# Patient Record
Sex: Male | Born: 2000 | Race: Black or African American | Hispanic: No | Marital: Single | Smoking: Never smoker
Health system: Southern US, Community
[De-identification: ages and names within clinical notes are randomized; demographics above are authoritative.]

## PROBLEM LIST (undated history)

## (undated) DIAGNOSIS — F988 Other specified behavioral and emotional disorders with onset usually occurring in childhood and adolescence: Secondary | ICD-10-CM

## (undated) DIAGNOSIS — G0490 Encephalitis and encephalomyelitis, unspecified: Secondary | ICD-10-CM

## (undated) HISTORY — PX: CIRCUMCISION: SUR203

---
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
## 2022-01-17 ENCOUNTER — Ambulatory Visit: Payer: Self-pay | Admitting: Internal Medicine

## 2022-11-15 ENCOUNTER — Encounter (HOSPITAL_COMMUNITY): Payer: Self-pay | Admitting: *Deleted

## 2022-11-15 ENCOUNTER — Ambulatory Visit (HOSPITAL_COMMUNITY)
Admission: EM | Admit: 2022-11-15 | Discharge: 2022-11-15 | Disposition: A | Discharge status: Home / Self Care | Payer: BC Managed Care – PPO | Source: Home / Self Care | Attending: Family Medicine | Admitting: Family Medicine

## 2022-11-15 DIAGNOSIS — R519 Headache, unspecified: Secondary | ICD-10-CM | POA: Insufficient documentation

## 2022-11-15 DIAGNOSIS — R4182 Altered mental status, unspecified: Secondary | ICD-10-CM | POA: Diagnosis not present

## 2022-11-15 DIAGNOSIS — G0481 Other encephalitis and encephalomyelitis: Secondary | ICD-10-CM | POA: Diagnosis not present

## 2022-11-15 DIAGNOSIS — R030 Elevated blood-pressure reading, without diagnosis of hypertension: Secondary | ICD-10-CM

## 2022-11-15 LAB — CBC
HCT: 49 % (ref 39.0–52.0)
Hemoglobin: 16.6 g/dL (ref 13.0–17.0)
MCH: 27 pg (ref 26.0–34.0)
MCHC: 33.9 g/dL (ref 30.0–36.0)
MCV: 79.8 fL — ABNORMAL LOW (ref 80.0–100.0)
Platelets: 364 10*3/uL (ref 150–400)
RBC: 6.14 MIL/uL — ABNORMAL HIGH (ref 4.22–5.81)
RDW: 12 % (ref 11.5–15.5)
WBC: 6 10*3/uL (ref 4.0–10.5)
nRBC: 0 % (ref 0.0–0.2)

## 2022-11-15 LAB — BASIC METABOLIC PANEL
Anion gap: 12 (ref 5–15)
BUN: 6 mg/dL (ref 6–20)
CO2: 27 mmol/L (ref 22–32)
Calcium: 9.7 mg/dL (ref 8.9–10.3)
Chloride: 100 mmol/L (ref 98–111)
Creatinine, Ser: 1.17 mg/dL (ref 0.61–1.24)
GFR, Estimated: >60 mL/min (ref >60)
Glucose, Bld: 137 mg/dL — ABNORMAL HIGH (ref 70–99)
Potassium: 3.2 mmol/L — ABNORMAL LOW (ref 3.5–5.1)
Sodium: 139 mmol/L (ref 135–145)

## 2022-11-15 NOTE — ED Triage Notes (Signed)
Pt states he has had a headache, blurry vision, and he can't get an erection since Monday. He has taken tylenol as needed. He states with tylenol headache is completely gone, which he did this morning.

## 2022-11-15 NOTE — ED Provider Notes (Signed)
[facility]    CSN: [id number] Arrival date & time: 11/15/22  4098      History   Chief Complaint Chief Complaint  Patient presents with   Headache   Eye Problem    HPI Colton Zhang is a 22 y.o. male.    Headache Eye Problem Associated symptoms: headaches    Here for headache and blurry vision.  He states the headache started on June 17.  This morning about 3 or 4 hours ago he took some Tylenol and it made the headache go away.  He had some associated nausea with it and it was on his right side of his head.  It was throbbing.  No photophobia  He has just seen his eye doctor 2 or 3 weeks ago for contacts, patient states that when the headache was going on the wood experiencing blurry vision when he tried to focus on something.    No vomiting or diarrhea.  He also notes at the end of the history that he has had trouble not able to get an erection in the last few days while the headache was going on.  Past Medical History:  Diagnosis Date   Asthma     Patient Active Problem List   Diagnosis Date Noted   Headache(784.0) 09/16/2013   Migraine without aura 09/16/2013   ADD (attention deficit disorder) 09/16/2013   Transient alteration of awareness 09/16/2013    Past Surgical History:  Procedure Laterality Date   CIRCUMCISION         Home Medications    Prior to Admission medications   Not on File    Family History Family History  Problem Relation Age of Onset   Cirrhosis Father    Migraines Brother    Heart failure Paternal Grandmother    Stroke Paternal Grandfather     Social History Social History   Tobacco Use   Smoking status: Never   Smokeless tobacco: Never  Vaping Use   Vaping Use: Never used  Substance Use Topics   Alcohol use: Never   Drug use: Never     Allergies   Other   Review of Systems Review of Systems  Neurological:  Positive for headaches.     Physical Exam Triage Vital Signs ED Triage Vitals   Enc Vitals Group     BP 11/15/22 1018 (!) 190/104     Pulse Rate 11/15/22 1018 93     Resp 11/15/22 1018 18     Temp 11/15/22 1018 98.5 F (36.9 C)     Temp Source 11/15/22 1018 Oral     SpO2 11/15/22 1018 96 %     Weight --      Height --      Head Circumference --      Peak Flow --      Pain Score 11/15/22 1016 0     Pain Loc --      Pain Edu? --      Excl. in GC? --    No data found.  Updated Vital Signs BP (!) 190/104 (BP Location: Left Arm)   Pulse 93   Temp 98.5 F (36.9 C) (Oral)   Resp 18   SpO2 96%   Visual Acuity Right Eye Distance: 20/25 Left Eye Distance: 20/30 Bilateral Distance: 20/30  Right Eye Near:   Left Eye Near:    Bilateral Near:     Physical Exam Vitals reviewed.  Constitutional:      General: He is  not in acute distress.    Appearance: He is not ill-appearing, toxic-appearing or diaphoretic.  HENT:     Nose: Nose normal.     Mouth/Throat:     Mouth: Mucous membranes are moist.     Pharynx: No oropharyngeal exudate or posterior oropharyngeal erythema.  Eyes:     Extraocular Movements: Extraocular movements intact.     Conjunctiva/sclera: Conjunctivae normal.     Pupils: Pupils are equal, round, and reactive to light.  Cardiovascular:     Rate and Rhythm: Normal rate and regular rhythm.     Heart sounds: No murmur heard. Pulmonary:     Effort: Pulmonary effort is normal. No respiratory distress.     Breath sounds: Normal breath sounds. No stridor. No wheezing, rhonchi or rales.  Musculoskeletal:     Cervical back: Neck supple.  Lymphadenopathy:     Cervical: No cervical adenopathy.  Skin:    Capillary Refill: Capillary refill takes less than 2 seconds.     Coloration: Skin is not jaundiced or pale.  Neurological:     General: No focal deficit present.     Mental Status: He is alert and oriented to person, place, and time.  Psychiatric:        Behavior: Behavior normal.      UC Treatments / Results  Labs (all labs ordered  are listed, but only abnormal results are displayed) Labs Reviewed - No data to display  EKG   Radiology No results found.  Procedures Procedures (including critical care time)  Medications Ordered in UC Medications - No data to display  Initial Impression / Assessment and Plan / UC Course  I have reviewed the triage vital signs and the nursing notes.  Pertinent labs & imaging results that were available during my care of the patient were reviewed by me and considered in my medical decision making (see chart for details).        CBC and BMP are drawn today.  Staff will notify him of any abnormal maladies that are significant.  He has had to change primary care offices due to insurance concerns, but has yet to make an appointment with a new primary care.  Of asked him to do that today so he can follow-up his blood pressure and these other complaints.  I did discuss with him that I think he perhaps had a migraine headache, but no further intervention for that is needed today since it is better.  He can check his blood pressure some at the store and keep a log of that. Final Clinical Impressions(s) / UC Diagnoses   Final diagnoses:  None   Discharge Instructions   None    ED Prescriptions   None    PDMP not reviewed this encounter.   Zenia Resides, MD 11/15/22 [phone]

## 2022-11-15 NOTE — Discharge Instructions (Signed)
You can continue to take Tylenol as needed for headache.  We have drawn blood to check your blood counts and your sugar, electrolytes, and kidney function.  Staff will notify you of anything significantly abnormal  You can check your blood pressure some at the pharmacy or store.  Normal blood pressure is less than 140/90.    Please make an appointment with your new primary care office sometime soon

## 2022-11-17 ENCOUNTER — Other Ambulatory Visit: Payer: Self-pay

## 2022-11-17 ENCOUNTER — Emergency Department (HOSPITAL_COMMUNITY): Payer: BC Managed Care – PPO

## 2022-11-17 ENCOUNTER — Inpatient Hospital Stay (HOSPITAL_COMMUNITY)
Admission: EM | Admit: 2022-11-17 | Discharge: 2022-11-27 | DRG: 097 | Disposition: A | Discharge status: Other Acute Inpatient Hospital | Payer: BC Managed Care – PPO | Source: Ambulatory Visit | Attending: Pulmonary Disease | Admitting: Pulmonary Disease

## 2022-11-17 ENCOUNTER — Encounter (HOSPITAL_COMMUNITY): Payer: Self-pay | Admitting: *Deleted

## 2022-11-17 ENCOUNTER — Ambulatory Visit (HOSPITAL_COMMUNITY)
Admission: EM | Admit: 2022-11-17 | Discharge: 2022-11-17 | Disposition: A | Discharge status: Home / Self Care | Payer: BC Managed Care – PPO | Attending: Internal Medicine | Admitting: Internal Medicine

## 2022-11-17 DIAGNOSIS — I160 Hypertensive urgency: Secondary | ICD-10-CM | POA: Diagnosis present

## 2022-11-17 DIAGNOSIS — R718 Other abnormality of red blood cells: Secondary | ICD-10-CM | POA: Diagnosis present

## 2022-11-17 DIAGNOSIS — Z823 Family history of stroke: Secondary | ICD-10-CM

## 2022-11-17 DIAGNOSIS — E870 Hyperosmolality and hypernatremia: Secondary | ICD-10-CM | POA: Diagnosis not present

## 2022-11-17 DIAGNOSIS — E8770 Fluid overload, unspecified: Secondary | ICD-10-CM | POA: Diagnosis not present

## 2022-11-17 DIAGNOSIS — T4275XA Adverse effect of unspecified antiepileptic and sedative-hypnotic drugs, initial encounter: Secondary | ICD-10-CM | POA: Diagnosis not present

## 2022-11-17 DIAGNOSIS — I952 Hypotension due to drugs: Secondary | ICD-10-CM | POA: Diagnosis not present

## 2022-11-17 DIAGNOSIS — R001 Bradycardia, unspecified: Secondary | ICD-10-CM | POA: Diagnosis not present

## 2022-11-17 DIAGNOSIS — G0481 Other encephalitis and encephalomyelitis: Principal | ICD-10-CM | POA: Diagnosis present

## 2022-11-17 DIAGNOSIS — R197 Diarrhea, unspecified: Secondary | ICD-10-CM | POA: Diagnosis not present

## 2022-11-17 DIAGNOSIS — N179 Acute kidney failure, unspecified: Secondary | ICD-10-CM | POA: Diagnosis not present

## 2022-11-17 DIAGNOSIS — F909 Attention-deficit hyperactivity disorder, unspecified type: Secondary | ICD-10-CM | POA: Diagnosis present

## 2022-11-17 DIAGNOSIS — J45909 Unspecified asthma, uncomplicated: Secondary | ICD-10-CM | POA: Diagnosis present

## 2022-11-17 DIAGNOSIS — G40901 Epilepsy, unspecified, not intractable, with status epilepticus: Secondary | ICD-10-CM | POA: Diagnosis present

## 2022-11-17 DIAGNOSIS — G40911 Epilepsy, unspecified, intractable, with status epilepticus: Secondary | ICD-10-CM | POA: Diagnosis present

## 2022-11-17 DIAGNOSIS — Z8249 Family history of ischemic heart disease and other diseases of the circulatory system: Secondary | ICD-10-CM

## 2022-11-17 DIAGNOSIS — L739 Follicular disorder, unspecified: Secondary | ICD-10-CM | POA: Diagnosis present

## 2022-11-17 DIAGNOSIS — J690 Pneumonitis due to inhalation of food and vomit: Secondary | ICD-10-CM | POA: Diagnosis not present

## 2022-11-17 DIAGNOSIS — R4789 Other speech disturbances: Secondary | ICD-10-CM | POA: Diagnosis not present

## 2022-11-17 DIAGNOSIS — Z79899 Other long term (current) drug therapy: Secondary | ICD-10-CM

## 2022-11-17 DIAGNOSIS — I10 Essential (primary) hypertension: Secondary | ICD-10-CM | POA: Diagnosis present

## 2022-11-17 DIAGNOSIS — E871 Hypo-osmolality and hyponatremia: Secondary | ICD-10-CM | POA: Diagnosis not present

## 2022-11-17 DIAGNOSIS — G43909 Migraine, unspecified, not intractable, without status migrainosus: Secondary | ICD-10-CM | POA: Diagnosis present

## 2022-11-17 DIAGNOSIS — J9601 Acute respiratory failure with hypoxia: Secondary | ICD-10-CM | POA: Diagnosis present

## 2022-11-17 DIAGNOSIS — A86 Unspecified viral encephalitis: Secondary | ICD-10-CM | POA: Diagnosis present

## 2022-11-17 DIAGNOSIS — G0490 Encephalitis and encephalomyelitis, unspecified: Principal | ICD-10-CM

## 2022-11-17 HISTORY — DX: Other specified behavioral and emotional disorders with onset usually occurring in childhood and adolescence: F98.8

## 2022-11-17 LAB — AMMONIA: Ammonia: 15 umol/L (ref 9–35)

## 2022-11-17 LAB — URINALYSIS, ROUTINE W REFLEX MICROSCOPIC
Bacteria, UA: NONE SEEN
Bilirubin Urine: NEGATIVE
Glucose, UA: NEGATIVE mg/dL
Hgb urine dipstick: NEGATIVE
Ketones, ur: NEGATIVE mg/dL
Leukocytes,Ua: NEGATIVE
Nitrite: NEGATIVE
Protein, ur: 100 mg/dL — AB
Specific Gravity, Urine: 1.012 (ref 1.005–1.030)
pH: 6 (ref 5.0–8.0)

## 2022-11-17 LAB — COMPREHENSIVE METABOLIC PANEL
ALT: 38 U/L (ref 0–44)
AST: 31 U/L (ref 15–41)
Albumin: 4.6 g/dL (ref 3.5–5.0)
Alkaline Phosphatase: 79 U/L (ref 38–126)
Anion gap: 14 (ref 5–15)
BUN: 11 mg/dL (ref 6–20)
CO2: 27 mmol/L (ref 22–32)
Calcium: 9.7 mg/dL (ref 8.9–10.3)
Chloride: 94 mmol/L — ABNORMAL LOW (ref 98–111)
Creatinine, Ser: 1.17 mg/dL (ref 0.61–1.24)
GFR, Estimated: >60 mL/min (ref >60)
Glucose, Bld: 132 mg/dL — ABNORMAL HIGH (ref 70–99)
Potassium: 3.7 mmol/L (ref 3.5–5.1)
Sodium: 135 mmol/L (ref 135–145)
Total Bilirubin: 0.8 mg/dL (ref 0.3–1.2)
Total Protein: 8.4 g/dL — ABNORMAL HIGH (ref 6.5–8.1)

## 2022-11-17 LAB — CBC
HCT: 51 % (ref 39.0–52.0)
Hemoglobin: 17 g/dL (ref 13.0–17.0)
MCH: 26.1 pg (ref 26.0–34.0)
MCHC: 33.3 g/dL (ref 30.0–36.0)
MCV: 78.2 fL — ABNORMAL LOW (ref 80.0–100.0)
Platelets: 380 10*3/uL (ref 150–400)
RBC: 6.52 MIL/uL — ABNORMAL HIGH (ref 4.22–5.81)
RDW: 11.6 % (ref 11.5–15.5)
WBC: 8.1 10*3/uL (ref 4.0–10.5)
nRBC: 0 % (ref 0.0–0.2)

## 2022-11-17 LAB — LIPASE, BLOOD: Lipase: 29 U/L (ref 11–51)

## 2022-11-17 LAB — POCT FASTING CBG KUC MANUAL ENTRY: POCT Glucose (KUC): 145 mg/dL — AB (ref 70–99)

## 2022-11-17 LAB — RAPID URINE DRUG SCREEN, HOSP PERFORMED
Amphetamines: NOT DETECTED
Barbiturates: NOT DETECTED
Benzodiazepines: NOT DETECTED
Cocaine: NOT DETECTED
Opiates: NOT DETECTED
Tetrahydrocannabinol: NOT DETECTED

## 2022-11-17 LAB — SALICYLATE LEVEL: Salicylate Lvl: <7 mg/dL — ABNORMAL LOW (ref 7.0–30.0)

## 2022-11-17 LAB — ACETAMINOPHEN LEVEL: Acetaminophen (Tylenol), Serum: <10 ug/mL — ABNORMAL LOW (ref 10–30)

## 2022-11-17 LAB — CBG MONITORING, ED: Glucose-Capillary: 122 mg/dL — ABNORMAL HIGH (ref 70–99)

## 2022-11-17 LAB — TSH: TSH: 1.504 u[IU]/mL (ref 0.350–4.500)

## 2022-11-17 MED ORDER — LORAZEPAM 2 MG/ML IJ SOLN
0.5000 mg | Freq: Once | INTRAMUSCULAR | Status: AC
  Administered 2022-11-17: 0.5 mg via INTRAVENOUS
  Filled 2022-11-17: qty 1

## 2022-11-17 MED ORDER — LIDOCAINE-EPINEPHRINE (PF) 2 %-1:200000 IJ SOLN
20.0000 mL | Freq: Once | INTRAMUSCULAR | Status: AC
  Administered 2022-11-18: 20 mL via INTRADERMAL
  Filled 2022-11-17: qty 20

## 2022-11-17 MED ORDER — DEXTROSE 5 % IV SOLN
500.0000 mg | Freq: Once | INTRAVENOUS | Status: AC
  Administered 2022-11-18: 500 mg via INTRAVENOUS
  Filled 2022-11-17: qty 10

## 2022-11-17 MED ORDER — VANCOMYCIN HCL 1500 MG/300ML IV SOLN
1500.0000 mg | Freq: Once | INTRAVENOUS | Status: AC
  Administered 2022-11-17: 1500 mg via INTRAVENOUS
  Filled 2022-11-17: qty 300

## 2022-11-17 MED ORDER — LORAZEPAM 2 MG/ML IJ SOLN
0.5000 mg | Freq: Once | INTRAMUSCULAR | Status: AC
  Administered 2022-11-18: 0.5 mg via INTRAVENOUS
  Filled 2022-11-17: qty 1

## 2022-11-17 MED ORDER — SODIUM CHLORIDE 0.9 % IV SOLN
2.0000 g | Freq: Two times a day (BID) | INTRAVENOUS | Status: DC
  Administered 2022-11-17: 2 g via INTRAVENOUS
  Filled 2022-11-17 (×2): qty 20

## 2022-11-17 MED ORDER — GADOBUTROL 1 MMOL/ML IV SOLN
7.5000 mL | Freq: Once | INTRAVENOUS | Status: AC | PRN
  Administered 2022-11-17: 7.5 mL via INTRAVENOUS

## 2022-11-17 MED ORDER — PROCHLORPERAZINE EDISYLATE 10 MG/2ML IJ SOLN
5.0000 mg | Freq: Once | INTRAMUSCULAR | Status: AC
  Administered 2022-11-17: 5 mg via INTRAVENOUS
  Filled 2022-11-17: qty 2

## 2022-11-17 MED ORDER — SODIUM CHLORIDE 0.9 % IV BOLUS
500.0000 mL | Freq: Once | INTRAVENOUS | Status: AC
  Administered 2022-11-17: 500 mL via INTRAVENOUS

## 2022-11-17 MED ORDER — DEXTROSE 5 % IV SOLN
500.0000 mg | Freq: Three times a day (TID) | INTRAVENOUS | Status: DC
  Filled 2022-11-17 (×5): qty 10

## 2022-11-17 NOTE — ED Triage Notes (Signed)
The pt thinks he's hving a stroke because his bp is high and he reports that he has been stuttering  a few minutes later the pt began to look staright at me and starts mumbling  after approx 15 seconds he clearly states I'm ok now with normal speech  no headache

## 2022-11-17 NOTE — ED Notes (Signed)
I found pt walking down hall to the lobby with unsteady gait. Pt walked into wall and said his vision was blurry. Pt altered and said he did not know why he was walking down the hall or where he was going when I asked. I brought pt back to bed. Family at bedside. Pt's nurse notified.

## 2022-11-17 NOTE — ED Notes (Signed)
Patient is being discharged from the Urgent Care and sent to the Emergency Department via POV . Per Juliet Rude, patient is in need of higher level of care due to Altered mental status. Patient is aware and verbalizes understanding of plan of care.  Vitals:   11/17/22 1524  BP: (!) 174/110  Pulse: 94  Resp: 18  Temp: 98.6 F (37 C)  SpO2: 97%

## 2022-11-17 NOTE — ED Provider Notes (Signed)
[place] EM[facility]GENCY DEPARTMENT AT [facility] Provider Note   CSN: [id number] Arrival date & time: 11/17/22  1620     History {Add pertinent medical, surgical, social history, OB history to HPI:1} Chief Complaint  Patient presents with   he thinks hes having a stroke   Hypertension from [facility]    Devarius Nelles is a 22 y.o. male with a history of chronic headaches who presents emergency department with altered mental status.  History is gathered at bedside from the patient's brother and godparents who are with him.  He was seen earlier today at the urgent care and sent in for further evaluation.  His brother reports that he has been "dealing with high blood pressure issues" this week.  His godparents are concerned because he is confused.  They state that "he did not even recognize [place]."  The nurse reports that the patient was up and was trying to walk out of the ER but did not know where he was and was redirected back to his bed.  His brother reports that he does not use drugs.  He has not been hallucinating or altered at home.  Patient reports that he has had worsening and increased headaches over the past few weeks and this past Monday he had a severe throbbing headache with associated nausea on the 21st for which he was seen at an urgent care.  Mother reports that he has been complaining of blurry vision and this is the first time that he has been confused.  He has not shown any other abnormal neurologic deficits such as ataxia dizziness or stumbling.  He has not exhibited any facial droop or unilateral weakness.  His brother reports that he was having some stuttering speech earlier today.  Reviewed the urgent care visit.  Neurology was consulted and states that he does not meet qualifications for a code stroke.  HPI     Home Medications Prior to Admission medications   Not on File      Allergies    Other    Review of Systems   Review of Systems  Physical Exam Updated  Vital Signs BP (!) 171/117 (BP Location: Right Arm)   Pulse 97   Temp 99.9 F (37.7 C) (Oral)   Resp 17   Ht 5' (1.524 m)   Wt 75.6 kg   SpO2 100%   BMI 32.55 kg/m  Physical Exam Vitals and nursing note reviewed.  Constitutional:      General: He is not in acute distress.    Appearance: He is well-developed. He is not diaphoretic.  HENT:     Head: Normocephalic and atraumatic.  Eyes:     General: No scleral icterus.    Conjunctiva/sclera: Conjunctivae normal.  Cardiovascular:     Rate and Rhythm: Normal rate and regular rhythm.     Heart sounds: Normal heart sounds.  Pulmonary:     Effort: Pulmonary effort is normal. No respiratory distress.     Breath sounds: Normal breath sounds.  Abdominal:     Palpations: Abdomen is soft.     Tenderness: There is no abdominal tenderness.  Musculoskeletal:     Cervical back: Normal range of motion and neck supple.  Skin:    General: Skin is warm and dry.  Neurological:     Mental Status: He is alert.     GCS: GCS eye subscore is 4. GCS verbal subscore is 5. GCS motor subscore is 6.     Sensory: Sensation  is intact.     Motor: Motor function is intact.     Coordination: Coordination is intact.     Gait: Gait is intact.     Deep Tendon Reflexes: Reflexes are normal and symmetric.     Comments: Patient initially states that the year is 2024 but then was able to state that it was 2024.  No other disorientation  Psychiatric:        Behavior: Behavior normal.     ED Results / Procedures / Treatments   Labs (all labs ordered are listed, but only abnormal results are displayed) Labs Reviewed  COMPREHENSIVE METABOLIC PANEL - Abnormal; Notable for the following components:      Result Value   Chloride 94 (*)    Glucose, Bld 132 (*)    Total Protein 8.4 (*)    All other components within normal limits  CBC - Abnormal; Notable for the following components:   RBC 6.52 (*)    MCV 78.2 (*)    All other components within normal limits   URINALYSIS, ROUTINE W REFLEX MICROSCOPIC - Abnormal; Notable for the following components:   Protein, ur 100 (*)    All other components within normal limits  LIPASE, BLOOD  AMMONIA  RAPID URINE DRUG SCREEN, HOSP PERFORMED  ACETAMINOPHEN LEVEL  SALICYLATE LEVEL  TSH  CBG MONITORING, ED    EKG EKG Interpretation  Date/Time:  Sunday November 17 2022 16:28:05 EDT Ventricular Rate:  100 PR Interval:  128 QRS Duration: 76 QT Interval:  322 QTC Calculation: 415 R Axis:   70 Text Interpretation: Normal sinus rhythm Right atrial enlargement Borderline ECG When compared with ECG of 17-Nov-2022 15:34, No significant change since last tracing Confirmed by Linwood Dibbles [phone]) on 11/17/2022 5:48:33 PM  Radiology No results found.  Procedures Procedures  {Document cardiac monitor, telemetry assessment procedure when appropriate:1}  Medications Ordered in ED Medications - No data to display  ED Course/ Medical Decision Making/ A&P Clinical Course as of 11/17/22 1823  Sun Nov 17, 2022  1822 Glucose(!): 132 [AH]    Clinical Course User Index [AH] Arthor Captain, PA-C   {   Click here for ABCD2, HEART and other calculatorsREFRESH Note before signing :1}                          Medical Decision Making Amount and/or Complexity of Data Reviewed Labs: ordered. Decision-making details documented in ED Course. Radiology: ordered.  Risk Prescription drug management.   ***  {Document critical care time when appropriate:1} {Document review of labs and clinical decision tools ie heart score, Chads2Vasc2 etc:1}  {Document your independent review of radiology images, and any outside records:1} {Document your discussion with family members, caretakers, and with consultants:1} {Document social determinants of health affecting pt's care:1} {Document your decision making why or why not admission, treatments were needed:1} Final Clinical Impression(s) / ED Diagnoses Final diagnoses:  None     Rx / DC Orders ED Discharge Orders     None

## 2022-11-17 NOTE — ED Notes (Signed)
Pt given urine cup stated "if I can't get water, I can't provide urine".

## 2022-11-17 NOTE — ED Triage Notes (Signed)
The pt rep[orts that he thinks he's having a stroke  his bp is high  [facility] and was told to come here for tyreatmnet   he does not take med for hypertension

## 2022-11-17 NOTE — ED Provider Notes (Signed)
Patient presents to urgent care with brother for evaluation of intermittent chest pain, shortness of breath, headache, numbness/tingling to the right arm, weakness of the right arm, and blurry vision for the last 1 week.  No trauma/injuries to the head. He was seen 2 days ago at urgent care for headache where blood work was normal and he was advised to return to the ER if any worsening symptoms. 1 hour ago, he started having some changes in his speech and stuttering that brother states is new for him.  Brother also states he has had some changes in gait over the last 24 hours. Denies recent drug use, non-smoker.  Reports intermittent dizziness with position changes and standing.  He is not currently experiencing any nausea, vomiting, chest pain, heart palpitations, shortness of breath, abdominal pain, vision changes, tinnitus, arm weakness, or lightheadedness.  No recent syncopal episodes.  States his paternal grandfather and one of his paternal uncles died of stroke at a young age (61 and 62s respectively).  Patient has had high blood pressure readings for the last 1 week without previous medical history of hypertension.  Evaluated patient in triage. Initially, patient mumbling and having some stuttering when attempting to speak.  This quickly cleared up and patient able to communicate appropriately for interview. Neurologic exam is nonfocal.  Negative Romberg, no ataxia to the upper/lower extremities bilaterally, HEENT exam is stable.  Cranial nerves intact.   Spoke with Dr. Christia Reading neurologist for recommendations regarding course of care. Dr. Christia Reading does not recommend code stroke at this time. I would like for patient to go to the nearest ER for further workup and evaluation as symptoms are concerning for acute intracranial abnormality. Discussed risks of deferring ED visit. Brother and patient verbalize understanding and agreement with plan. He is stable for transport to ED via personal vehicle with  brother.    Carlisle Beers, [place] 11/17/22 1625

## 2022-11-17 NOTE — ED Notes (Signed)
Patient transported to MRI 

## 2022-11-17 NOTE — Discharge Instructions (Signed)
Please go to the ER for further workup and evaluation

## 2022-11-17 NOTE — ED Notes (Signed)
Patient transported to CT 

## 2022-11-17 NOTE — ED Triage Notes (Signed)
Blurred vision and confusion, headaches onset 1 week ago.  AO x 4 no weakness, drooping of the face, or slurred speech.   Patient has felt faint. Hard to connect his thoughts. Patient having SOB and chest pain.

## 2022-11-18 ENCOUNTER — Inpatient Hospital Stay (HOSPITAL_COMMUNITY): Payer: BC Managed Care – PPO

## 2022-11-18 DIAGNOSIS — G40109 Localization-related (focal) (partial) symptomatic epilepsy and epileptic syndromes with simple partial seizures, not intractable, without status epilepticus: Secondary | ICD-10-CM | POA: Diagnosis not present

## 2022-11-18 DIAGNOSIS — E870 Hyperosmolality and hypernatremia: Secondary | ICD-10-CM | POA: Diagnosis not present

## 2022-11-18 DIAGNOSIS — R569 Unspecified convulsions: Secondary | ICD-10-CM

## 2022-11-18 DIAGNOSIS — E871 Hypo-osmolality and hyponatremia: Secondary | ICD-10-CM | POA: Diagnosis not present

## 2022-11-18 DIAGNOSIS — R609 Edema, unspecified: Secondary | ICD-10-CM | POA: Diagnosis not present

## 2022-11-18 DIAGNOSIS — R4182 Altered mental status, unspecified: Secondary | ICD-10-CM | POA: Diagnosis present

## 2022-11-18 DIAGNOSIS — R718 Other abnormality of red blood cells: Secondary | ICD-10-CM | POA: Diagnosis present

## 2022-11-18 DIAGNOSIS — N179 Acute kidney failure, unspecified: Secondary | ICD-10-CM | POA: Diagnosis not present

## 2022-11-18 DIAGNOSIS — G43909 Migraine, unspecified, not intractable, without status migrainosus: Secondary | ICD-10-CM | POA: Diagnosis present

## 2022-11-18 DIAGNOSIS — G9340 Encephalopathy, unspecified: Secondary | ICD-10-CM | POA: Diagnosis not present

## 2022-11-18 DIAGNOSIS — Z8249 Family history of ischemic heart disease and other diseases of the circulatory system: Secondary | ICD-10-CM | POA: Diagnosis not present

## 2022-11-18 DIAGNOSIS — J690 Pneumonitis due to inhalation of food and vomit: Secondary | ICD-10-CM | POA: Diagnosis not present

## 2022-11-18 DIAGNOSIS — G0481 Other encephalitis and encephalomyelitis: Secondary | ICD-10-CM | POA: Diagnosis present

## 2022-11-18 DIAGNOSIS — R404 Transient alteration of awareness: Secondary | ICD-10-CM | POA: Diagnosis not present

## 2022-11-18 DIAGNOSIS — J9601 Acute respiratory failure with hypoxia: Secondary | ICD-10-CM | POA: Diagnosis not present

## 2022-11-18 DIAGNOSIS — L739 Follicular disorder, unspecified: Secondary | ICD-10-CM | POA: Diagnosis present

## 2022-11-18 DIAGNOSIS — Z79899 Other long term (current) drug therapy: Secondary | ICD-10-CM | POA: Diagnosis not present

## 2022-11-18 DIAGNOSIS — R001 Bradycardia, unspecified: Secondary | ICD-10-CM | POA: Diagnosis not present

## 2022-11-18 DIAGNOSIS — I10 Essential (primary) hypertension: Secondary | ICD-10-CM | POA: Diagnosis present

## 2022-11-18 DIAGNOSIS — A86 Unspecified viral encephalitis: Secondary | ICD-10-CM | POA: Diagnosis not present

## 2022-11-18 DIAGNOSIS — E8770 Fluid overload, unspecified: Secondary | ICD-10-CM | POA: Diagnosis not present

## 2022-11-18 DIAGNOSIS — J45909 Unspecified asthma, uncomplicated: Secondary | ICD-10-CM | POA: Diagnosis not present

## 2022-11-18 DIAGNOSIS — I952 Hypotension due to drugs: Secondary | ICD-10-CM | POA: Diagnosis not present

## 2022-11-18 DIAGNOSIS — Z823 Family history of stroke: Secondary | ICD-10-CM | POA: Diagnosis not present

## 2022-11-18 DIAGNOSIS — G0490 Encephalitis and encephalomyelitis, unspecified: Secondary | ICD-10-CM | POA: Diagnosis not present

## 2022-11-18 DIAGNOSIS — G9341 Metabolic encephalopathy: Secondary | ICD-10-CM | POA: Diagnosis not present

## 2022-11-18 DIAGNOSIS — G40911 Epilepsy, unspecified, intractable, with status epilepticus: Secondary | ICD-10-CM | POA: Diagnosis present

## 2022-11-18 DIAGNOSIS — T4275XA Adverse effect of unspecified antiepileptic and sedative-hypnotic drugs, initial encounter: Secondary | ICD-10-CM | POA: Diagnosis not present

## 2022-11-18 DIAGNOSIS — F909 Attention-deficit hyperactivity disorder, unspecified type: Secondary | ICD-10-CM | POA: Diagnosis present

## 2022-11-18 DIAGNOSIS — T17908A Unspecified foreign body in respiratory tract, part unspecified causing other injury, initial encounter: Secondary | ICD-10-CM | POA: Diagnosis not present

## 2022-11-18 DIAGNOSIS — I160 Hypertensive urgency: Secondary | ICD-10-CM | POA: Diagnosis present

## 2022-11-18 DIAGNOSIS — R197 Diarrhea, unspecified: Secondary | ICD-10-CM | POA: Diagnosis not present

## 2022-11-18 LAB — SEDIMENTATION RATE: Sed Rate: 2 mm/hr (ref 0–16)

## 2022-11-18 LAB — MENINGITIS/ENCEPHALITIS PANEL (CSF)

## 2022-11-18 LAB — CBC
HCT: 48 % (ref 39.0–52.0)
Hemoglobin: 16.3 g/dL (ref 13.0–17.0)
MCH: 27.2 pg (ref 26.0–34.0)
MCHC: 34 g/dL (ref 30.0–36.0)
MCV: 80 fL (ref 80.0–100.0)
Platelets: 327 10*3/uL (ref 150–400)
RBC: 6 MIL/uL — ABNORMAL HIGH (ref 4.22–5.81)
RDW: 11.9 % (ref 11.5–15.5)
WBC: 8.6 10*3/uL (ref 4.0–10.5)
nRBC: 0 % (ref 0.0–0.2)

## 2022-11-18 LAB — BASIC METABOLIC PANEL
Anion gap: 11 (ref 5–15)
BUN: 12 mg/dL (ref 6–20)
CO2: 25 mmol/L (ref 22–32)
Calcium: 9.3 mg/dL (ref 8.9–10.3)
Chloride: 98 mmol/L (ref 98–111)
Creatinine, Ser: 1.08 mg/dL (ref 0.61–1.24)
GFR, Estimated: >60 mL/min (ref >60)
Glucose, Bld: 106 mg/dL — ABNORMAL HIGH (ref 70–99)
Potassium: 3.8 mmol/L (ref 3.5–5.1)
Sodium: 134 mmol/L — ABNORMAL LOW (ref 135–145)

## 2022-11-18 LAB — CSF CELL COUNT WITH DIFFERENTIAL
RBC Count, CSF: 643 /mm3 — ABNORMAL HIGH
Tube #: 3
WBC, CSF: 7 /mm3 — ABNORMAL HIGH (ref 0–5)

## 2022-11-18 LAB — PROTEIN AND GLUCOSE, CSF
Glucose, CSF: 80 mg/dL — ABNORMAL HIGH (ref 40–70)
Total  Protein, CSF: 18 mg/dL (ref 15–45)

## 2022-11-18 LAB — HIV ANTIBODY (ROUTINE TESTING W REFLEX): HIV Screen 4th Generation wRfx: NONREACTIVE

## 2022-11-18 LAB — C-REACTIVE PROTEIN: CRP: <0.5 mg/dL (ref <1.0)

## 2022-11-18 MED ORDER — METOPROLOL TARTRATE 5 MG/5ML IV SOLN
5.0000 mg | Freq: Once | INTRAVENOUS | Status: AC
  Administered 2022-11-18: 5 mg via INTRAVENOUS
  Filled 2022-11-18: qty 5

## 2022-11-18 MED ORDER — LORAZEPAM 2 MG/ML IJ SOLN
2.0000 mg | INTRAMUSCULAR | Status: AC
  Administered 2022-11-18: 2 mg via INTRAVENOUS

## 2022-11-18 MED ORDER — DEXTROSE 5 % IV SOLN
750.0000 mg | Freq: Three times a day (TID) | INTRAVENOUS | Status: DC
  Administered 2022-11-18: 750 mg via INTRAVENOUS
  Filled 2022-11-18 (×3): qty 15

## 2022-11-18 MED ORDER — SODIUM CHLORIDE 0.9 % IV SOLN: 4000.0000 mg | Freq: Once | INTRAVENOUS | Status: DC

## 2022-11-18 MED ORDER — ACETAMINOPHEN 650 MG RE SUPP: 650.0000 mg | Freq: Four times a day (QID) | RECTAL | Status: DC | PRN

## 2022-11-18 MED ORDER — TRAZODONE HCL 50 MG PO TABS: 25.0000 mg | ORAL_TABLET | Freq: Every evening | ORAL | Status: DC | PRN

## 2022-11-18 MED ORDER — LEVETIRACETAM IN NACL 1500 MG/100ML IV SOLN
1500.0000 mg | Freq: Once | INTRAVENOUS | Status: AC
  Administered 2022-11-18: 1500 mg via INTRAVENOUS
  Filled 2022-11-18: qty 100

## 2022-11-18 MED ORDER — SODIUM CHLORIDE 0.9 % IV SOLN: INTRAVENOUS | Status: DC

## 2022-11-18 MED ORDER — LORAZEPAM 2 MG/ML IJ SOLN
INTRAMUSCULAR | Status: AC
  Filled 2022-11-18: qty 1

## 2022-11-18 MED ORDER — HYDRALAZINE HCL 20 MG/ML IJ SOLN
10.0000 mg | Freq: Four times a day (QID) | INTRAMUSCULAR | Status: DC | PRN
  Administered 2022-11-18 (×2): 10 mg via INTRAVENOUS
  Filled 2022-11-18 (×2): qty 1

## 2022-11-18 MED ORDER — ONDANSETRON HCL 4 MG/2ML IJ SOLN: 4.0000 mg | Freq: Four times a day (QID) | INTRAMUSCULAR | Status: DC | PRN

## 2022-11-18 MED ORDER — LABETALOL HCL 5 MG/ML IV SOLN
20.0000 mg | INTRAVENOUS | Status: DC | PRN
  Administered 2022-11-18 – 2022-11-19 (×4): 20 mg via INTRAVENOUS
  Filled 2022-11-18 (×4): qty 4

## 2022-11-18 MED ORDER — ENOXAPARIN SODIUM 40 MG/0.4ML IJ SOSY
40.0000 mg | PREFILLED_SYRINGE | Freq: Every day | INTRAMUSCULAR | Status: DC
  Administered 2022-11-18 – 2022-11-27 (×10): 40 mg via SUBCUTANEOUS
  Filled 2022-11-18 (×10): qty 0.4

## 2022-11-18 MED ORDER — VALPROATE SODIUM 100 MG/ML IV SOLN
500.0000 mg | Freq: Three times a day (TID) | INTRAVENOUS | Status: DC
  Administered 2022-11-19 – 2022-11-21 (×8): 500 mg via INTRAVENOUS
  Filled 2022-11-18 (×2): qty 5
  Filled 2022-11-18: qty 500
  Filled 2022-11-18 (×2): qty 5
  Filled 2022-11-18: qty 500
  Filled 2022-11-18 (×4): qty 5

## 2022-11-18 MED ORDER — LEVETIRACETAM IN NACL 1000 MG/100ML IV SOLN
1000.0000 mg | Freq: Once | INTRAVENOUS | Status: AC
  Administered 2022-11-18: 1000 mg via INTRAVENOUS
  Filled 2022-11-18: qty 100

## 2022-11-18 MED ORDER — CLONIDINE HCL 0.1 MG/24HR TD PTWK
0.1000 mg | MEDICATED_PATCH | TRANSDERMAL | Status: DC
  Administered 2022-11-18: 0.1 mg via TRANSDERMAL
  Filled 2022-11-18: qty 1

## 2022-11-18 MED ORDER — ONDANSETRON HCL 4 MG PO TABS: 4.0000 mg | ORAL_TABLET | Freq: Four times a day (QID) | ORAL | Status: DC | PRN

## 2022-11-18 MED ORDER — MAGNESIUM HYDROXIDE 400 MG/5ML PO SUSP: 30.0000 mL | Freq: Every day | ORAL | Status: DC | PRN

## 2022-11-18 MED ORDER — ACETAMINOPHEN 325 MG PO TABS
650.0000 mg | ORAL_TABLET | Freq: Four times a day (QID) | ORAL | Status: DC | PRN
  Administered 2022-11-18: 650 mg via ORAL
  Filled 2022-11-18 (×3): qty 2

## 2022-11-18 MED ORDER — SODIUM CHLORIDE 0.9 % IV SOLN: 2.0000 g | Freq: Two times a day (BID) | INTRAVENOUS | Status: DC

## 2022-11-18 MED ORDER — VANCOMYCIN HCL IN DEXTROSE 1-5 GM/200ML-% IV SOLN
1000.0000 mg | Freq: Three times a day (TID) | INTRAVENOUS | Status: DC
  Administered 2022-11-18: 1000 mg via INTRAVENOUS
  Filled 2022-11-18: qty 200

## 2022-11-18 MED ORDER — SODIUM CHLORIDE 0.9 % IV SOLN
1000.0000 mg | INTRAVENOUS | Status: AC
  Administered 2022-11-18 – 2022-11-22 (×5): 1000 mg via INTRAVENOUS
  Filled 2022-11-18 (×5): qty 16

## 2022-11-18 MED ORDER — DEXTROSE 5 % IV SOLN: 10.0000 mg/kg | Freq: Three times a day (TID) | INTRAVENOUS | Status: DC

## 2022-11-18 MED ORDER — VALPROATE SODIUM 100 MG/ML IV SOLN
2500.0000 mg | Freq: Once | INTRAVENOUS | Status: AC
  Administered 2022-11-18: 2500 mg via INTRAVENOUS
  Filled 2022-11-18: qty 25

## 2022-11-18 MED ORDER — IOHEXOL 350 MG/ML SOLN
75.0000 mL | Freq: Once | INTRAVENOUS | Status: AC | PRN
  Administered 2022-11-18: 75 mL via INTRAVENOUS

## 2022-11-18 NOTE — Progress Notes (Signed)
Pharmacy Antimicrobial Note  Colton Zhang is a 22 y.o. male admitted on 11/17/2022 with concern for meningitis.  Pharmacy has been consulted for vancomycin, ceftriaxone, and acyclovir dosing.  Plan: Vancomycin 1500mg  x1 then 1000mg  IV Q8H.  Goal trough 15-20 mcg/mL. Ceftriaxone 2g IV Q12H. Acyclovir 750mg  IV Q8H.  Height: 5\' 11"  (180.3 cm) Weight: 90.7 kg (200 lb) IBW/kg (Calculated) : 75.3  Temp (24hrs), Avg:99 F (37.2 C), Min:98.6 F (37 C), Max:99.9 F (37.7 C)  Recent Labs  Lab 11/15/22 1041 11/17/22 1638  WBC 6.0 8.1  CREATININE 1.17 1.17    Estimated Creatinine Clearance: 116.1 mL/min (by C-G formula based on SCr of 1.17 mg/dL).    Allergies  Allergen Reactions   Other     Seasonal Allergies    Thank you for allowing pharmacy to be a part of this patient's care.  Vernard Gambles, PharmD, BCPS  11/18/2022 12:08 AM

## 2022-11-18 NOTE — ED Notes (Signed)
Left message for EEG asking how to transport with EEG attached.

## 2022-11-18 NOTE — ED Notes (Signed)
This paramedic entered room and found patient to be staring off. When this paramedic spoke his name, he looked into my eyes. He was not able to respond to any questions or follow any commands. Pt was also showing sinus tach and BP 185/123.  The patient quickly came to and was able to speak to this paramedic and was Aaox4. He was then able to follow commands.  A minute later, pt was unable to speak again.  1435 Pt asked for urinal and was back to baseline.

## 2022-11-18 NOTE — ED Notes (Signed)
Condom cath applied

## 2022-11-18 NOTE — Progress Notes (Signed)
LTM EEG hooked up and running. [facility] not monitoring yet due to being in the ED. Leads are CT leads only.

## 2022-11-18 NOTE — H&P (Addendum)
[place]   PATIENT NAME: Colton Zhang    MR#:  [id number]  DATE OF BIRTH:  Nov 09, 2000  DATE OF ADMISSION:  11/17/2022  PRIMARY CARE PHYSICIAN: Pcp, No   Patient is coming from: Home  REQUESTING/REFERRING PHYSICIAN: Arthor Captain, PA-C  CHIEF COMPLAINT:   Chief Complaint  Patient presents with   he thinks hes having a stroke   Hypertension from [facility]    HISTORY OF PRESENT ILLNESS:  Colton Zhang is a 22 y.o. African-American male with medical history significant for asthma who presented to the emergency room with acute onset of altered mental status with confusion that started today.  He stated that he vomited yesterday with no bilious vomitus or hematemesis.  He has been feeling weak all over.  He was noted to repeat nonsensical babbling, per ED provider.Marland Kitchen  He did not know where he was.  He has been having elevated blood pressure all week.  No neck pain or stiffness.  No diarrhea or abdominal pain.  He has chronic aches but denies any acute headache or dizziness or blurred vision.  No paresthesias or focal muscle weakness.  He denied any cough or wheezing or dyspnea.  No rhinorrhea or nasal congestion or sore throat or earache.  No dysuria, oliguria or hematuria or flank pain.  No bleeding diathesis.  ED Course: When the patient came to the ER, chloride was 94 and blood glucose 132, total protein of 8.4 with otherwise unremarkable CMP.  Ammonia level was 15.  CBC was within normal with mild microcytosis.  Tylenol level was less than 10 and salicylate less than 7.  TSH was 1.5.  UA was negative and urine drug screen came back negative.  LP fluid analysis showed 80 glucose, 643 RBCs, 7 WBCs and 18 protein. EKG as reviewed by me : EKG showed normal sinus rhythm with a rate of 96 with sinus arrhythmia Imaging: Two-view chest x-ray showed no acute cardiopulmonary disease.  Noncontrasted head CT scan revealed no acute intracranial abnormalities.  Brain MRI revealed multifocal cortical  edema within both hemispheres but mostly on the right.  While nonspecific this is most concerning for viral encephalitis.  The patient was given empiric therapy with IV acyclovir, vancomycin and Rocephin as well as IV Reglan and Ativan.  He will be admitted to a progressive unit bed for further evaluation and management. PAST MEDICAL HISTORY:   Past Medical History:  Diagnosis Date   Asthma     PAST SURGICAL HISTORY:   Past Surgical History:  Procedure Laterality Date   CIRCUMCISION      SOCIAL HISTORY:   Social History   Tobacco Use   Smoking status: Never   Smokeless tobacco: Never  Substance Use Topics   Alcohol use: Never    FAMILY HISTORY:   Family History  Problem Relation Age of Onset   Cirrhosis Father    Migraines Brother    Heart failure Paternal Grandmother    Stroke Paternal Grandfather     DRUG ALLERGIES:   Allergies  Allergen Reactions   Other     Seasonal Allergies    REVIEW OF SYSTEMS:   ROS As per history of present illness. All pertinent systems were reviewed above. Constitutional, HEENT, cardiovascular, respiratory, GI, GU, musculoskeletal, neuro, psychiatric, endocrine, integumentary and hematologic systems were reviewed and are otherwise negative/unremarkable except for positive findings mentioned above in the HPI.   MEDICATIONS AT HOME:   Prior to Admission medications   Not on File  VITAL SIGNS:  Blood pressure (!) 172/110, pulse (!) 128, temperature 98.9 F (37.2 C), temperature source Oral, resp. rate (!) 22, height 5\' 11"  (1.803 m), weight 90.7 kg, SpO2 97 %.  PHYSICAL EXAMINATION:  Physical Exam  GENERAL:  22 y.o.-year-old patient lying in the bed with no acute distress.  EYES: Pupils equal, round, reactive to light and accommodation. No scleral icterus. Extraocular muscles intact.  HEENT: Head atraumatic, normocephalic. Oropharynx and nasopharynx clear.  NECK:  Supple, no jugular venous distention. No thyroid  enlargement, no tenderness.  LUNGS: Normal breath sounds bilaterally, no wheezing, rales,rhonchi or crepitation. No use of accessory muscles of respiration.  CARDIOVASCULAR: Regular rate and rhythm, S1, S2 normal. No murmurs, rubs, or gallops.  ABDOMEN: Soft, nondistended, nontender. Bowel sounds present. No organomegaly or mass.  EXTREMITIES: No pedal edema, cyanosis, or clubbing.  NEUROLOGIC: Cranial nerves II through XII are intact. Muscle strength 5/5 in all extremities. Sensation intact. Gait not checked.  PSYCHIATRIC: The patient is alert and oriented x 3.  Normal affect and good eye contact. SKIN: No obvious rash, lesion, or ulcer.   LABORATORY PANEL:   CBC Recent Labs  Lab 11/17/22 1638  WBC 8.1  HGB 17.0  HCT 51.0  PLT 380   ------------------------------------------------------------------------------------------------------------------  Chemistries  Recent Labs  Lab 11/17/22 1638  NA 135  K 3.7  CL 94*  CO2 27  GLUCOSE 132*  BUN 11  CREATININE 1.17  CALCIUM 9.7  AST 31  ALT 38  ALKPHOS 79  BILITOT 0.8   ------------------------------------------------------------------------------------------------------------------  Cardiac Enzymes No results for input(s): "TROPONINI" in the last 168 hours. ------------------------------------------------------------------------------------------------------------------  RADIOLOGY:  MR BRAIN W WO CONTRAST  Result Date: 11/17/2022 CLINICAL DATA:  Slurred speech EXAM: MRI HEAD WITHOUT AND WITH CONTRAST TECHNIQUE: Multiplanar, multiecho pulse sequences of the brain and surrounding structures were obtained without and with intravenous contrast. CONTRAST:  7.67mL GADAVIST GADOBUTROL 1 MMOL/ML IV SOLN COMPARISON:  None Available. FINDINGS: Brain: There is multifocal cortical edema within both hemispheres, but mostly on the right. Locations are annotated on the FLAIR sequence. Locations include the right insula left, right  cingulate gyrus and posterior right temporal cortex, others. There is no associated contrast enhancement. No acute hemorrhage or acute infarct. No hydrocephalus. Vascular: Normal flow voids. Skull and upper cervical spine: Normal marrow signal. Sinuses/Orbits: Negative. Other: None. IMPRESSION: Multifocal cortical edema within both hemispheres, but mostly on the right. While nonspecific, this is most concerning for viral encephalitis. CSF sampling should be considered. Electronically Signed   By: Deatra Robinson M.D.   On: 11/17/2022 22:24   DG Chest 2 View  Result Date: 11/17/2022 CLINICAL DATA:  Altered mental status EXAM: CHEST - 2 VIEW COMPARISON:  04/15/2006 FINDINGS: The heart size and mediastinal contours are within normal limits. Both lungs are clear. The visualized skeletal structures are unremarkable. IMPRESSION: No active cardiopulmonary disease. Electronically Signed   By: Duanne Guess D.O.   On: 11/17/2022 18:59   CT Head Wo Contrast  Result Date: 11/17/2022 CLINICAL DATA:  Altered mental status EXAM: CT HEAD WITHOUT CONTRAST TECHNIQUE: Contiguous axial images were obtained from the base of the skull through the vertex without intravenous contrast. RADIATION DOSE REDUCTION: This exam was performed according to the departmental dose-optimization program which includes automated exposure control, adjustment of the mA and/or kV according to patient size and/or use of iterative reconstruction technique. COMPARISON:  None Available. FINDINGS: Brain: No evidence of acute infarction, hemorrhage, hydrocephalus, extra-axial collection or mass lesion/mass effect. Vascular: No hyperdense  vessel or unexpected calcification. Skull: Normal. Negative for fracture or focal lesion. Sinuses/Orbits: No acute finding. Other: None. IMPRESSION: No acute intracranial abnormality. Electronically Signed   By: Duanne Guess D.O.   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 - Associated with altered mental status with confusion. - He is admitted to a progressive unit bed. - Will continue empiric encephalitis and meningitis management with IV acyclovir, vancomycin and ceftriaxone. - We will follow CSF studies. - Neurology consult was obtained by Dr. Derry Lory and is appreciated. - Routine EEG and lab studies were ordered including CSF cell count, differential, protein, glucose, CSF gram stain and cultures, meningitis/encephalitis panel, CMV, EBV PCR, HSV 1/2 PCR, Autoimmune encephalopathy panel on both CSF and serum, oligoclonal bands and IgG index.  - ID consult can be called in AM.  Contact was made with Dr. Manson Passey by the EDP.  Hypertensive urgency - He will be placed on as needed IV labetalol.  Asthma, chronic - He has no current exacerbation. - He will be placed on as needed albuterol MDI.       DVT prophylaxis: Lovenox.  Advanced Care Planning:  Code Status: full code.  Family Communication:  The plan of care was discussed in details with the patient (and family). I answered all questions. The patient agreed to proceed with the above mentioned plan. Further management will depend upon hospital course. Disposition Plan: Back to previous home environment Consults called: He was seen by neurology.  ID was contacted and can be called in a.m. for follow-up. All the records are reviewed and case discussed with ED provider.  Status is: Inpatient   At the time of the admission, it appears that the appropriate admission status for this patient is inpatient.  This is judged to be reasonable and necessary in order to provide the required intensity of service to ensure the patient's safety given the presenting symptoms, physical exam findings and initial radiographic and laboratory data in the context of comorbid conditions.  The patient requires inpatient status due to high intensity of service, high risk of  further deterioration and high frequency of surveillance required.  I certify that at the time of admission, it is my clinical judgment that the patient will require inpatient hospital care extending more than 2 midnights.                            Dispo: The patient is from: Home              Anticipated d/c is to: Home              Patient currently is not medically stable to d/c.              Difficult to place patient: No  Hannah Beat M.D on 11/18/2022 at 5:16 AM  [facility]   From 7 PM-7 AM, contact night-coverage [email or web address]  CC: Primary care physician; Pcp, No

## 2022-11-18 NOTE — Progress Notes (Signed)
Patient seen and examined personally, I reviewed the chart, history and physical and admission note, done by admitting physician this morning and agree with the same with following addendum.  Please refer to the morning admission note for more detailed plan of care.  Briefly,  22 y.o.m w/ asthma who presented to the ED with acute onset of altered mental status with confusion. Patient was complaining of blurred vision, confusion and headache x 1 week, was seen in the ED 6/21 for headaches, has lab work done. Presents again in the ED 6/23.symptom started w/ vomiting, has been feeling weak all over. In ED he was noted to repeat nonsensical babbling,did not know where he was. In the ED: initially hypertensive BP 117/1 1 7, temperature 99.9, not hypoxic. Labs showing stable CMP CBC unremarkable Tylenol level salicylate level normal TSH 1.5, ammonia normal, EKG sinus rhythm UA unremarkable Imaging with CT head, chest x-ray-no acute finding subsequently MRI brain with and without contrast "Multifocal cortical edema within both hemispheres, but mostly on the right. While nonspecific, this is most concerning for viral encephalitis. CSF sampling should be considered". 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  Meningitis/encephalitis CSF panel was negative> Will consult ID, follow-up EEG shows focal seizure right temporal region> LTM EEG started- cont per neurology. Continue current empiric acyclovir along with empiric antibiotics hopefully can de-escalate antibiotics soon.  Focal seizures noted on EEG: LTM EEG started, also placed on Keppra per neurology continue seizure precaution fall precaution  Hypertensive urgency likely in the setting of 1 continue PRN medication if persistent start oral agent  Chronic asthma stable  Hyponatremia,mild: monitor

## 2022-11-18 NOTE — ED Notes (Signed)
Pt gown changed as he urinated on himself while trying to use urinal

## 2022-11-18 NOTE — ED Notes (Signed)
ED TO INPATIENT HANDOFF REPORT  ED Nurse Name and Phone #: Dot Lanes, paramedic [phone]  S Name/Age/Gender Colton Zhang 22 y.o. male Room/Bed: 002C/002C  Code Status   Code Status: Full Code  Home/SNF/Other Home Patient oriented to: self, place, time, and situation Is this baseline? Yes   Triage Complete: Triage complete  Chief Complaint Viral encephalitis [A86]  Triage Note The pt rep[orts that he thinks he's having a stroke  his bp is high  [facility] and was told to come here for tyreatmnet   he does not take med for hypertension  The pt thinks he's hving a stroke because his bp is high and he reports that he has been stuttering  a few minutes later the pt began to look staright at me and starts mumbling  after approx 15 seconds he clearly states I'm ok now with normal speech  no headache   Allergies Allergies  Allergen Reactions   Other     Seasonal Allergies    Level of Care/Admitting Diagnosis ED Disposition     ED Disposition  Admit   Condition  --   Comment  Hospital Area: [facility] [[id number]]  Level of Care: Progressive [102]  Admit to Progressive based on following criteria: NEUROLOGICAL AND NEUROSURGICAL complex patients with significant risk of instability, who do not meet ICU criteria, yet require close observation or frequent assessment (< / = every 2 - 4 hours) with medical / nursing intervention.  May admit patient to [facility] or [facility] if equivalent level of care is available:: No  Covid Evaluation: Asymptomatic - no recent exposure (last 10 days) testing not required  Diagnosis: Viral encephalitis [638509]  Admitting Physician: Hannah Beat [[id number]]  Attending Physician: Hannah Beat [[id number]]  Certification:: I certify this patient will need inpatient services for at least 2 midnights  Estimated Length of Stay: 3          B Medical/Surgery History Past Medical History:  Diagnosis Date   Asthma    Past  Surgical History:  Procedure Laterality Date   CIRCUMCISION       A IV Location/Drains/Wounds Patient Lines/Drains/Airways Status     Active Line/Drains/Airways     Name Placement date Placement time Site Days   Peripheral IV 11/17/22 20 G Left Antecubital 11/17/22  1817  Antecubital  1   Peripheral IV 11/18/22 20 G 1" Right Antecubital 11/18/22  --  Antecubital  less than 1            Intake/Output Last 24 hours  Intake/Output Summary (Last 24 hours) at 11/18/2022 1702 Last data filed at 11/18/2022 1652 Gross per 24 hour  Intake 2341.4 ml  Output 1575 ml  Net 766.4 ml    Labs/Imaging Results for orders placed or performed during the hospital encounter of 11/17/22 (from the past 48 hour(s))  Urinalysis, Routine w reflex microscopic -Urine, Clean Catch     Status: Abnormal   Collection Time: 11/17/22  4:20 PM  Result Value Ref Range   Color, Urine YELLOW YELLOW   APPearance CLEAR CLEAR   Specific Gravity, Urine 1.012 1.005 - 1.030   pH 6.0 5.0 - 8.0   Glucose, UA NEGATIVE NEGATIVE mg/dL   Hgb urine dipstick NEGATIVE NEGATIVE   Bilirubin Urine NEGATIVE NEGATIVE   Ketones, ur NEGATIVE NEGATIVE mg/dL   Protein, ur 956 (A) NEGATIVE mg/dL   Nitrite NEGATIVE NEGATIVE   Leukocytes,Ua NEGATIVE NEGATIVE   RBC / HPF 0-5  0 - 5 RBC/hpf   WBC, UA 0-5 0 - 5 WBC/hpf   Bacteria, UA NONE SEEN NONE SEEN   Squamous Epithelial / HPF 0-5 0 - 5 /HPF   Mucus PRESENT     Comment: Performed at [facility], [address], [place], [place] [address]  Lipase, blood     Status: None   Collection Time: 11/17/22  4:38 PM  Result Value Ref Range   Lipase 29 11 - 51 U/L    Comment: Performed at [facility], [address] [place], [place] [address]  Comprehensive metabolic panel     Status: Abnormal   Collection Time: 11/17/22  4:38 PM  Result Value Ref Range   Sodium 135 135 - 145 mmol/L   Potassium 3.7 3.5 - 5.1 mmol/L   Chloride 94 (L) 98 - 111 mmol/L   CO2 27 22 - 32  mmol/L   Glucose, Bld 132 (H) 70 - 99 mg/dL    Comment: Glucose reference range applies only to samples taken after fasting for at least 8 hours.   BUN 11 6 - 20 mg/dL   Creatinine, Ser 4.78 0.61 - 1.24 mg/dL   Calcium 9.7 8.9 - 29.5 mg/dL   Total Protein 8.4 (H) 6.5 - 8.1 g/dL   Albumin 4.6 3.5 - 5.0 g/dL   AST 31 15 - 41 U/L   ALT 38 0 - 44 U/L   Alkaline Phosphatase 79 38 - 126 U/L   Total Bilirubin 0.8 0.3 - 1.2 mg/dL   GFR, Estimated >62 >13 mL/min    Comment: (NOTE) Calculated using the CKD-EPI Creatinine Equation (2021)    Anion gap 14 5 - 15    Comment: Performed at [facility], [address] [place], [place] [address]  CBC     Status: Abnormal   Collection Time: 11/17/22  4:38 PM  Result Value Ref Range   WBC 8.1 4.0 - 10.5 K/uL   RBC 6.52 (H) 4.22 - 5.81 MIL/uL   Hemoglobin 17.0 13.0 - 17.0 g/dL   HCT 84.6 96.2 - 95.2 %   MCV 78.2 (L) 80.0 - 100.0 fL   MCH 26.1 26.0 - 34.0 pg   MCHC 33.3 30.0 - 36.0 g/dL   RDW 84.1 32.4 - 40.1 %   Platelets 380 150 - 400 K/uL   nRBC 0.0 0.0 - 0.2 %    Comment: Performed at [facility], [address], [place], [place] [address]  TSH     Status: None   Collection Time: 11/17/22  4:38 PM  Result Value Ref Range   TSH 1.504 0.350 - 4.500 uIU/mL    Comment: Performed by a 3rd Generation assay with a functional sensitivity of <=0.01 uIU/mL. Performed at [facility], [address], [place], [place] [address]   CBG monitoring, ED     Status: Abnormal   Collection Time: 11/17/22  6:12 PM  Result Value Ref Range   Glucose-Capillary 122 (H) 70 - 99 mg/dL    Comment: Glucose reference range applies only to samples taken after fasting for at least 8 hours.  Ammonia     Status: None   Collection Time: 11/17/22  6:13 PM  Result Value Ref Range   Ammonia 15 9 - 35 umol/L    Comment: Performed at [facility], [address], [place], [place] [address]  Acetaminophen level     Status: Abnormal   Collection  Time:  11/17/22  6:13 PM  Result Value Ref Range   Acetaminophen (Tylenol), Serum <10 (L) 10 - 30 ug/mL    Comment: (NOTE) Therapeutic concentrations vary significantly. A range of 10-30 ug/mL  may be an effective concentration for many patients. However, some  are best treated at concentrations outside of this range. Acetaminophen concentrations >150 ug/mL at 4 hours after ingestion  and >50 ug/mL at 12 hours after ingestion are often associated with  toxic reactions.  Performed at [facility], [address], [place], [place] [address]   Salicylate level     Status: Abnormal   Collection Time: 11/17/22  6:13 PM  Result Value Ref Range   Salicylate Lvl <7.0 (L) 7.0 - 30.0 mg/dL    Comment: Performed at [facility], [address], [place], [place] [address]  Rapid urine drug screen (hospital performed)     Status: None   Collection Time: 11/17/22  8:35 PM  Result Value Ref Range   Opiates NONE DETECTED NONE DETECTED   Cocaine NONE DETECTED NONE DETECTED   Benzodiazepines NONE DETECTED NONE DETECTED   Amphetamines NONE DETECTED NONE DETECTED   Tetrahydrocannabinol NONE DETECTED NONE DETECTED   Barbiturates NONE DETECTED NONE DETECTED    Comment: (NOTE) DRUG SCREEN FOR MEDICAL PURPOSES ONLY.  IF CONFIRMATION IS NEEDED FOR ANY PURPOSE, NOTIFY LAB WITHIN 5 DAYS.  LOWEST DETECTABLE LIMITS FOR URINE DRUG SCREEN Drug Class                     Cutoff (ng/mL) Amphetamine and metabolites    1000 Barbiturate and metabolites    200 Benzodiazepine                 200 Opiates and metabolites        300 Cocaine and metabolites        300 THC                            50 Performed at [facility], [address] [place], [place] [address]   CSF culture w Gram Stain     Status: None (Preliminary result)   Collection Time: 11/17/22 10:56 PM   Specimen: CSF; Cerebrospinal Fluid  Result Value Ref Range   Specimen Description CSF    Special Requests LP    Gram  Stain      NO WBC SEEN NO ORGANISMS SEEN Performed at [facility], [address], [place], [place] [address]    Culture PENDING    Report Status PENDING   CSF cell count with differential     Status: Abnormal   Collection Time: 11/18/22 12:23 AM  Result Value Ref Range   Tube # 3    Color, CSF COLORLESS COLORLESS   Appearance, CSF HAZY (A) CLEAR   Supernatant NOT INDICATED    RBC Count, CSF 643 (H) 0 /cu mm   WBC, CSF 7 (H) 0 - 5 /cu mm   Other Cells, CSF TOO FEW TO COUNT, SMEAR AVAILABLE FOR REVIEW     Comment:  FEW LYMPHOCYTES, RARE NEUTROPHILS, AND RARE MONOCYTES Performed at [facility], [address], [place], [place] [address]   Protein and glucose, CSF     Status: Abnormal   Collection Time: 11/18/22 12:23 AM  Result Value Ref Range   Glucose, CSF 80 (H) 40 - 70 mg/dL   Total  Protein, CSF 18  15 - 45 mg/dL    Comment: Performed at [facility], [address] [place], [place] [address]  Meningitis/Encephalitis Panel (CSF)     Status: None   Collection Time: 11/18/22  1:00 AM  Result Value Ref Range   Cryptococcus neoformans/gattii (CSF) NOT DETECTED NOT DETECTED    Comment: (NOTE) Patients with a suspicion of cryptococcal meningitis should be tested  for cryptococcal antigen (CrAg).      Cytomegalovirus (CSF) NOT DETECTED NOT DETECTED   Enterovirus (CSF) NOT DETECTED NOT DETECTED   Escherichia coli K1 (CSF) NOT DETECTED NOT DETECTED    Comment: (NOTE) Only E. coli strains possessing the K1 capsular antigen will be detected.      Haemophilus influenzae (CSF) NOT DETECTED NOT DETECTED   Herpes simplex virus 1 (CSF) NOT DETECTED NOT DETECTED   Herpes simplex virus 2 (CSF) NOT DETECTED NOT DETECTED   Human herpesvirus 6 (CSF) NOT DETECTED NOT DETECTED   Human parechovirus (CSF) NOT DETECTED NOT DETECTED   Listeria monocytogenes (CSF) NOT DETECTED NOT DETECTED   Neisseria meningitis (CSF) NOT DETECTED NOT DETECTED    Comment: (NOTE) Only  encapsulated strains of N. meningitidis will be detected.     Streptococcus agalactiae (CSF) NOT DETECTED NOT DETECTED   Streptococcus pneumoniae (CSF) NOT DETECTED NOT DETECTED   Varicella zoster virus (CSF) NOT DETECTED NOT DETECTED    Comment: Performed at [facility], [address], [place], [place] [address]  Sedimentation rate     Status: None   Collection Time: 11/18/22  1:16 AM  Result Value Ref Range   Sed Rate 2 0 - 16 mm/hr    Comment: Performed at [facility], [address], [place], [place] [address]  C-reactive protein     Status: None   Collection Time: 11/18/22  1:16 AM  Result Value Ref Range   CRP <0.5 <1.0 mg/dL    Comment: Performed at [facility], [address], [place], [place] [address]  HIV Antibody (routine testing w rflx)     Status: None   Collection Time: 11/18/22  1:41 AM  Result Value Ref Range   HIV Screen 4th Generation wRfx Non Reactive Non Reactive    Comment: Performed at [facility], [address], [place], [place] [address]  Basic metabolic panel     Status: Abnormal   Collection Time: 11/18/22  4:46 AM  Result Value Ref Range   Sodium 134 (L) 135 - 145 mmol/L   Potassium 3.8 3.5 - 5.1 mmol/L   Chloride 98 98 - 111 mmol/L   CO2 25 22 - 32 mmol/L   Glucose, Bld 106 (H) 70 - 99 mg/dL    Comment: Glucose reference range applies only to samples taken after fasting for at least 8 hours.   BUN 12 6 - 20 mg/dL   Creatinine, Ser 2.53 0.61 - 1.24 mg/dL   Calcium 9.3 8.9 - 66.4 mg/dL   GFR, Estimated >40 >34 mL/min    Comment: (NOTE) Calculated using the CKD-EPI Creatinine Equation (2021)    Anion gap 11 5 - 15    Comment: Performed at [facility], [address], [place], [place] [address]  CBC     Status: Abnormal   Collection Time: 11/18/22  4:46 AM  Result Value Ref Range   WBC 8.6 4.0 - 10.5 K/uL   RBC 6.00 (H) 4.22 - 5.81 MIL/uL   Hemoglobin 16.3 13.0 - 17.0 g/dL  HCT 48.0 39.0 - 52.0 %   MCV 80.0  80.0 - 100.0 fL   MCH 27.2 26.0 - 34.0 pg   MCHC 34.0 30.0 - 36.0 g/dL   RDW 16.1 09.6 - 04.5 %   Platelets 327 150 - 400 K/uL   nRBC 0.0 0.0 - 0.2 %    Comment: Performed at [facility] [facility], [address], [place], [place] [address]   CT [facility] HEAD NECK W WO CM  Result Date: 11/18/2022 CLINICAL DATA:  Slurred speech. EXAM: CT ANGIOGRAPHY HEAD AND NECK WITH AND WITHOUT CONTRAST TECHNIQUE: Multidetector CT imaging of the head and neck was performed using the standard protocol during bolus administration of intravenous contrast. Multiplanar CT image reconstructions and MIPs were obtained to evaluate the vascular anatomy. Carotid stenosis measurements (when applicable) are obtained utilizing NASCET criteria, using the distal internal carotid diameter as the denominator. RADIATION DOSE REDUCTION: This exam was performed according to the departmental dose-optimization program which includes automated exposure control, adjustment of the mA and/or kV according to patient size and/or use of iterative reconstruction technique. CONTRAST:  75mL OMNIPAQUE IOHEXOL 350 MG/ML SOLN COMPARISON:  CT head and brain MRI 1 day prior FINDINGS: CT HEAD FINDINGS Brain: The cortical edema seen on the brain MRI from 1 day prior is not well appreciated on the current study. There is no evidence of acute intracranial hemorrhage, extra-axial fluid collection, or acute territorial infarct Background parenchymal volume is normal. The ventricles are normal in size. Gray-white differentiation is preserved The pituitary and suprasellar region are normal. There is no mass lesion. There is no mass effect or midline shift. Vascular: See below. Skull: Normal. Negative for fracture or focal lesion. Sinuses/Orbits: The paranasal sinuses are clear. The globes and orbits are unremarkable. Other: The mastoid air cells and middle ear cavities are clear. Review of the MIP images confirms the above findings CTA NECK FINDINGS Aortic arch: Imaged  aortic arch is normal. The origins of the major branch vessels are patent. The subclavian arteries are patent to the level imaged. Right carotid system: The right common, internal, and external carotid arteries are patent, without hemodynamically significant stenosis or occlusion there is no evidence of dissection or aneurysm. Left carotid system: The left common, internal, and external carotid arteries are patent, without hemodynamically significant stenosis or occlusion. There is no evidence of dissection or aneurysm. Vertebral arteries: The vertebral arteries are patent, without hemodynamically significant stenosis or occlusion. There is no evidence of dissection or aneurysm. Skeleton: There is no acute osseous abnormality or suspicious osseous lesion. There is no visible canal hematoma. Other neck: The soft tissues of the neck are unremarkable. Upper chest: The imaged lung apices are clear. Review of the MIP images confirms the above findings CTA HEAD FINDINGS Anterior circulation: The intracranial ICAs are normal. The bilateral MCAs and ACAS are normal. The anterior communicating arteries are normal. There is no aneurysm or AVM. Posterior circulation: The bilateral V4 segments are normal. The basilar artery is normal. The major cerebellar arteries appear normal The bilateral PCAs are normal. Small posterior communicating arteries are identified. There is no aneurysm or AVM. Venous sinuses: Patent. Anatomic variants: None. Review of the MIP images confirms the above findings IMPRESSION: 1. The areas of cortical edema seen on the recent brain MRI are not well seen on the current study. No evidence of new acute intracranial pathology. 2.  Normal CTA of the head and neck. Electronically Signed   By: Lesia Hausen M.D.   On:  11/18/2022 15:24   EEG adult  Result Date: 11/18/2022 Charlsie Quest, MD     11/18/2022 10:28 AM Patient Name: Chaun Uemura MRN: [id number] Epilepsy Attending: Charlsie Quest Referring  Physician/Provider: Erick Blinks, MD Date: 11/18/2022 Duration: 22.39 mins Patient history: 22 y.o. male with no significant PMH who presents with progressive confusion, lethargy, headache over the last week. MRI Brain notable for cortical edema within both hemispheres, mostly in right though. EEG to evaluate for seizure Level of alertness: Awake AEDs during EEG study: None Technical aspects: This EEG study was done with scalp electrodes positioned according to the 10-20 International system of electrode placement. Electrical activity was reviewed with band pass filter of 1-70Hz , sensitivity of 7 uV/mm, display speed of 74mm/sec with a 60Hz  notched filter applied as appropriate. EEG data were recorded continuously and digitally stored.  Video monitoring was not available. Description: EEG showed continuous generalized and lateralized right hemisphere 3 to 5 Hz theta-delta slowing. Sharply contoured rhythmic delta slowing as well as sharp waves were noted in right temporal region.  One seizure was noted on 11/18/2022 at 0910.  Unfortunately nobody is available for review.  EEG showed sharply contoured 5 to 6 Hz theta slowing in right posterior temporal region which then evolved into 7 to 8 Hz sharply contoured alpha activity and involved all of right hemisphere as well as left posterior quadrant. Duration of seizure was about 2 minute 10 seconds. Hyperventilation and photic stimulation were not performed.   ABNORMALITY - Focal seizure, right temporal region - Sharp wave, right temporal region - Continuous slow, generalized and lateralized right hemisphere IMPRESSION: This study showed focal seizure arising from right temporal region at 0910, lasting about 2 minutes 10 seconds.  Additionally there is cortical dysfunction in right hemisphere most likely secondary to underlying structural abnormality.  Lastly there is moderate diffuse encephalopathy. Dr. [name] was notified. Charlsie Quest   MR BRAIN W WO  CONTRAST  Result Date: 11/17/2022 CLINICAL DATA:  Slurred speech EXAM: MRI HEAD WITHOUT AND WITH CONTRAST TECHNIQUE: Multiplanar, multiecho pulse sequences of the brain and surrounding structures were obtained without and with intravenous contrast. CONTRAST:  7.71mL GADAVIST GADOBUTROL 1 MMOL/ML IV SOLN COMPARISON:  None Available. FINDINGS: Brain: There is multifocal cortical edema within both hemispheres, but mostly on the right. Locations are annotated on the FLAIR sequence. Locations include the right insula left, right cingulate gyrus and posterior right temporal cortex, others. There is no associated contrast enhancement. No acute hemorrhage or acute infarct. No hydrocephalus. Vascular: Normal flow voids. Skull and upper cervical spine: Normal marrow signal. Sinuses/Orbits: Negative. Other: None. IMPRESSION: Multifocal cortical edema within both hemispheres, but mostly on the right. While nonspecific, this is most concerning for viral encephalitis. CSF sampling should be considered. Electronically Signed   By: Deatra Robinson M.D.   On: 11/17/2022 22:24   DG Chest 2 View  Result Date: 11/17/2022 CLINICAL DATA:  Altered mental status EXAM: CHEST - 2 VIEW COMPARISON:  04/15/2006 FINDINGS: The heart size and mediastinal contours are within normal limits. Both lungs are clear. The visualized skeletal structures are unremarkable. IMPRESSION: No active cardiopulmonary disease. Electronically Signed   By: Duanne Guess D.O.   On: 11/17/2022 18:59   CT Head Wo Contrast  Result Date: 11/17/2022 CLINICAL DATA:  Altered mental status EXAM: CT HEAD WITHOUT CONTRAST TECHNIQUE: Contiguous axial images were obtained from the base of the skull through the vertex without intravenous contrast. RADIATION DOSE REDUCTION: This exam was performed according to  the departmental dose-optimization program which includes automated exposure control, adjustment of the mA and/or kV according to patient size and/or use of  iterative reconstruction technique. COMPARISON:  None Available. FINDINGS: Brain: No evidence of acute infarction, hemorrhage, hydrocephalus, extra-axial collection or mass lesion/mass effect. Vascular: No hyperdense vessel or unexpected calcification. Skull: Normal. Negative for fracture or focal lesion. Sinuses/Orbits: No acute finding. Other: None. IMPRESSION: No acute intracranial abnormality. Electronically Signed   By: Duanne Guess D.O.   On: 11/17/2022 18:49    Pending Labs Unresulted Labs (From admission, onward)     Start     Ordered   11/19/22 0500  Basic metabolic panel  Daily,   R      11/18/22 0831   11/18/22 0945  Thyroid peroxidase antibody  Once,   AD        11/18/22 0944   11/18/22 0945  Thyroglobulin antibody  Once,   AD        11/18/22 0944   11/18/22 0945  ANA w/Reflex if Positive  Once,   AD        11/18/22 0944   11/18/22 0945  ANCA Titers  (Anti-Neutrophilic Cystoplasmic Antibody Panel (PNL))  Once,   AD        11/18/22 0944   11/18/22 0945  Sjogrens syndrome-A extractable nuclear antibody  (Sjogren's Syndrome Antibods (SSA + SSB) (PNL))  Once,   AD        11/18/22 0944   11/18/22 0945  Sjogrens syndrome-B extractable nuclear antibody  (Sjogren's Syndrome Antibods (SSA + SSB) (PNL))  Once,   AD        11/18/22 0944   11/18/22 0322  Oligoclonal bands, CSF + serum  (Oligoclonal Bands, CSF + Serum panel)  Add-on,   AD        11/18/22 0321   11/18/22 0322  IgG CSF index  (IgG CSF Index, CSF + Serum panel)  Add-on,   AD        11/18/22 0321   11/18/22 0321  Draw extra clot tube  (Oligoclonal Bands, CSF + Serum panel)  Once,   R        11/18/22 0321   11/18/22 0321  Draw extra clot tube  (IgG CSF Index, CSF + Serum panel)  Once,   R        11/18/22 0321   11/18/22 0130  HSV 1/2 PCR, CSF (reference lab)  Once,   R        11/18/22 0130   11/17/22 2319  Miscellaneous [facility] test (send-out)  Once,   URGENT       Comments: Autoimmune Encephalitis Ab panel - CSF    Question Answer Comment  Test name / description: Autoimmune Encephalitis Ab panel - CSF   Release to patient Immediate      11/17/22 2319   11/17/22 2319  Miscellaneous [facility] test (send-out)  Once,   URGENT       Comments: Autoimmune Encephalitis Ab panel - serum   Question Answer Comment  Test name / description: Autoimmune Encephalitis Ab panel - serum   Release to patient Immediate      11/17/22 2319   11/17/22 2314  Miscellaneous [facility] test (send-out)  Once,   URGENT       Comments: EBV Rapid PCR on CSF   Question Answer Comment  Test name / description: EBV Rapid PCR on CSF   Release to patient Immediate      11/17/22 2318   11/17/22 2314  CMV dna by pcr, qualitative  Once,   URGENT        11/17/22 2318   11/17/22 2257  Lyme disease dna by pcr(borrelia burg)  ONCE - STAT,   URGENT        11/17/22 2257   11/17/22 2257  VZV PCR, CSF  ONCE - STAT,   URGENT       Question:  Patient immune status  Answer:  Normal   11/17/22 2257   11/17/22 2257  VDRL  ONCE - STAT,   URGENT        11/17/22 2257   11/17/22 2257  JC virus, PRC CSF  ONCE - STAT,   URGENT        11/17/22 2257            Vitals/Pain Today's Vitals   11/18/22 1256 11/18/22 1600 11/18/22 1615 11/18/22 1646  BP:  (!) 182/120 (!) 185/123   Pulse:  (!) 121 (!) 109   Resp:  (!) 0 (!) 0   Temp: 98.7 F (37.1 C)   98.6 F (37 C)  TempSrc: Oral   Oral  SpO2:  97% 98%   Weight:      Height:      PainSc:        Isolation Precautions No active isolations  Medications Medications  labetalol (NORMODYNE) injection 20 mg (20 mg Intravenous Given 11/18/22 0804)  enoxaparin (LOVENOX) injection 40 mg (40 mg Subcutaneous Given 11/18/22 1544)  0.9 %  sodium chloride infusion (0 mLs Intravenous Stopped 11/18/22 1454)  acetaminophen (TYLENOL) tablet 650 mg (650 mg Oral Given 11/18/22 0820)    Or  acetaminophen (TYLENOL) suppository 650 mg ( Rectal See Alternative 11/18/22 0820)  traZODone (DESYREL) tablet 25 mg  (has no administration in time range)  magnesium hydroxide (MILK OF MAGNESIA) suspension 30 mL (has no administration in time range)  ondansetron (ZOFRAN) tablet 4 mg (has no administration in time range)    Or  ondansetron (ZOFRAN) injection 4 mg (has no administration in time range)  hydrALAZINE (APRESOLINE) injection 10 mg (10 mg Intravenous Given 11/18/22 0701)  methylPREDNISolone sodium succinate (SOLU-MEDROL) 1,000 mg in sodium chloride 0.9 % 50 mL IVPB (1,000 mg Intravenous New Bag/Given 11/18/22 1544)  sodium chloride 0.9 % bolus 500 mL (0 mLs Intravenous Stopped 11/17/22 2250)  prochlorperazine (COMPAZINE) injection 5 mg (5 mg Intravenous Given 11/17/22 1835)  LORazepam (ATIVAN) injection 0.5 mg (0.5 mg Intravenous Given 11/17/22 1835)  gadobutrol (GADAVIST) 1 MMOL/ML injection 7.5 mL (7.5 mLs Intravenous Contrast Given 11/17/22 2153)  vancomycin (VANCOREADY) IVPB 1500 mg/300 mL (0 mg Intravenous Stopped 11/18/22 0127)  lidocaine-EPINEPHrine (XYLOCAINE W/EPI) 2 %-1:200000 (PF) injection 20 mL (20 mLs Intradermal Given 11/18/22 0117)  acyclovir (ZOVIRAX) 500 mg in dextrose 5 % 100 mL IVPB (0 mg Intravenous Stopped 11/18/22 0239)  LORazepam (ATIVAN) injection 0.5 mg (0.5 mg Intravenous Given 11/18/22 0001)  LORazepam (ATIVAN) injection 2 mg (2 mg Intravenous Given 11/18/22 1010)  levETIRAcetam (KEPPRA) IVPB 1500 mg/ 100 mL premix (0 mg Intravenous Stopped 11/18/22 1226)    Followed by  levETIRAcetam (KEPPRA) IVPB 1500 mg/ 100 mL premix (0 mg Intravenous Stopped 11/18/22 1110)    Followed by  levETIRAcetam (KEPPRA) IVPB 1000 mg/100 mL premix (0 mg Intravenous Stopped 11/18/22 1251)  iohexol (OMNIPAQUE) 350 MG/ML injection 75 mL (75 mLs Intravenous Contrast Given 11/18/22 1504)    Mobility Walks (baseline)     Focused Assessments     R Recommendations: See Admitting Provider Note  Report  given to:   Additional Notes: Pt is aao x4 one minute and then unresponsive the next.

## 2022-11-18 NOTE — Assessment & Plan Note (Signed)
-   He will be placed on as needed IV labetalol. 

## 2022-11-18 NOTE — Consult Note (Signed)
NEUROLOGY CONSULTATION NOTE   Date of service: November 18, 2022 Patient Name: Colton Zhang MRN:  [id number] DOB:  [birth year] Reason for consult: "confusion" Requesting Provider: Hannah Beat, MD _ _ _   _ __   _ __ _ _  __ __   _ __   __ _  History of Present Illness  Colton Zhang is a 22 y.o. male with no significant PMH who presents with confusion, lethargy, headache. Started with headache, throb in R forehead and R temple. Progressively got worse. On Friday, family picked him up from school and took him to [facility]. Was discharged and confusion worsening. He is sleepy, lethargic and so they brought him to the ED where he had further workup with MRI Brain with and without contrast which was notable for multifocal cortical edema, R worse than left.  Neurology consulted for further evaluation and workup.  Recently was on a cruise to the [place] 2 weeks ago but no URI, UTI, gastroenteritis like symptoms. No prior hx of autoimmune disease.   ROS   Constitutional Denies weight loss, fever and chills.   HEENT Denies changes in vision and hearing.   Respiratory Denies SOB and cough.   CV Denies palpitations and CP   GI Denies abdominal pain, nausea, vomiting and diarrhea.   GU Denies dysuria and urinary frequency.   MSK Denies myalgia and joint pain.   Skin Denies rash and pruritus.   Neurological Denies headache and syncope.   Psychiatric Denies recent changes in mood. Denies anxiety and depression.    Past History   Past Medical History:  Diagnosis Date   Asthma    Past Surgical History:  Procedure Laterality Date   CIRCUMCISION     Family History  Problem Relation Age of Onset   Cirrhosis Father    Migraines Brother    Heart failure Paternal Grandmother    Stroke Paternal Grandfather    Social History   Socioeconomic History   Marital status: Single    Spouse name: Not on file   Number of children: Not on file   Years of education: Not on file   Highest education level:  Not on file  Occupational History   Not on file  Tobacco Use   Smoking status: Never   Smokeless tobacco: Never  Vaping Use   Vaping Use: Never used  Substance and Sexual Activity   Alcohol use: Never   Drug use: Never   Sexual activity: Not Currently    Birth control/protection: Condom  Other Topics Concern   Not on file  Social History Narrative   Not on file   Social Determinants of Health   Financial Resource Strain: Not on file  Food Insecurity: Not on file  Transportation Needs: Not on file  Physical Activity: Not on file  Stress: Not on file  Social Connections: Not on file   Allergies  Allergen Reactions   Other     Seasonal Allergies    Medications  (Not in a hospital admission)    Vitals   Vitals:   11/18/22 0030 11/18/22 0100 11/18/22 0115 11/18/22 0117  BP: (!) 159/98 (!) 163/106 (!) 159/102   Pulse: (!) 101 (!) 144 91   Resp: 19 18 19    Temp:    98.9 F (37.2 C)  TempSrc:    Oral  SpO2: 96% 96% 98%   Weight:      Height:         Body mass index is  27.89 kg/m.  Physical Exam   General: Laying comfortably in bed; in no acute distress.  HENT: Normal oropharynx and mucosa. Normal external appearance of ears and nose.  Neck: Supple, no pain or tenderness  CV: No JVD. No peripheral edema.  Pulmonary: Symmetric Chest rise. Normal respiratory effort.  Abdomen: Soft to touch, non-tender.  Ext: No cyanosis, edema, or deformity  Skin: No rash. Normal palpation of skin.   Musculoskeletal: Normal digits and nails by inspection. No clubbing.   Neurologic Examination  Mental status/Cognition: mildly somnolent, oriented to self, place, month and year, poor attention. Can't do serial 7s, unable to tell me days in a week backwards or month in a year backwards. Speech/language: Fluent, comprehension intact, object naming intact, repetition intact.  Cranial nerves:   CN II Pupils equal and reactive to light, no VF deficits    CN III,IV,VI EOM intact,  no gaze preference or deviation, no nystagmus    CN V normal sensation in V1, V2, and V3 segments bilaterally    CN VII no asymmetry, no nasolabial fold flattening    CN VIII normal hearing to speech    CN IX & X normal palatal elevation, no uvular deviation    CN XI 5/5 head turn and 5/5 shoulder shrug bilaterally    CN XII midline tongue protrusion    Motor:  Muscle bulk: normal, tone normal, pronator drift none tremor none Mvmt Root Nerve  Muscle Right Left Comments  SA C5/6 Ax Deltoid 5 5   EF C5/6 Mc Biceps 5 5   EE C6/7/8 Rad Triceps 5 5   WF C6/7 Med FCR     WE C7/8 PIN ECU     F Ab C8/T1 U ADM/FDI 5 5   HF L1/2/3 Fem Illopsoas 5 5   KE L2/3/4 Fem Quad 5 5   DF L4/5 D Peron Tib Ant 5 5   PF S1/2 Tibial Grc/Sol 5 5    Reflexes:  Right Left Comments  Pectoralis      Biceps (C5/6) 2 2   Brachioradialis (C5/6) 2 2    Triceps (C6/7) 2 2    Patellar (L3/4) 2 2    Achilles (S1)      Hoffman      Plantar     Jaw jerk    Sensation:  Light touch Intact throughout   Pin prick    Temperature    Vibration   Proprioception    Coordination/Complex Motor:  - Finger to Nose intact BL - Heel to shin intact BL - Rapid alternating movement are normal - Gait: deferred given somnolence.  Labs   CBC:  Recent Labs  Lab 11/15/22 1041 11/17/22 1638  WBC 6.0 8.1  HGB 16.6 17.0  HCT 49.0 51.0  MCV 79.8* 78.2*  PLT 364 380    Basic Metabolic Panel:  Lab Results  Component Value Date   NA 135 11/17/2022   K 3.7 11/17/2022   CO2 27 11/17/2022   GLUCOSE 132 (H) 11/17/2022   BUN 11 11/17/2022   CREATININE 1.17 11/17/2022   CALCIUM 9.7 11/17/2022   GFRNONAA >60 11/17/2022   Lipid Panel: No results found for: "LDLCALC" HgbA1c: No results found for: "HGBA1C" Urine Drug Screen:     Component Value Date/Time   LABOPIA NONE DETECTED 11/17/2022 2035   COCAINSCRNUR NONE DETECTED 11/17/2022 2035   LABBENZ NONE DETECTED 11/17/2022 2035   AMPHETMU NONE DETECTED 11/17/2022  2035   THCU NONE DETECTED 11/17/2022 2035   LABBARB  NONE DETECTED 11/17/2022 2035    Alcohol Level No results found for: "ETH"  CT Head without contrast(Personally reviewed): CTH was negative for a large hypodensity concerning for a large territory infarct or hyperdensity concerning for an ICH  MRI Brain with and without contrast(Personally reviewed): Multifocal cortical edema within both hemispheres, but mostly on the right. While nonspecific, this is most concerning for viral encephalitis. CSF sampling should be considered.  rEEG:  pending  Impression   Colton Zhang is a 22 y.o. male with no significant PMH who presents with progressive confusion, lethargy, headache over the last week. MRI Brain notable for cortical edema within both hemispheres, mostly in right thou.  Etiology is pending workup. Highly concerning for infectious/viral vs autoimmune process. No meningismus noted on exam.  Recommendations  - LP with CSF cell count, differential, protein, glucose, CSF gram stain and cultures, meningitis/encephalitis panel, CMV, EBV PCR, HSV 1/2 PCR, Autoimmune encephalopathy panel on both CSF and serum, oligoclonal bands and IgG index. - routine EEG - empiric meningitis coverage with Vanc, Ceftriaxone, Acyclovir. - If preliminary CSF studies are not concerning for infection, would treat with high dose IV Steroids ______________________________________________________________________   Thank you for the opportunity to take part in the care of this patient. If you have any further questions, please contact the neurology consultation attending.  Signed,  Erick Blinks [facility] _ _ _   _ __   _ __ _ _  __ __   _ __   __ _

## 2022-11-18 NOTE — Hospital Course (Addendum)
22 y.o.m w/ asthma who presented to the ED with acute onset of altered mental status with confusion. Patient was complaining of blurred vision, confusion and headache x 1 week, was seen in the ED 6/21 for headaches, has lab work done. Presents again in the ED 6/23.symptom started w/ vomiting, has been feeling weak all over. In ED he was noted to repeat nonsensical babbling,did not know where he was. In the ED: initially hypertensive BP 117/1 1 7, temperature 99.9, not hypoxic. Labs showing stable CMP CBC unremarkable Tylenol level salicylate level normal TSH 1.5, ammonia normal, EKG sinus rhythm UA unremarkable Imaging with CT head, chest x-ray-no acute finding subsequently MRI brain with and without contrast "Multifocal cortical edema within both hemispheres, but mostly on the right. While nonspecific, this is most concerning for viral encephalitis. CSF sampling should be considered". UDS: negative Neurology was consulted-advised LP, EEG LP fluid analysis showed 80 glucose, 643 RBCs, 7 WBCs and 18 protein.LP finding concerning for viral encephalitis Patient was empirically started on IV acyclovir, vancomycin and Rocephin

## 2022-11-18 NOTE — Assessment & Plan Note (Addendum)
-   This is suspicious on brain MRI. - Associated with altered mental status with confusion. - He is admitted to a progressive unit bed. - Will continue empiric encephalitis and meningitis management with IV acyclovir, vancomycin and ceftriaxone. - We will follow CSF studies. - Neurology consult was obtained by Dr. Derry Lory and is appreciated. - Routine EEG and lab studies were ordered including CSF cell count, differential, protein, glucose, CSF gram stain and cultures, meningitis/encephalitis panel, CMV, EBV PCR, HSV 1/2 PCR, Autoimmune encephalopathy panel on both CSF and serum, oligoclonal bands and IgG index.  - ID consult can be called in AM.  Contact was made with Dr. Manson Passey by the EDP.

## 2022-11-18 NOTE — Assessment & Plan Note (Addendum)
-   He has no current exacerbation. - He will be placed on as needed albuterol MDI.

## 2022-11-18 NOTE — Consult Note (Signed)
[facility]    Date of Admission:  11/17/2022   Total days of inpatient antibiotics 2        Reason for Consult: Meningitis     Principal Problem:   Viral encephalitis Active Problems:   Asthma, chronic   Hypertensive urgency   Assessment: 22 year old male with multiple risk factors admitted with: #Altered mental status - Per brother at bedside patient had not been feeling well for about a week.  Then day prior to admission developed vomiting, nonsensical speech.  On arrival to the ED vitals were stable, no leukocytosis. - MRI brain showed multifocal cortical edema in both hemispheres, mostly on the right.  While nonspecific concerning for viral encephalitis.  Patient underwent LP this a.m. noting:643 RBC, 7 WBC protein 18, glucose 80, ME panel negative.  Although 7 WBC disease, given number of RBCs seems consistent with traumatic tap. -ID engaged for input in regards to meningitis - Neurology following noted that given normal CSF, progressive course, MRI appearance, autoimmune/inflammatory process likely, favor empiric steroids.  Seizures noted on EEG. Recommendations:  -Stop antibiotics does not seem infectious in etiology - Follow CSF cultures, pending studies.  Added RPR - ID will sign off Microbiology:   Antibiotics: Acyclovir 6/23 Ceftriaxone 6/20 Vancomycin 10/2018  Cultures:  Other CSF on 6/24: 643 RBC, 7 WBC protein 18, glucose 80, ME panel negative  HPI: Colton Zhang is a 22 y.o. male with past medical history of asthma, ADHD not currently on meds presenting to the ED with acute onset altered mental status and confusion.  Patient started vomiting day prior to admission.  Felt weak.  Noted to be nonsensical babbling per ED provider.  On arrival to the ED no leukocytosis.  Patient afebrile.  CT head no acute abnormality.  Brain MRI revealed multifocal cortical edema within both limits.  Mostly on the right with while nonspecific,  most concerning for viral encephalitis. Started on broad-spectrum CNS coverage.  ID engaged after LP.  Review of Systems: Review of Systems  All other systems reviewed and are negative.   Past Medical History:  Diagnosis Date   Asthma     Social History   Tobacco Use   Smoking status: Never   Smokeless tobacco: Never  Vaping Use   Vaping Use: Never used  Substance Use Topics   Alcohol use: Never   Drug use: Never    Family History  Problem Relation Age of Onset   Cirrhosis Father    Migraines Brother    Heart failure Paternal Grandmother    Stroke Paternal Grandfather    Scheduled Meds:  cloNIDine  0.1 mg Transdermal Weekly   enoxaparin (LOVENOX) injection  40 mg Subcutaneous Daily   Continuous Infusions:  sodium chloride Stopped (11/18/22 1454)   methylPREDNISolone (SOLU-MEDROL) injection Stopped (11/18/22 1711)   valproate sodium 2,500 mg (11/18/22 2217)   [START ON 11/19/2022] valproate sodium     PRN Meds:.acetaminophen **OR** acetaminophen, hydrALAZINE, labetalol, magnesium hydroxide, ondansetron **OR** ondansetron (ZOFRAN) IV, traZODone Allergies  Allergen Reactions   Other     Seasonal Allergies    OBJECTIVE: Blood pressure (!) 171/106, pulse (!) 105, temperature 98.8 F (37.1 C), temperature source Oral, resp. rate 18, height 5\' 11"  (1.803 m), weight 90.7 kg, SpO2 99 %.  Physical Exam Constitutional:      Comments: Eyes closed  HENT:     Head: Normocephalic and atraumatic.     Right Ear: External ear normal.  Left Ear: External ear normal.     Nose: No congestion or rhinorrhea.     Mouth/Throat:     Mouth: Mucous membranes are moist.     Pharynx: Oropharynx is clear.  Eyes:     Extraocular Movements: Extraocular movements intact.     Conjunctiva/sclera: Conjunctivae normal.     Pupils: Pupils are equal, round, and reactive to light.  Cardiovascular:     Rate and Rhythm: Normal rate and regular rhythm.     Heart sounds: No murmur heard.     No friction rub. No gallop.  Pulmonary:     Effort: Pulmonary effort is normal.     Breath sounds: Normal breath sounds.  Abdominal:     General: Abdomen is flat. Bowel sounds are normal.     Palpations: Abdomen is soft.  Musculoskeletal:        General: No swelling.     Cervical back: Normal range of motion and neck supple.  Skin:    General: Skin is dry.     Lab Results Lab Results  Component Value Date   WBC 8.6 11/18/2022   HGB 16.3 11/18/2022   HCT 48.0 11/18/2022   MCV 80.0 11/18/2022   PLT 327 11/18/2022    Lab Results  Component Value Date   CREATININE 1.08 11/18/2022   BUN 12 11/18/2022   NA 134 (L) 11/18/2022   K 3.8 11/18/2022   CL 98 11/18/2022   CO2 25 11/18/2022    Lab Results  Component Value Date   ALT 38 11/17/2022   AST 31 11/17/2022   ALKPHOS 79 11/17/2022   BILITOT 0.8 11/17/2022       Danelle Earthly, MD [facility] 11/18/2022, 10:46 PM  I have personally spent 82 minutes involved in face-to-face and non-face-to-face activities for this patient on the day of the visit. Professional time spent includes the following activities: Preparing to see the patient (review of tests), Obtaining and/or reviewing separately obtained history (admission/discharge record), Performing a medically appropriate examination and/or evaluation , Ordering medications/tests/procedures, referring and communicating with other health care professionals, Documenting clinical information in the EMR, Independently interpreting results (not separately reported), Communicating results to the patient/family/caregiver, Counseling and educating the patient/family/caregiver and Care coordination (not separately reported).

## 2022-11-18 NOTE — Procedures (Signed)
Patient Name: Colton Zhang  MRN: [id number]  Epilepsy Attending: Charlsie Quest  Referring Physician/Provider: Erick Blinks, MD  Date: 11/18/2022 Duration: 22.39 mins  Patient history: 22 y.o. male with no significant PMH who presents with progressive confusion, lethargy, headache over the last week. MRI Brain notable for cortical edema within both hemispheres, mostly in right though. EEG to evaluate for seizure  Level of alertness: Awake  AEDs during EEG study: None  Technical aspects: This EEG study was done with scalp electrodes positioned according to the 10-20 International system of electrode placement. Electrical activity was reviewed with band pass filter of 1-70Hz , sensitivity of 7 uV/mm, display speed of 56mm/sec with a 60Hz  notched filter applied as appropriate. EEG data were recorded continuously and digitally stored.  Video monitoring was not available.  Description: EEG showed continuous generalized and lateralized right hemisphere 3 to 5 Hz theta-delta slowing. Sharply contoured rhythmic delta slowing as well as sharp waves were noted in right temporal region.    One seizure was noted on 11/18/2022 at 0910.  Unfortunately nobody is available for review.  EEG showed sharply contoured 5 to 6 Hz theta slowing in right posterior temporal region which then evolved into 7 to 8 Hz sharply contoured alpha activity and involved all of right hemisphere as well as left posterior quadrant. Duration of seizure was about 2 minute 10 seconds.   Hyperventilation and photic stimulation were not performed.     ABNORMALITY - Focal seizure, right temporal region - Sharp wave, right temporal region - Continuous slow, generalized and lateralized right hemisphere   IMPRESSION: This study showed focal seizure arising from right temporal region at 0910, lasting about 2 minutes 10 seconds.  Additionally there is cortical dysfunction in right hemisphere most likely secondary to underlying  structural abnormality.  Lastly there is moderate diffuse encephalopathy.  Dr. [name] was notified.  Haleemah Buckalew [name]

## 2022-11-18 NOTE — ED Notes (Signed)
Pt alert, NAD, calm, sleepy, follows commands, passively interactive.

## 2022-11-18 NOTE — Progress Notes (Signed)
Patient was seen by Dr.Khaliqdina earlier today.  He is CSF analysis reveals seven WBC, which is borderline and not very concerning for infection, especially with a normal protein.  He does have 600 RBC as well and so my suspicion is that it may be a mild blood contamination contributing to a very mild pleocytosis.   Given this, I think that infection is relatively unlikely and I would favor starting empiric steroids.  During my exam within this morning, he had an increased latency of response, but would answer questions and follow simple commands.  He then did have a behavioral arrest while I was interacting with him and therefore was given 2 mg of Ativan and EEG was reconnected.  Some of his lesions do demonstrate restricted diffusion, but given how isolated to the cortex they are, I think that embolic stroke is very unlikely.  At this point given the progressive course, MRI appearance, and normal CSF I think that an autoimmune/inflammatory process is very likely and I would favor starting empiric steroids at this time.  He has also had at least one seizure on EEG, and I have given him Ativan and will start Keppra after load.  Ritta Slot, MD [facility] [phone]  If 7pm- 7am, please page neurology on call as listed in AMION.

## 2022-11-18 NOTE — ED Notes (Signed)
Patient transported to CT 

## 2022-11-18 NOTE — ED Notes (Signed)
Pt gown changed and pants removed as pt had urinated on himself. Clothes placed in belonging bag

## 2022-11-18 NOTE — ED Notes (Signed)
Sz noted on EEG, neuro at [facility], keppra and ativan ordered

## 2022-11-18 NOTE — ED Notes (Signed)
EEG at [facility], setting up.

## 2022-11-18 NOTE — Progress Notes (Signed)
EEG complete - results pending 

## 2022-11-18 NOTE — ED Notes (Signed)
Pt alert and oriented at this time. When this paramedic first woke him to assess, pt was having trouble finding the correct words. After about a minute, pt became more responsive and followed all commands. He is c/o pain to the left eye with redness.

## 2022-11-19 ENCOUNTER — Inpatient Hospital Stay (HOSPITAL_COMMUNITY): Payer: BC Managed Care – PPO

## 2022-11-19 ENCOUNTER — Encounter (HOSPITAL_COMMUNITY): Payer: Self-pay | Admitting: Family Medicine

## 2022-11-19 DIAGNOSIS — A86 Unspecified viral encephalitis: Secondary | ICD-10-CM | POA: Diagnosis not present

## 2022-11-19 DIAGNOSIS — G40109 Localization-related (focal) (partial) symptomatic epilepsy and epileptic syndromes with simple partial seizures, not intractable, without status epilepticus: Secondary | ICD-10-CM

## 2022-11-19 DIAGNOSIS — G0490 Encephalitis and encephalomyelitis, unspecified: Secondary | ICD-10-CM

## 2022-11-19 LAB — POCT I-STAT 7, (LYTES, BLD GAS, ICA,H+H)
Acid-Base Excess: 1 mmol/L (ref 0.0–2.0)
Bicarbonate: 25.6 mmol/L (ref 20.0–28.0)
Calcium, Ion: 1.15 mmol/L (ref 1.15–1.40)
HCT: 43 % (ref 39.0–52.0)
Hemoglobin: 14.6 g/dL (ref 13.0–17.0)
O2 Saturation: 100 %
Patient temperature: 98.6
Potassium: 4.7 mmol/L (ref 3.5–5.1)
Sodium: 137 mmol/L (ref 135–145)
TCO2: 27 mmol/L (ref 22–32)
pCO2 arterial: 39.1 mmHg (ref 32–48)
pH, Arterial: 7.425 (ref 7.35–7.45)
pO2, Arterial: 328 mmHg — ABNORMAL HIGH (ref 83–108)

## 2022-11-19 LAB — BASIC METABOLIC PANEL
Anion gap: 15 (ref 5–15)
BUN: 14 mg/dL (ref 6–20)
CO2: 21 mmol/L — ABNORMAL LOW (ref 22–32)
Calcium: 9.8 mg/dL (ref 8.9–10.3)
Chloride: 99 mmol/L (ref 98–111)
Creatinine, Ser: 1.32 mg/dL — ABNORMAL HIGH (ref 0.61–1.24)
GFR, Estimated: >60 mL/min (ref >60)
Glucose, Bld: 169 mg/dL — ABNORMAL HIGH (ref 70–99)
Potassium: 4.3 mmol/L (ref 3.5–5.1)
Sodium: 135 mmol/L (ref 135–145)

## 2022-11-19 LAB — ANCA TITERS
Atypical P-ANCA titer: <1:20 {titer}
C-ANCA: <1:20 {titer}
P-ANCA: <1:20 {titer}

## 2022-11-19 LAB — MISC LABCORP TEST (SEND OUT): Labcorp test code: 505535

## 2022-11-19 LAB — GLUCOSE, CAPILLARY
Glucose-Capillary: 150 mg/dL — ABNORMAL HIGH (ref 70–99)
Glucose-Capillary: 162 mg/dL — ABNORMAL HIGH (ref 70–99)

## 2022-11-19 LAB — ANA W/REFLEX IF POSITIVE: Anti Nuclear Antibody (ANA): NEGATIVE

## 2022-11-19 LAB — SJOGRENS SYNDROME-B EXTRACTABLE NUCLEAR ANTIBODY: SSB (La) (ENA) Antibody, IgG: <0.2 AI (ref 0.0–0.9)

## 2022-11-19 LAB — RPR: RPR Ser Ql: NONREACTIVE

## 2022-11-19 LAB — MRSA NEXT GEN BY PCR, NASAL: MRSA by PCR Next Gen: NOT DETECTED

## 2022-11-19 LAB — SJOGRENS SYNDROME-A EXTRACTABLE NUCLEAR ANTIBODY: SSA (Ro) (ENA) Antibody, IgG: <0.2 AI (ref 0.0–0.9)

## 2022-11-19 LAB — THYROID PEROXIDASE ANTIBODY: Thyroperoxidase Ab SerPl-aCnc: <9 IU/mL (ref 0–34)

## 2022-11-19 LAB — VALPROIC ACID LEVEL: Valproic Acid Lvl: 74 ug/mL (ref 50.0–100.0)

## 2022-11-19 LAB — CSF CULTURE W GRAM STAIN

## 2022-11-19 MED ORDER — ROCURONIUM BROMIDE 10 MG/ML (PF) SYRINGE
PREFILLED_SYRINGE | INTRAVENOUS | Status: AC
  Filled 2022-11-19: qty 10

## 2022-11-19 MED ORDER — NOREPINEPHRINE 4 MG/250ML-% IV SOLN: 0.0000 ug/min | INTRAVENOUS | Status: DC

## 2022-11-19 MED ORDER — DOCUSATE SODIUM 50 MG/5ML PO LIQD
100.0000 mg | Freq: Two times a day (BID) | ORAL | Status: DC
  Administered 2022-11-19 – 2022-11-27 (×13): 100 mg
  Filled 2022-11-19 (×13): qty 10

## 2022-11-19 MED ORDER — MIDAZOLAM BOLUS VIA INFUSION
10.0000 mg | Freq: Once | INTRAVENOUS | Status: AC
  Administered 2022-11-19: 10 mg via INTRAVENOUS
  Filled 2022-11-19: qty 10

## 2022-11-19 MED ORDER — MIDAZOLAM HCL 2 MG/2ML IJ SOLN
INTRAMUSCULAR | Status: AC
  Filled 2022-11-19: qty 2

## 2022-11-19 MED ORDER — LACTATED RINGERS IV SOLN: INTRAVENOUS | Status: AC

## 2022-11-19 MED ORDER — NOREPINEPHRINE 4 MG/250ML-% IV SOLN
INTRAVENOUS | Status: AC
  Administered 2022-11-19: 2 mg via INTRAVENOUS
  Filled 2022-11-19: qty 250

## 2022-11-19 MED ORDER — FENTANYL CITRATE PF 50 MCG/ML IJ SOSY
100.0000 ug | PREFILLED_SYRINGE | Freq: Once | INTRAMUSCULAR | Status: AC
  Administered 2022-11-19: 100 ug via INTRAVENOUS

## 2022-11-19 MED ORDER — ORAL CARE MOUTH RINSE: 15.0000 mL | OROMUCOSAL | Status: DC | PRN

## 2022-11-19 MED ORDER — IOHEXOL 350 MG/ML SOLN
75.0000 mL | Freq: Once | INTRAVENOUS | Status: AC | PRN
  Administered 2022-11-19: 75 mL via INTRAVENOUS

## 2022-11-19 MED ORDER — DOCUSATE SODIUM 100 MG PO CAPS: 100.0000 mg | ORAL_CAPSULE | Freq: Two times a day (BID) | ORAL | Status: DC | PRN

## 2022-11-19 MED ORDER — ETOMIDATE 2 MG/ML IV SOLN
20.0000 mg | Freq: Once | INTRAVENOUS | Status: AC
  Administered 2022-11-19: 20 mg via INTRAVENOUS

## 2022-11-19 MED ORDER — ORAL CARE MOUTH RINSE
15.0000 mL | OROMUCOSAL | Status: DC
  Administered 2022-11-19 – 2022-11-27 (×96): 15 mL via OROMUCOSAL

## 2022-11-19 MED ORDER — PROPOFOL 1000 MG/100ML IV EMUL
40.0000 ug/kg/min | INTRAVENOUS | Status: DC
  Administered 2022-11-19: 20 ug/kg/min via INTRAVENOUS
  Administered 2022-11-19 – 2022-11-21 (×17): 80 ug/kg/min via INTRAVENOUS
  Administered 2022-11-21: 60 ug/kg/min via INTRAVENOUS
  Administered 2022-11-22: 30 ug/kg/min via INTRAVENOUS
  Administered 2022-11-22 (×3): 60 ug/kg/min via INTRAVENOUS
  Administered 2022-11-22: 40 ug/kg/min via INTRAVENOUS
  Administered 2022-11-22: 30 ug/kg/min via INTRAVENOUS
  Administered 2022-11-23 (×3): 40 ug/kg/min via INTRAVENOUS
  Filled 2022-11-19 (×21): qty 100
  Filled 2022-11-19: qty 200
  Filled 2022-11-19 (×7): qty 100
  Filled 2022-11-19: qty 200

## 2022-11-19 MED ORDER — MIDAZOLAM-SODIUM CHLORIDE 100-0.9 MG/100ML-% IV SOLN: 10.0000 mg/h | INTRAVENOUS | Status: DC

## 2022-11-19 MED ORDER — MIDAZOLAM BOLUS VIA INFUSION
10.0000 mg | INTRAVENOUS | Status: AC
  Administered 2022-11-19: 10 mg via INTRAVENOUS
  Filled 2022-11-19: qty 10

## 2022-11-19 MED ORDER — INSULIN ASPART 100 UNIT/ML IJ SOLN
0.0000 [IU] | INTRAMUSCULAR | Status: DC
  Administered 2022-11-19: 2 [IU] via SUBCUTANEOUS
  Administered 2022-11-19 – 2022-11-20 (×4): 1 [IU] via SUBCUTANEOUS
  Administered 2022-11-20 – 2022-11-21 (×4): 2 [IU] via SUBCUTANEOUS
  Administered 2022-11-21 (×3): 1 [IU] via SUBCUTANEOUS
  Administered 2022-11-21 – 2022-11-22 (×5): 2 [IU] via SUBCUTANEOUS
  Administered 2022-11-22: 1 [IU] via SUBCUTANEOUS
  Administered 2022-11-22 – 2022-11-23 (×3): 2 [IU] via SUBCUTANEOUS
  Administered 2022-11-23 (×3): 1 [IU] via SUBCUTANEOUS
  Administered 2022-11-23: 2 [IU] via SUBCUTANEOUS
  Administered 2022-11-23 – 2022-11-24 (×2): 1 [IU] via SUBCUTANEOUS
  Administered 2022-11-24 (×2): 2 [IU] via SUBCUTANEOUS
  Administered 2022-11-24: 1 [IU] via SUBCUTANEOUS
  Administered 2022-11-24: 2 [IU] via SUBCUTANEOUS
  Administered 2022-11-25 – 2022-11-26 (×9): 1 [IU] via SUBCUTANEOUS
  Administered 2022-11-26: 2 [IU] via SUBCUTANEOUS
  Administered 2022-11-26 – 2022-11-27 (×5): 1 [IU] via SUBCUTANEOUS

## 2022-11-19 MED ORDER — METOPROLOL TARTRATE 5 MG/5ML IV SOLN
2.5000 mg | Freq: Once | INTRAVENOUS | Status: AC
  Administered 2022-11-19: 2.5 mg via INTRAVENOUS
  Filled 2022-11-19: qty 5

## 2022-11-19 MED ORDER — SODIUM CHLORIDE 0.9 % IV SOLN
200.0000 mg | Freq: Once | INTRAVENOUS | Status: AC
  Administered 2022-11-19: 200 mg via INTRAVENOUS
  Filled 2022-11-19: qty 20

## 2022-11-19 MED ORDER — POLYETHYLENE GLYCOL 3350 17 G PO PACK
17.0000 g | PACK | Freq: Every day | ORAL | Status: DC
  Administered 2022-11-20 – 2022-11-27 (×6): 17 g
  Filled 2022-11-19 (×6): qty 1

## 2022-11-19 MED ORDER — NOREPINEPHRINE 4 MG/250ML-% IV SOLN
0.0000 ug/min | INTRAVENOUS | Status: DC
  Administered 2022-11-19 – 2022-11-20 (×2): 4 ug/min via INTRAVENOUS
  Administered 2022-11-23: 2 ug/min via INTRAVENOUS
  Filled 2022-11-19 (×2): qty 250

## 2022-11-19 MED ORDER — FENTANYL CITRATE PF 50 MCG/ML IJ SOSY: 50.0000 ug | PREFILLED_SYRINGE | Freq: Once | INTRAMUSCULAR | Status: AC

## 2022-11-19 MED ORDER — LORAZEPAM 2 MG/ML IJ SOLN
1.0000 mg | Freq: Once | INTRAMUSCULAR | Status: AC
  Administered 2022-11-19: 1 mg via INTRAVENOUS
  Filled 2022-11-19: qty 1

## 2022-11-19 MED ORDER — ETOMIDATE 2 MG/ML IV SOLN
INTRAVENOUS | Status: AC
  Filled 2022-11-19: qty 20

## 2022-11-19 MED ORDER — IOHEXOL 9 MG/ML PO SOLN: 1000.0000 mL | ORAL | Status: AC

## 2022-11-19 MED ORDER — MIDAZOLAM-SODIUM CHLORIDE 100-0.9 MG/100ML-% IV SOLN
50.0000 mg/h | INTRAVENOUS | Status: DC
  Administered 2022-11-19: 10 mg/h via INTRAVENOUS
  Administered 2022-11-20: 15 mg/h via INTRAVENOUS
  Administered 2022-11-20 (×2): 10 mg/h via INTRAVENOUS
  Administered 2022-11-21: 15 mg/h via INTRAVENOUS
  Administered 2022-11-21: 10 mg/h via INTRAVENOUS
  Administered 2022-11-21: 50 mg/h via INTRAVENOUS
  Administered 2022-11-21: 15 mg/h via INTRAVENOUS
  Administered 2022-11-21 (×2): 50 mg/h via INTRAVENOUS
  Filled 2022-11-19 (×11): qty 100

## 2022-11-19 MED ORDER — PANTOPRAZOLE SODIUM 40 MG IV SOLR
40.0000 mg | Freq: Every day | INTRAVENOUS | Status: DC
  Administered 2022-11-19 – 2022-11-27 (×9): 40 mg via INTRAVENOUS
  Filled 2022-11-19 (×9): qty 10

## 2022-11-19 MED ORDER — MIDAZOLAM HCL 2 MG/2ML IJ SOLN
4.0000 mg | Freq: Once | INTRAMUSCULAR | Status: AC
  Administered 2022-11-19: 2 mg via INTRAVENOUS

## 2022-11-19 MED ORDER — POLYETHYLENE GLYCOL 3350 17 G PO PACK: 17.0000 g | PACK | Freq: Every day | ORAL | Status: DC | PRN

## 2022-11-19 MED ORDER — PROPOFOL BOLUS VIA INFUSION
0.5000 mg/kg | Freq: Once | INTRAVENOUS | Status: AC
  Administered 2022-11-19: 45.35 mg via INTRAVENOUS
  Filled 2022-11-19: qty 46

## 2022-11-19 MED ORDER — SODIUM CHLORIDE 0.9 % IV SOLN
100.0000 mg | Freq: Two times a day (BID) | INTRAVENOUS | Status: DC
  Administered 2022-11-19 – 2022-11-20 (×3): 100 mg via INTRAVENOUS
  Filled 2022-11-19 (×7): qty 10

## 2022-11-19 MED ORDER — PROPOFOL BOLUS VIA INFUSION
0.5000 mg/kg | INTRAVENOUS | Status: AC
  Administered 2022-11-19: 45.35 mg via INTRAVENOUS
  Filled 2022-11-19: qty 46

## 2022-11-19 MED ORDER — FENTANYL CITRATE PF 50 MCG/ML IJ SOSY
PREFILLED_SYRINGE | INTRAMUSCULAR | Status: AC
  Filled 2022-11-19: qty 2

## 2022-11-19 MED ORDER — FENTANYL 2500MCG IN NS 250ML (10MCG/ML) PREMIX INFUSION
50.0000 ug/h | INTRAVENOUS | Status: DC
  Administered 2022-11-19 – 2022-11-23 (×5): 100 ug/h via INTRAVENOUS
  Filled 2022-11-19 (×5): qty 250

## 2022-11-19 MED ORDER — CHLORHEXIDINE GLUCONATE CLOTH 2 % EX PADS
6.0000 | MEDICATED_PAD | Freq: Every day | CUTANEOUS | Status: DC
  Administered 2022-11-19 – 2022-11-22 (×5): 6 via TOPICAL

## 2022-11-19 MED ORDER — ROCURONIUM BROMIDE 10 MG/ML (PF) SYRINGE
100.0000 mg | PREFILLED_SYRINGE | Freq: Once | INTRAVENOUS | Status: AC
  Administered 2022-11-19: 100 mg via INTRAVENOUS

## 2022-11-19 MED ORDER — LEVETIRACETAM IN NACL 1500 MG/100ML IV SOLN
1500.0000 mg | Freq: Two times a day (BID) | INTRAVENOUS | Status: DC
  Administered 2022-11-19 – 2022-11-27 (×18): 1500 mg via INTRAVENOUS
  Filled 2022-11-19 (×18): qty 100

## 2022-11-19 MED ORDER — FENTANYL BOLUS VIA INFUSION: 50.0000 ug | INTRAVENOUS | Status: DC | PRN

## 2022-11-19 MED ORDER — MIDAZOLAM BOLUS VIA INFUSION: 10.0000 mg | Freq: Once | INTRAVENOUS | Status: DC

## 2022-11-19 NOTE — Progress Notes (Signed)
Subjective: Continues to be confused, states that he feels "weird" while I am in the room he has an episode of speech cessation with left facial twitching  Exam: Vitals:   11/19/22 0140 11/19/22 0600  BP: (!) 166/98 (!) 177/110  Pulse: (!) 120 (!) 112  Resp: 20 20  Temp: 99.3 F (37.4 C) 99.9 F (37.7 C)  SpO2: 99% 100%   Gen: In bed, NAD Resp: non-labored breathing, no acute distress Abd: soft, nt  Neuro: MS:.  Awake, answering questions at the beginning of my exam, but not at the end, even during his episode of apparent speech cessation, he continue to follow commands bilaterally. CN: Eyes are midline, he reacts to visual stimuli in both hemifield's, face is symmetric Motor: He moves all extremities to command Sensory: Responds to stimulation bilaterally  Pertinent Labs: CSF RBC 643 CSF WBC seven CSF protein 18 CSF glucose 80  Bio[facility] meningitis/encephalitis panel-negative CSF Lyme by PCR-pending VZV PCR-pending HSV PCR-negative on bio fire panel, but separate test is pending CSF JC virus-pending VDRL-pending CSF CMV by PCR-pending CSF EBV-pending   CSF autoimmune encephalitis panel-pending CSF IgG index-pending   CRP-less than 0.2 ESR-2 ANA-pending Thyroglobulin/thyroid peroxidase antibodies-pending SSA/SSB-pending Serum autoimmune encephalitis panel-pending  RPR-pending HIV-negative Ammonia 15 TSH 1.5 Admission creatinine 1.17 UDS-negative  Impression: 22 year old male with fairly rapidly progressive encephalopathy and seizures with multifocal areas of T2 signal change in a single area of restricted diffusion on MRI.  With relatively bland CSF, no fever or leukocytosis, I think that infectious etiology is very unlikely.  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  I will get CT chest abdomen pelvis, and start another antibiotic.  Recommendations: 1)  continue Keppra at 1500 twice daily 2) continue Depacon 500 3 times daily, check level 3) and lacosamide 200 mg x 1 followed by 100 mg twice daily 4) Ativan 1 mg x 1 5) if he continues having seizures despite this, may need to consider transfer to the ICU for intubation and electric bur suppression 6) continue Solu-Medrol 1 g daily 7) May need to consider adding additional immunomodulation such as plasma exchange or IVIG if he does not respond 8) neurology will continue to follow  Ritta Slot, MD [facility] [phone]  If 7pm- 7am, please page neurology on call as listed in AMION.

## 2022-11-19 NOTE — Procedures (Signed)
Central Venous Catheter Insertion Procedure Note  [facility]  [id number]  06/20/2000  Date:11/19/22  Time:6:26 PM   Provider Performing:Salvatore Poe Salena Saner Katrinka Blazing   Procedure: Insertion of Non-tunneled Central Venous Catheter(36556)with US guidance (40981)    Indication(s) PLEX  Consent Risks of the procedure as well as the alternatives and risks of each were explained to the patient and/or caregiver.  Consent for the procedure was obtained and is signed in the bedside chart  Anesthesia Topical only with 1% lidocaine   Timeout Verified patient identification, verified procedure, site/side was marked, verified correct patient position, special equipment/implants available, medications/allergies/relevant history reviewed, required imaging and test results available.  Sterile Technique Maximal sterile technique including full sterile barrier drape, hand hygiene, sterile gown, sterile gloves, mask, hair covering, sterile ultrasound probe cover (if used).  Procedure Description Area of catheter insertion was cleaned with chlorhexidine and draped in sterile fashion.   With real-time ultrasound guidance a HD catheter was placed into the right internal jugular vein.  Nonpulsatile blood flow and easy flushing noted in all ports.  The catheter was sutured in place and sterile dressing applied.  Complications/Tolerance None; patient tolerated the procedure well. Chest X-ray is ordered to verify placement for internal jugular or subclavian cannulation.  Chest x-ray is not ordered for femoral cannulation.  EBL Minimal  Specimen(s) None

## 2022-11-19 NOTE — Progress Notes (Signed)
Patient continues to have an abnormal waxing and waning pattern on the right side which is sometimes associated with facial twitching.  During most of the time that I examined him, he has been nonverbal, however while I was in the room discussing things with his mother he had a period of relative quiescence on the EEG and during that time he would answer simple questions.  I strongly favor an ictal nature to this pattern at this point.  He is already getting sedated by his current medications, and he has not responded to three antiepileptics plus Ativan, and at this point I would favor controlling the pattern as we treat a likely underlying autoimmune process.  I think that being more aggressive at this point without airway protection would be risky, and I would favor intubation and proceeding with burst suppression as we start plasmapheresis and continue Solu-Medrol.   This patient is critically ill and at significant risk of neurological worsening, death and care requires constant monitoring of vital signs, hemodynamics,respiratory and cardiac monitoring, neurological assessment, discussion with family, other specialists and medical decision making of high complexity. I spent 40 minutes of neurocritical care time  in the care of  this patient. This was time spent independent of any time provided by nurse practitioner or PA.  Ritta Slot, MD [facility] [phone]  If 7pm- 7am, please page neurology on call as listed in [facility]. 11/19/2022  5:48 PM

## 2022-11-19 NOTE — Progress Notes (Signed)
PROGRESS NOTE [facility]  WUJ:[id number] DOB: [birth year] DOA: 11/17/2022 PCP: Pcp, No  Brief Narrative/Hospital Course: 22 y.o.m w/ asthma who presented to the ED with acute onset of altered mental status with confusion. Patient was complaining of blurred vision, confusion and headache x 1 week, was seen in the ED 6/21 for headaches, has lab work done. Presents again in the ED 6/23.symptom started w/ vomiting, has been feeling weak all over. In ED he was noted to repeat nonsensical babbling,did not know where he was. In the ED: initially hypertensive BP 117/1 1 7, temperature 99.9, not hypoxic. Labs showing stable CMP CBC unremarkable Tylenol level salicylate level normal TSH 1.5, ammonia normal, EKG sinus rhythm UA unremarkable Imaging with CT head, chest x-ray-no acute finding subsequently MRI brain with and without contrast "Multifocal cortical edema within both hemispheres, but mostly on the right. While nonspecific, this is most concerning for viral encephalitis. CSF sampling should be considered". UDS: negative Neurology was consulted-advised LP, EEG LP fluid analysis showed 80 glucose, 643 RBCs, 7 WBCs and 18 protein.LP finding concerning for viral encephalitis Patient was empirically started on IV acyclovir, vancomycin and Rocephin and admitted     Subjective: Seen and examined this morning. 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: Patient with headache, and rapidly progressive encephalopathy> MRI with multiple cortical edema and multiple areas of T2 signal changes, S/P LP  relatively bland CSF no fever or leukocytosis, concern about viral encephalitis initially placed on empiric antiviral and antibiotics,Meningitis/encephalitis CSF panel was negative, further workup with autoimmune/viral CSF Lyme serology was ordered by neurology > please see neurology note for details-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 LTM EEG> howing seizures> started on Keppra, monitor.  Encouraging Levothyroid Discussed with Dr. [name] this morning he advised that if continues to have seizure he plans to transfer to ICU for intubation and elective burst suppression and likely IVIG versus plasma exchange. CT chest abdomen pelvis with contrast has been ordered  Hypertensive urgency likely in the setting of 1.  BP stable fairly on clonidine patch. Cont prn meds  Chronic asthma stable  Hyponatremia,mild: monitor  AKI creatinine at 1.3 from 1.0 already on IVF 50 cc/h increased to 150 cc  and monitor.  DVT prophylaxis: enoxaparin (LOVENOX) injection 40 mg Start: 11/18/22 1400 Code Status:   Code Status: Full Code Family Communication: plan of care discussed with patient at bedside.  I had updated patient brother at bedside 6/24 Patient status is: Inpatient because of encephalitis and seizure Level of care: Progressive   Dispo: The patient is from: home            Anticipated disposition: TBD  Objective: Vitals last 24 hrs: Vitals:   11/19/22 0800 11/19/22 0900 11/19/22 1000 11/19/22 1111  BP:    (!) 169/115  Pulse:    99  Resp: 18 (!) 23 (!) 31 14  Temp:    98.9 F (37.2 C)  TempSrc:    Oral  SpO2:  99%  Weight:      Height:       Weight change:   Physical Examination: General exam: alert awake, older than stated age HEENT:Oral mucosa moist, Ear/Nose WNL grossly Respiratory system: bilaterally CLEAR BS, no use of  accessory muscle Cardiovascular system: S1 & S2 +, No JVD. Gastrointestinal system: Abdomen soft,NT,ND, BS+ Nervous System:Alert, awake, slow to respond, moving HIS extremities. Extremities: LE edema NEG,distal peripheral pulses palpable.  Skin: No rashes,no icterus. MSK: Normal muscle bulk,tone, power  Medications reviewed:  Scheduled Meds:  cloNIDine  0.1 mg Transdermal Weekly   enoxaparin (LOVENOX) injection  40 mg Subcutaneous Daily   iohexol  1,000 mL Oral Q1H   Continuous Infusions:  sodium chloride Stopped (11/18/22 1454)   lacosamide (VIMPAT) IV     lacosamide (VIMPAT) IV     levETIRAcetam 1,500 mg (11/19/22 0948)   methylPREDNISolone (SOLU-MEDROL) injection Stopped (11/18/22 1711)   valproate sodium 500 mg (11/19/22 0518)    Diet Order             Diet NPO time specified  Diet effective now                   Intake/Output Summary (Last 24 hours) at 11/19/2022 1119 Last data filed at 11/19/2022 0340 Gross per 24 hour  Intake 1592.16 ml  Output 800 ml  Net 792.16 ml   Net IO Since Admission: 1,358.56 mL [11/19/22 1119]  Wt Readings from Last 3 Encounters:  11/17/22 90.7 kg  01/13/17 75.6 kg (95 %, Z= 1.66)*  10/28/13 44.4 kg (81 %, Z= 0.86)*   * Growth percentiles are based on CDC (Boys, 2-20 Years) data.     Unresulted Labs (From admission, onward)     Start     Ordered   11/19/22 0919  Valproic acid level  Once,   R        11/19/22 0918   11/19/22 0500  Basic metabolic panel  Daily,   R      11/18/22 0831   11/18/22 0945  Thyroglobulin antibody  Once,   AD        11/18/22 0944   11/18/22 0945  ANCA Titers  (Anti-Neutrophilic Cystoplasmic Antibody Panel (PNL))  Once,   AD        11/18/22 0944   11/18/22 0322  Oligoclonal bands, CSF + serum  (Oligoclonal Bands, CSF + Serum panel)  Add-on,   AD        11/18/22 0321   11/18/22 0322  IgG CSF index  (IgG CSF Index, CSF + Serum panel)  Add-on,   AD        11/18/22 0321   11/18/22 0321  Draw extra clot  tube  (Oligoclonal Bands, CSF + Serum panel)  Once,   R        11/18/22 0321   11/18/22 0321  Draw extra clot tube  (IgG CSF Index, CSF + Serum panel)  Once,   R        11/18/22 0321   11/18/22 0130  HSV 1/2 PCR, CSF (reference lab)  Once,   R        11/18/22 0130   11/17/22 2319  Miscellaneous [facility] test (send-out)  Once,   URGENT       Comments: Autoimmune Encephalitis Ab panel - CSF   Question Answer Comment  Test name / description: Autoimmune Encephalitis Ab panel - CSF   Release to patient Immediate      11/17/22 2319  11/17/22 2319  Miscellaneous [facility] test (send-out)  Once,   URGENT       Comments: Autoimmune Encephalitis Ab panel - serum   Question Answer Comment  Test name / description: Autoimmune Encephalitis Ab panel - serum   Release to patient Immediate      11/17/22 2319   11/17/22 2314  Miscellaneous [facility] test (send-out)  Once,   URGENT       Comments: EBV Rapid PCR on CSF   Question Answer Comment  Test name / description: EBV Rapid PCR on CSF   Release to patient Immediate      11/17/22 2318   11/17/22 2314  CMV dna by pcr, qualitative  Once,   URGENT        11/17/22 2318   11/17/22 2257  Lyme disease dna by pcr(borrelia burg)  ONCE - STAT,   URGENT        11/17/22 2257   11/17/22 2257  VZV PCR, CSF  ONCE - STAT,   URGENT       Question:  Patient immune status  Answer:  Normal   11/17/22 2257   11/17/22 2257  VDRL  ONCE - STAT,   URGENT        11/17/22 2257   11/17/22 2257  JC virus, PRC CSF  ONCE - STAT,   URGENT        11/17/22 2257          Data Reviewed: I have personally reviewed following labs and imaging studies CBC: Recent Labs  Lab 11/15/22 1041 11/17/22 1638 11/18/22 0446  WBC 6.0 8.1 8.6  HGB 16.6 17.0 16.3  HCT 49.0 51.0 48.0  MCV 79.8* 78.2* 80.0  PLT 364 380 327   Basic Metabolic Panel: Recent Labs  Lab 11/15/22 1041 11/17/22 1638 11/18/22 0446 11/19/22 0346  NA 139 135 134* 135  K 3.2* 3.7 3.8 4.3  CL 100  94* 98 99  CO2 27 27 25  21*  GLUCOSE 137* 132* 106* 169*  BUN 6 11 12 14   CREATININE 1.17 1.17 1.08 1.32*  CALCIUM 9.7 9.7 9.3 9.8   GFR: Estimated Creatinine Clearance: 102.9 mL/min (A) (by C-G formula based on SCr of 1.32 mg/dL (H)). Liver Function Tests: Recent Labs  Lab 11/17/22 1638  AST 31  ALT 38  ALKPHOS 79  BILITOT 0.8  PROT 8.4*  ALBUMIN 4.6   Recent Labs  Lab 11/17/22 1638  LIPASE 29   Recent Labs  Lab 11/17/22 1813  AMMONIA 15   Recent Labs  Lab 11/17/22 1812  GLUCAP 122*   Recent Labs    11/17/22 1638  TSH 1.504   Sepsis Labs: No results for input(s): "PROCALCITON", "LATICACIDVEN" in the last 168 hours.  Recent Results (from the past 240 hour(s))  CSF culture w Gram Stain     Status: None (Preliminary result)   Collection Time: 11/17/22 10:56 PM   Specimen: CSF; Cerebrospinal Fluid  Result Value Ref Range Status   Specimen Description CSF  Final   Special Requests LP  Final   Gram Stain NO WBC SEEN NO ORGANISMS SEEN   Final   Culture   Final    NO GROWTH 1 DAY Performed at [facility], [address], [place], [place] [address]    Report Status PENDING  Incomplete    Antimicrobials: Anti-infectives (From admission, onward)    Start     Dose/Rate Route Frequency Ordered Stop   11/18/22 0800  vancomycin (VANCOCIN) IVPB 1000  mg/200 mL premix  Status:  Discontinued        1,000 mg 200 mL/hr over 60 Minutes Intravenous Every 8 hours 11/18/22 0017 11/18/22 1118   11/18/22 0400  acyclovir (ZOVIRAX) 750 mg in dextrose 5 % 250 mL IVPB  Status:  Discontinued        750 mg 265 mL/hr over 60 Minutes Intravenous Every 8 hours 11/18/22 0017 11/18/22 0937   11/18/22 0130  acyclovir (ZOVIRAX) 905 mg in dextrose 5 % 250 mL IVPB  Status:  Discontinued        10 mg/kg  90.7 kg 268.1 mL/hr over 60 Minutes Intravenous Every 8 hours 11/18/22 0119 11/18/22 0124   11/18/22 0130  cefTRIAXone (ROCEPHIN) 2 g in sodium chloride 0.9 % 100 mL IVPB   Status:  Discontinued        2 g 200 mL/hr over 30 Minutes Intravenous Every 12 hours 11/18/22 0119 11/18/22 0126   11/17/22 2315  acyclovir (ZOVIRAX) 500 mg in dextrose 5 % 100 mL IVPB        500 mg 110 mL/hr over 60 Minutes Intravenous Once 11/17/22 2306 11/18/22 0239   11/17/22 2245  cefTRIAXone (ROCEPHIN) 2 g in sodium chloride 0.9 % 100 mL IVPB  Status:  Discontinued        2 g 200 mL/hr over 30 Minutes Intravenous Every 12 hours 11/17/22 2243 11/18/22 1118   11/17/22 2245  vancomycin (VANCOREADY) IVPB 1500 mg/300 mL        1,500 mg 150 mL/hr over 120 Minutes Intravenous  Once 11/17/22 2245 11/18/22 0127   11/17/22 2245  acyclovir (ZOVIRAX) 500 mg in dextrose 5 % 100 mL IVPB  Status:  Discontinued        500 mg 110 mL/hr over 60 Minutes Intravenous Every 8 hours 11/17/22 2245 11/17/22 2305      Culture/Microbiology    Component Value Date/Time   SDES CSF 11/17/2022 2256   SPECREQUEST LP 11/17/2022 2256   CULT  11/17/2022 2256    NO GROWTH 1 DAY Performed at [facility], [address] [place], [place] [address]    REPTSTATUS PENDING 11/17/2022 2256  Other culture-see note  Radiology Studies: Overnight EEG with video  Result Date: 11/19/2022 Charlsie Quest, MD     11/19/2022 10:40 AM Patient Name: Guiseppe Flanagan MRN: [id number] Epilepsy Attending: Charlsie Quest Referring Physician/Provider: Charlsie Quest, MD Duration: 11/18/2022 1016 to 11/19/2022 1016  Patient history: 22 y.o. male with no significant PMH who presents with progressive confusion, lethargy, headache over the last week. MRI Brain notable for cortical edema within both hemispheres, mostly in right though. EEG to evaluate for seizure  Level of alertness: Awake, asleep  AEDs during EEG study: None  Technical aspects: This EEG study was done with scalp electrodes positioned according to the 10-20 International system of electrode placement. Electrical activity was reviewed with band pass filter of 1-70Hz ,  sensitivity of 7 uV/mm, display speed of 37mm/sec with a 60Hz  notched filter applied as appropriate. EEG data were recorded continuously and digitally stored.  Video monitoring was not available.  Description: EEG showed continuous generalized and lateralized right hemisphere 3 to 5 Hz theta-delta slowing.  Sleep was characterized by sleep spindles (12 to 15 Hz), maximal frontocentral region.  Lateralized periodic discharges ( LPD) were noted in right hemisphere, maximal  right centro-temporo-parietal region.  These lateralized periodic discharges had fluctuating frequency of 1 to 2.5 Hz with overriding fast activity and at times overriding  rhythmicity. Seizures without clinical signs were noted on 11/19/2022 at 1505, 1538, 1640,1710, 2252 and on 11/19/2022 0749, 0830. EEG showed sharply contoured 5 to 6 Hz theta slowing in right posterior temporal region which then evolved into 7 to 8 Hz sharply contoured alpha activity and involved all of right hemisphere as well as left posterior quadrant.  Average duration of seizures was about 2-3 minutes. Event button was pressed on 11/19/2022 at 0927.  Per neurologist patient was noted to have left facial twitching and not responding.  Concomitant EEG showed lateralized periodic discharges with overriding fast activity and rhythmicity in right hemisphere, maximal right centro-temporo-parietal region with evolution in morphology and frequency.  This was consistent with focal motor seizure arising from  right centro-temporo-parietal region. Duration of seizure was about 2 minutes  Hyperventilation and photic stimulation were not performed.    ABNORMALITY - Focal seizure, centro-temporo-parietal region. -  Lateralized periodic discharges ( LPD +F+R), right hemisphere, maximal  right centro-temporo-parietal region - Continuous slow, generalized and lateralized right hemisphere  IMPRESSION: This study showed focal motor seizure arising from right centro-temporo-parietal region during  which patient was noted to have left facial twitching and speech arrest on 11/19/2022 at 0927, lasting about 2 minutes. Seven seizures without clinical signs were also noted arising from right centro-temporo-parietal region as described above. Additionally there is evidence of epileptogenicity and cortical dysfunction arising from right hemisphere, maximal  right centro-temporo-parietal region. This EEG pattern is on the ictal-interictal continuum.  Due to the overlying fast activity and rhythmicity, the EEG pattern is more concerning for ictal nature. Lastly there is moderate diffuse encephalopathy.  Dr. [name] was notified.  Charlsie Quest   CT ANGIO HEAD NECK W WO CM  Result Date: 11/18/2022 CLINICAL DATA:  Slurred speech. EXAM: CT ANGIOGRAPHY HEAD AND NECK WITH AND WITHOUT CONTRAST TECHNIQUE: Multidetector CT imaging of the head and neck was performed using the standard protocol during bolus administration of intravenous contrast. Multiplanar CT image reconstructions and MIPs were obtained to evaluate the vascular anatomy. Carotid stenosis measurements (when applicable) are obtained utilizing NASCET criteria, using the distal internal carotid diameter as the denominator. RADIATION DOSE REDUCTION: This exam was performed according to the departmental dose-optimization program which includes automated exposure control, adjustment of the mA and/or kV according to patient size and/or use of iterative reconstruction technique. CONTRAST:  75mL OMNIPAQUE IOHEXOL 350 MG/ML SOLN COMPARISON:  CT head and brain MRI 1 day prior FINDINGS: CT HEAD FINDINGS Brain: The cortical edema seen on the brain MRI from 1 day prior is not well appreciated on the current study. There is no evidence of acute intracranial hemorrhage, extra-axial fluid collection, or acute territorial infarct Background parenchymal volume is normal. The ventricles are normal in size. Gray-white differentiation is preserved The pituitary and  suprasellar region are normal. There is no mass lesion. There is no mass effect or midline shift. Vascular: See below. Skull: Normal. Negative for fracture or focal lesion. Sinuses/Orbits: The paranasal sinuses are clear. The globes and orbits are unremarkable. Other: The mastoid air cells and middle ear cavities are clear. Review of the MIP images confirms the above findings CTA NECK FINDINGS Aortic arch: Imaged aortic arch is normal. The origins of the major branch vessels are patent. The subclavian arteries are patent to the level imaged. Right carotid system: The right common, internal, and external carotid arteries are patent, without hemodynamically significant stenosis or occlusion there is no evidence of dissection or aneurysm. Left carotid system: The left common, internal,  and external carotid arteries are patent, without hemodynamically significant stenosis or occlusion. There is no evidence of dissection or aneurysm. Vertebral arteries: The vertebral arteries are patent, without hemodynamically significant stenosis or occlusion. There is no evidence of dissection or aneurysm. Skeleton: There is no acute osseous abnormality or suspicious osseous lesion. There is no visible canal hematoma. Other neck: The soft tissues of the neck are unremarkable. Upper chest: The imaged lung apices are clear. Review of the MIP images confirms the above findings CTA HEAD FINDINGS Anterior circulation: The intracranial ICAs are normal. The bilateral MCAs and ACAS are normal. The anterior communicating arteries are normal. There is no aneurysm or AVM. Posterior circulation: The bilateral V4 segments are normal. The basilar artery is normal. The major cerebellar arteries appear normal The bilateral PCAs are normal. Small posterior communicating arteries are identified. There is no aneurysm or AVM. Venous sinuses: Patent. Anatomic variants: None. Review of the MIP images confirms the above findings IMPRESSION: 1. The areas of  cortical edema seen on the recent brain MRI are not well seen on the current study. No evidence of new acute intracranial pathology. 2.  Normal CTA of the head and neck. Electronically Signed   By: Lesia Hausen M.D.   On: 11/18/2022 15:24   EEG adult  Result Date: 11/18/2022 Charlsie Quest, MD     11/18/2022 10:28 AM Patient Name: Seydou Hearns MRN: [id number] Epilepsy Attending: Charlsie Quest Referring Physician/Provider: Erick Blinks, MD Date: 11/18/2022 Duration: 22.39 mins Patient history: 22 y.o. male with no significant PMH who presents with progressive confusion, lethargy, headache over the last week. MRI Brain notable for cortical edema within both hemispheres, mostly in right though. EEG to evaluate for seizure Level of alertness: Awake AEDs during EEG study: None Technical aspects: This EEG study was done with scalp electrodes positioned according to the 10-20 International system of electrode placement. Electrical activity was reviewed with band pass filter of 1-70Hz , sensitivity of 7 uV/mm, display speed of 35mm/sec with a 60Hz  notched filter applied as appropriate. EEG data were recorded continuously and digitally stored.  Video monitoring was not available. Description: EEG showed continuous generalized and lateralized right hemisphere 3 to 5 Hz theta-delta slowing. Sharply contoured rhythmic delta slowing as well as sharp waves were noted in right temporal region.  One seizure was noted on 11/18/2022 at 0910.  Unfortunately nobody is available for review.  EEG showed sharply contoured 5 to 6 Hz theta slowing in right posterior temporal region which then evolved into 7 to 8 Hz sharply contoured alpha activity and involved all of right hemisphere as well as left posterior quadrant. Duration of seizure was about 2 minute 10 seconds. Hyperventilation and photic stimulation were not performed.   ABNORMALITY - Focal seizure, right temporal region - Sharp wave, right temporal region - Continuous  slow, generalized and lateralized right hemisphere IMPRESSION: This study showed focal seizure arising from right temporal region at 0910, lasting about 2 minutes 10 seconds.  Additionally there is cortical dysfunction in right hemisphere most likely secondary to underlying structural abnormality.  Lastly there is moderate diffuse encephalopathy. Dr. [name] was notified. Charlsie Quest   MR BRAIN W WO CONTRAST  Result Date: 11/17/2022 CLINICAL DATA:  Slurred speech EXAM: MRI HEAD WITHOUT AND WITH CONTRAST TECHNIQUE: Multiplanar, multiecho pulse sequences of the brain and surrounding structures were obtained without and with intravenous contrast. CONTRAST:  7.37mL GADAVIST GADOBUTROL 1 MMOL/ML IV SOLN COMPARISON:  None Available. FINDINGS: Brain: There is multifocal  cortical edema within both hemispheres, but mostly on the right. Locations are annotated on the FLAIR sequence. Locations include the right insula left, right cingulate gyrus and posterior right temporal cortex, others. There is no associated contrast enhancement. No acute hemorrhage or acute infarct. No hydrocephalus. Vascular: Normal flow voids. Skull and upper cervical spine: Normal marrow signal. Sinuses/Orbits: Negative. Other: None. IMPRESSION: Multifocal cortical edema within both hemispheres, but mostly on the right. While nonspecific, this is most concerning for viral encephalitis. CSF sampling should be considered. Electronically Signed   By: Deatra Robinson M.D.   On: 11/17/2022 22:24   DG Chest 2 View  Result Date: 11/17/2022 CLINICAL DATA:  Altered mental status EXAM: CHEST - 2 VIEW COMPARISON:  04/15/2006 FINDINGS: The heart size and mediastinal contours are within normal limits. Both lungs are clear. The visualized skeletal structures are unremarkable. IMPRESSION: No active cardiopulmonary disease. Electronically Signed   By: Duanne Guess D.O.   On: 11/17/2022 18:59   CT Head Wo Contrast  Result Date: 11/17/2022 CLINICAL  DATA:  Altered mental status EXAM: CT HEAD WITHOUT CONTRAST TECHNIQUE: Contiguous axial images were obtained from the base of the skull through the vertex without intravenous contrast. RADIATION DOSE REDUCTION: This exam was performed according to the departmental dose-optimization program which includes automated exposure control, adjustment of the mA and/or kV according to patient size and/or use of iterative reconstruction technique. COMPARISON:  None Available. FINDINGS: Brain: No evidence of acute infarction, hemorrhage, hydrocephalus, extra-axial collection or mass lesion/mass effect. Vascular: No hyperdense vessel or unexpected calcification. Skull: Normal. Negative for fracture or focal lesion. Sinuses/Orbits: No acute finding. Other: None. IMPRESSION: No acute intracranial abnormality. Electronically Signed   By: Duanne Guess D.O.   On: 11/17/2022 18:49     LOS: 1 day   Lanae Boast, MD [facility]  11/19/2022, 11:19 AM

## 2022-11-19 NOTE — Progress Notes (Signed)
Maint done, no skin breakdowna1, p3, p7

## 2022-11-19 NOTE — Progress Notes (Signed)
NAME:  Colton Zhang, MRN:  [id number], DOB:  04-24-2001, LOS: 2 ADMISSION DATE:  11/17/2022 CONSULTATION DATE:  11/19/2022 REFERRING MD:  [name] - Neuro CHIEF COMPLAINT:  AMS, hypertension  History of Present Illness:  22 year old man who presented to [facility] 6/23 with HA and blurry vision. PMHx significant for asthma, ADD.   Recently presented to urgent care 6/21 for R-sided headache, blurry vision and nausea x 4 days. HA improved with Tylenol. Denied photophobia. Recently seen by eye doctor 2-3 weeks prior and was prescribed contacts. Also reports impotence x 3-4 days. Hypertensive to 190/104 at [facility], sent home with plan for PCP follow up for hypertension. Presented to [facility] 6/23 with persistent HA, blurry vision and confusion for ~ 1 week with new onset of lightheadedness, SOB and CP. Also reported new onset of R arm weakness/paresthesias. Had acute onset of speech disturbance and gait changes 1 hour prior to presentation at [facility]. Hypertensive to 174/110. Neuro consulted and advised patient to present to ED for further workup.  Patient arrived to [facility] ED 6/23 and at that point had continued speech disturbance and blurry vision/unsteady gait. On ED arrival, hypertensive to 171/117, low grade temp 99.15F, HR 97, mildly disoriented. Labs were grossly unremarkable. CT Head 6/23 NAICA. MRI Brain 6/23 with multifocal cortical edema within both hemispheres, R > L, concerning for viral encephalitis. LP was completed in ED per Neuro recommendations and patient was started on empiric meningitis coverage (ceftriaxone, vanc, acyclovir). Meningitis/encephalitis panel including HSV 1/2 PCR, EBV, CMV and VDRL ordered as well as EEG. Patient admitted to [facility] service. CSF not consistent with infection, empiric steroids started for likely autoimmune/inflammatory process with EEG +seizure activity, Ativan and Keppra administered and LTM EEG started. ID was consulted with recommendation for discontinuation of antibiotics.  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 PCCM consulted for transfer to ICU, possible intubation for burst suppression to control status epilepticus and CVC placement for PLEX/IVIG.  Pertinent Medical History:   Past Medical History:  Diagnosis Date   ADD (attention deficit disorder)    Asthma    Significant Hospital Events: Including procedures, antibiotic start and stop dates in addition to other pertinent events   6/23 - Presented to ED from [facility] for persistent HA, blurry vision, hypertension, R-sided arm weakness/paresthesias. CT Head NAICA. MRI Brain with multifocal cortical edema within both hemispheres, R > L, concerning for viral encephalitis. Empiric meningitis coverage (CTX, vanc, acyclovir). LP not consistent with infection. 6/24 - LTM EEG with seizure activity. ID consulted, signed off after unlikely infectious etiology. 6/25 - PCCM consulted for ICU transfer, intubation for burst suppression, Trialysis catheter placement for PLEX/IVIG. 6/26 - LTM EEG/Prop for burst suppression, PLEX to start per Neuro.  Interim History / Subjective:  No significant events overnight Febrile this AM CXR with ?aspiration Will resume ceftriaxone LTM EEG in place, burst-suppressed on Prop PLEX to start today per Neuro  Objective:  Blood pressure 109/60, pulse 97, temperature 98.6 F (37 C), resp. rate 18, height 5\' 11"  (1.803 m), weight 90.7 kg, SpO2 95 %.    Vent Mode: PRVC FiO2 (%):  [40 %-100 %] 40 % Set Rate:  [18 bmp] 18 bmp Vt Set:  [560 mL-600 mL] 600 mL PEEP:  [5 cmH20] 5 cmH20 Plateau Pressure:  [15 cmH20-18 cmH20] 18 cmH20   Intake/Output Summary (Last 24 hours) at 11/20/2022 0858 Last data filed at 11/20/2022 0641 Gross per 24 hour  Intake 2506.91 ml  Output  3450 ml  Net -943.09 ml   Filed Weights   11/17/22 1628 11/17/22 2310  Weight: 75.6 kg 90.7 kg   Physical Examination: General: Acutely ill-appearing young  man in NAD. HEENT: [place]/AT, anicteric sclera, pupils pinpoint 1-61mm on Prop, moist mucous membranes. Neuro: Deeply sedated. Does not respond to verbal, tactile or noxious stimuli. No withdraw to pain in extremities. Not following commands. No spontaneous movement of extremities on exam.+Corneal, no cough/gag on Prop. CV: RRR, no m/g/r. PULM: Breathing even and unlabored on vent (PEEP 5, FiO2 40%). Lung fields CTAB. GI: Soft, nontender, nondistended. Normoactive bowel sounds. Extremities: No LE edema noted. Skin: Warm/dry, no rashes.  Resolved Hospital Problem List:    Assessment & Plan:  Acute, rapidly progressive encephalopathy  Seizures with concern for status epilepticus - Neurology following, appreciate recommendations - High level of suspicion for autoimmune encephalitis/inflammatory process, given grossly benign LP - Empiric meningitis coverage discontinued - F/u pending CSF studies - Continue steroids - LTM EEG - AEDs per Neuro, intubated for burst suppression - PLEX to begin today per Neuro (via Trialysis)  Acute hypoxemic respiratory failure in the setting of status epilepticus - Continue full vent support (4-8cc/kg IBW) - Wean FiO2 for O2 sat > 90% - Daily WUA/SBT once appropriate from a mental status standpoint - VAP bundle - Pulmonary hygiene - PAD protocol for sedation: Propofol, Fentanyl, and Versed for goal RASS -5 (burst suppression)  Hypotension, likely sedation related Febrile, concern for aspiration PNA - Goal MAP > 65 - Fluid resuscitation as tolerated - Levophed titrated to goal MAP - Trend WBC, fever curve - F/u Cx data - Resume ceftriaxone for lung coverage  Hypertension Newly hypertensive on presentation to [facility] 6/21 and 6/23 (SBP 170s-190s). - D/c clonidine patch - Hydralazine/labetalol IV PRN - Now hypotensive at present requiring pressors  History of ADD - Not on home medications  Best Practice: (right click and "Reselect all SmartList  Selections" daily)   Diet/type: NPO DVT prophylaxis: SCDs, Lovenox GI prophylaxis: PPI Lines: Dialysis Catheter + pigtail for PLEX Foley:  N/A Code Status:  full code Last date of multidisciplinary goals of care discussion [Pending]  Critical care time:    The patient is critically ill with multiple organ system failure and requires high complexity decision making for assessment and support, frequent evaluation and titration of therapies, advanced monitoring, review of radiographic studies and interpretation of complex data.   Critical Care Time devoted to patient care services, exclusive of separately billable procedures, described in this note is 34 minutes.  Tim Lair, PA-C [facility] 11/20/22 8:58 AM  Please see [email or web address] for pager details.  From 7A-7P if no response, please call [phone] After hours, please call ELink [phone]

## 2022-11-19 NOTE — Procedures (Signed)
Patient Name: Colton Zhang  MRN: [id number]  Epilepsy Attending: Charlsie Quest  Referring Physician/Provider: Charlsie Quest, MD  Duration: 11/18/2022 1016 to 11/19/2022 1016   Patient history: 22 y.o. male with no significant PMH who presents with progressive confusion, lethargy, headache over the last week. MRI Brain notable for cortical edema within both hemispheres, mostly in right though. EEG to evaluate for seizure   Level of alertness: Awake, asleep   AEDs during EEG study: None   Technical aspects: This EEG study was done with scalp electrodes positioned according to the 10-20 International system of electrode placement. Electrical activity was reviewed with band pass filter of 1-70Hz , sensitivity of 7 uV/mm, display speed of 62mm/sec with a 60Hz  notched filter applied as appropriate. EEG data were recorded continuously and digitally stored.  Video monitoring was not available.   Description: EEG showed continuous generalized and lateralized right hemisphere 3 to 5 Hz theta-delta slowing.  Sleep was characterized by sleep spindles (12 to 15 Hz), maximal frontocentral region.    Lateralized periodic discharges ( LPD) were noted in right hemisphere, maximal  right centro-temporo-parietal region.  These lateralized periodic discharges had fluctuating frequency of 1 to 2.5 Hz with overriding fast activity and at times overriding rhythmicity.  Seizures without clinical signs were noted on 11/19/2022 at 1505, 1538, 1640,1710, 2252 and on 11/19/2022 0749, 0830. EEG showed sharply contoured 5 to 6 Hz theta slowing in right posterior temporal region which then evolved into 7 to 8 Hz sharply contoured alpha activity and involved all of right hemisphere as well as left posterior quadrant.  Average duration of seizures was about 2-3 minutes.  Event button was pressed on 11/19/2022 at 0927.  Per neurologist patient was noted to have left facial twitching and not responding.  Concomitant EEG showed  lateralized periodic discharges with overriding fast activity and rhythmicity in right hemisphere, maximal right centro-temporo-parietal region with evolution in morphology and frequency.  This was consistent with focal motor seizure arising from  right centro-temporo-parietal region. Duration of seizure was about 2 minutes   Hyperventilation and photic stimulation were not performed.      ABNORMALITY - Focal seizure, centro-temporo-parietal region. -  Lateralized periodic discharges ( LPD +F+R), right hemisphere, maximal  right centro-temporo-parietal region - Continuous slow, generalized and lateralized right hemisphere    IMPRESSION: This study showed focal motor seizure arising from right centro-temporo-parietal region during which patient was noted to have left facial twitching and speech arrest on 11/19/2022 at 0927, lasting about 2 minutes. Seven seizures without clinical signs were also noted arising from right centro-temporo-parietal region as described above.  Additionally there is evidence of epileptogenicity and cortical dysfunction arising from right hemisphere, maximal  right centro-temporo-parietal region. This EEG pattern is on the ictal-interictal continuum.  Due to the overlying fast activity and rhythmicity, the EEG pattern is more concerning for ictal nature. Lastly there is moderate diffuse encephalopathy.   Dr. [name] was notified.   Shaunita Seney [name]

## 2022-11-19 NOTE — Consult Note (Addendum)
NAME:  Colton Zhang, MRN:  [id number], DOB:  August 05, 2000, LOS: 1 ADMISSION DATE:  11/17/2022, CONSULTATION DATE:  11/19/22 REFERRING MD:  [name], CHIEF COMPLAINT:  confusion   History of Present Illness:  22 year old man w/ hx of asthma p/w 1 week HA, BV, N/V and increasing confusion.  Workup has revealed MRI findings concerning for inflammatory encephalitis.  Rapid PCR neg.  Patient can intermittently follow commands then becomes unresponsive during interview.  Plan is intubation for burst suppression and PLEX.  Pertinent  Medical History  Asthma ADHD  Significant Hospital Events: Including procedures, antibiotic start and stop dates in addition to other pertinent events   11/18/22 admit  Interim History / Subjective:  Consulted  Objective   Blood pressure (!) 155/80, pulse (!) 104, temperature 99 F (37.2 C), temperature source Oral, resp. rate 16, height 5\' 11"  (1.803 m), weight 90.7 kg, SpO2 99 %.        Intake/Output Summary (Last 24 hours) at 11/19/2022 1646 Last data filed at 11/19/2022 0340 Gross per 24 hour  Intake 592.16 ml  Output 800 ml  Net -207.84 ml   Filed Weights   11/17/22 1628 11/17/22 2310  Weight: 75.6 kg 90.7 kg    Examination: General: well appearing man but poorly responsive HENT: MMM, trachea midline Lungs: clear, no accessory muscle use Cardiovascular: RRR, ext warm Abdomen: soft, +BS Extremities: no edema Neuro: he will follow commands x 4 intermittently, some twitching of eyelids noted intermittently Skin: no rashes  Mild AKI noted CSF and MRI reviewed  Resolved Hospital Problem list   N/A  Assessment & Plan:  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  Acute kidney injury mild  Hx asthma/COPD  - Transfer to ICU - Intubation for burst suppression - HD line for PLEX - Steroids AEDs and cEEG per neuro - Mother and brother updated and consent  Best Practice (right click and "Reselect all SmartList  Selections" daily)   Diet/type: NPO DVT prophylaxis: LMWH GI prophylaxis: PPI Lines: Central line Foley:  N/A Code Status:  full code Last date of multidisciplinary goals of care discussion [updated mother/brother bedside]  Labs   CBC: Recent Labs  Lab 11/15/22 1041 11/17/22 1638 11/18/22 0446  WBC 6.0 8.1 8.6  HGB 16.6 17.0 16.3  HCT 49.0 51.0 48.0  MCV 79.8* 78.2* 80.0  PLT 364 380 327    Basic Metabolic Panel: Recent Labs  Lab 11/15/22 1041 11/17/22 1638 11/18/22 0446 11/19/22 0346  NA 139 135 134* 135  K 3.2* 3.7 3.8 4.3  CL 100 94* 98 99  CO2 27 27 25  21*  GLUCOSE 137* 132* 106* 169*  BUN 6 11 12 14   CREATININE 1.17 1.17 1.08 1.32*  CALCIUM 9.7 9.7 9.3 9.8   GFR: Estimated Creatinine Clearance: 102.9 mL/min (A) (by C-G formula based on SCr of 1.32 mg/dL (H)). Recent Labs  Lab 11/15/22 1041 11/17/22 1638 11/18/22 0446  WBC 6.0 8.1 8.6    Liver Function Tests: Recent Labs  Lab 11/17/22 1638  AST 31  ALT 38  ALKPHOS 79  BILITOT 0.8  PROT 8.4*  ALBUMIN 4.6   Recent Labs  Lab 11/17/22 1638  LIPASE 29   Recent Labs  Lab 11/17/22 1813  AMMONIA 15    ABG No results found for: "PHART", "PCO2ART", "PO2ART", "HCO3", "TCO2", "ACIDBASEDEF", "O2SAT"   Coagulation Profile: No results for input(s): "INR", "PROTIME" in the last 168 hours.  Cardiac Enzymes: No results for input(s): "CKTOTAL", "CKMB", "CKMBINDEX", "TROPONINI" in  the last 168 hours.  HbA1C: No results found for: "HGBA1C"  CBG: Recent Labs  Lab 11/17/22 1812  GLUCAP 122*    Review of Systems:   Too encephalopathic  Past Medical History:  He,  has a past medical history of ADD (attention deficit disorder) and Asthma.   Surgical History:   Past Surgical History:  Procedure Laterality Date   CIRCUMCISION       Social History:   reports that he has never smoked. He has never used smokeless tobacco. He reports that he does not drink alcohol and does not use drugs.    Family History:  His family history includes Cirrhosis in his father; Heart failure in his paternal grandmother; Migraines in his brother; Stroke in his paternal grandfather.   Allergies Allergies  Allergen Reactions   Other     Seasonal Allergies     Home Medications  Prior to Admission medications   Not on File     Critical care time: 32 mins independent of procedures

## 2022-11-19 NOTE — Procedures (Signed)
Intubation Procedure Note  Kenrick Pore [facility]  [id number]  2000/06/07  Date:11/19/22  Time:6:06 PM   Provider Performing:Saad Buhl Zenaida Niece    Procedure: Intubation (31500)  Indication(s) Respiratory Failure  Consent Risks of the procedure as well as the alternatives and risks of each were explained to the patient and/or caregiver.  Consent for the procedure was obtained and is signed in the bedside chart   Anesthesia    Time Out Verified patient identification, verified procedure, site/side was marked, verified correct patient position, special equipment/implants available, medications/allergies/relevant history reviewed, required imaging and test results available.   Sterile Technique Usual hand hygeine, masks, and gloves were used   Procedure Description Patient positioned in bed supine.  Sedation given as noted above.  Patient was intubated with endotracheal tube using Glidescope.  View was Grade 1 full glottis .  Number of attempts was 1.  Colorimetric CO2 detector was consistent with tracheal placement.   Complications/Tolerance None; patient tolerated the procedure well. Chest X-ray is ordered to verify placement.   EBL Minimal   Specimen(s) None

## 2022-11-19 NOTE — Progress Notes (Signed)
   11/18/22 2320  Assess: MEWS Score  Temp 99.5 F (37.5 C)  BP (!) 162/100  MAP (mmHg) 118  Pulse Rate (!) 117  Resp 18  SpO2 100 %  O2 Device Room Air  Assess: MEWS Score  MEWS Temp 0  MEWS Systolic 0  MEWS Pulse 2  MEWS RR 0  MEWS LOC 0  MEWS Score 2  MEWS Score Color Yellow  Assess: if the MEWS score is Yellow or Red  Were vital signs taken at a resting state? Yes  Focused Assessment Change from prior assessment (see assessment flowsheet)  Does the patient meet 2 or more of the SIRS criteria? No  MEWS guidelines implemented  Yes, yellow  Treat  MEWS Interventions Considered administering scheduled or prn medications/treatments as ordered  Take Vital Signs  Increase Vital Sign Frequency  Yellow: Q2hr x1, continue Q4hrs until patient remains green for 12hrs  Escalate  MEWS: Escalate Yellow: Discuss with charge nurse and consider notifying provider and/or RRT  Notify: Charge Nurse/RN  Name of Charge Nurse/RN Notified Phyl, RN  Assess: SIRS CRITERIA  SIRS Temperature  0  SIRS Pulse 1  SIRS Respirations  0  SIRS WBC 0  SIRS Score Sum  1

## 2022-11-20 ENCOUNTER — Inpatient Hospital Stay (HOSPITAL_COMMUNITY): Payer: BC Managed Care – PPO

## 2022-11-20 DIAGNOSIS — G0490 Encephalitis and encephalomyelitis, unspecified: Secondary | ICD-10-CM | POA: Diagnosis not present

## 2022-11-20 DIAGNOSIS — A86 Unspecified viral encephalitis: Secondary | ICD-10-CM | POA: Diagnosis not present

## 2022-11-20 LAB — GLUCOSE, CAPILLARY
Glucose-Capillary: 140 mg/dL — ABNORMAL HIGH (ref 70–99)
Glucose-Capillary: 164 mg/dL — ABNORMAL HIGH (ref 70–99)
Glucose-Capillary: 159 mg/dL — ABNORMAL HIGH (ref 70–99)
Glucose-Capillary: 155 mg/dL — ABNORMAL HIGH (ref 70–99)
Glucose-Capillary: 144 mg/dL — ABNORMAL HIGH (ref 70–99)

## 2022-11-20 LAB — MISC LABCORP TEST (SEND OUT): Labcorp test code: 505625

## 2022-11-20 LAB — BASIC METABOLIC PANEL
Anion gap: 19 — ABNORMAL HIGH (ref 5–15)
BUN: 25 mg/dL — ABNORMAL HIGH (ref 6–20)
CO2: 24 mmol/L (ref 22–32)
Calcium: 9.5 mg/dL (ref 8.9–10.3)
Chloride: 96 mmol/L — ABNORMAL LOW (ref 98–111)
Creatinine, Ser: 1.53 mg/dL — ABNORMAL HIGH (ref 0.61–1.24)
GFR, Estimated: >60 mL/min (ref >60)
Glucose, Bld: 150 mg/dL — ABNORMAL HIGH (ref 70–99)
Potassium: 4.9 mmol/L (ref 3.5–5.1)
Sodium: 139 mmol/L (ref 135–145)

## 2022-11-20 LAB — CBC
HCT: 42.9 % (ref 39.0–52.0)
Hemoglobin: 14.4 g/dL (ref 13.0–17.0)
MCH: 26.6 pg (ref 26.0–34.0)
MCHC: 33.6 g/dL (ref 30.0–36.0)
MCV: 79.2 fL — ABNORMAL LOW (ref 80.0–100.0)
Platelets: 383 10*3/uL (ref 150–400)
RBC: 5.42 MIL/uL (ref 4.22–5.81)
RDW: 12.2 % (ref 11.5–15.5)
WBC: 9.9 10*3/uL (ref 4.0–10.5)
nRBC: 0 % (ref 0.0–0.2)

## 2022-11-20 LAB — CSF CULTURE W GRAM STAIN: Gram Stain: NONE SEEN

## 2022-11-20 LAB — PHOSPHORUS
Phosphorus: 6.4 mg/dL — ABNORMAL HIGH (ref 2.5–4.6)
Phosphorus: 5.9 mg/dL — ABNORMAL HIGH (ref 2.5–4.6)

## 2022-11-20 LAB — PROTIME-INR
INR: 1.2 (ref 0.8–1.2)
Prothrombin Time: 15.2 seconds (ref 11.4–15.2)

## 2022-11-20 LAB — CMV DNA BY PCR, QUALITATIVE: CMV DNA, Qual PCR: NEGATIVE

## 2022-11-20 LAB — MAGNESIUM
Magnesium: 2.8 mg/dL — ABNORMAL HIGH (ref 1.7–2.4)
Magnesium: 2.8 mg/dL — ABNORMAL HIGH (ref 1.7–2.4)

## 2022-11-20 LAB — TRIGLYCERIDES: Triglycerides: 117 mg/dL (ref <150)

## 2022-11-20 MED ORDER — ACD FORMULA A 0.73-2.45-2.2 GM/100ML VI SOLN
1000.0000 mL | Status: DC
  Administered 2022-11-20: 1000 mL
  Filled 2022-11-20: qty 1000

## 2022-11-20 MED ORDER — ACETAMINOPHEN 325 MG PO TABS
650.0000 mg | ORAL_TABLET | ORAL | Status: DC | PRN
  Administered 2022-11-20 – 2022-11-24 (×6): 650 mg
  Filled 2022-11-20 (×5): qty 2

## 2022-11-20 MED ORDER — ACD FORMULA A 0.73-2.45-2.2 GM/100ML VI SOLN
Status: AC
  Administered 2022-11-20: 1000 mL
  Filled 2022-11-20: qty 1000

## 2022-11-20 MED ORDER — SODIUM CHLORIDE 0.9 % IV SOLN
INTRAVENOUS | Status: AC
  Filled 2022-11-20 (×5): qty 200

## 2022-11-20 MED ORDER — SODIUM CHLORIDE 0.9 % IV SOLN
2.0000 g | INTRAVENOUS | Status: AC
  Administered 2022-11-20 – 2022-11-24 (×5): 2 g via INTRAVENOUS
  Filled 2022-11-20 (×5): qty 20

## 2022-11-20 MED ORDER — DIPHENHYDRAMINE HCL 25 MG PO CAPS
25.0000 mg | ORAL_CAPSULE | Freq: Four times a day (QID) | ORAL | Status: DC | PRN
  Administered 2022-11-20 – 2022-11-23 (×2): 25 mg
  Filled 2022-11-20 (×2): qty 1

## 2022-11-20 MED ORDER — CALCIUM CARBONATE ANTACID 500 MG PO CHEW
2.0000 | CHEWABLE_TABLET | ORAL | Status: AC
  Administered 2022-11-20 (×2): 400 mg
  Filled 2022-11-20 (×2): qty 2

## 2022-11-20 MED ORDER — MIDAZOLAM BOLUS VIA INFUSION
10.0000 mg | Freq: Once | INTRAVENOUS | Status: AC
  Administered 2022-11-20: 10 mg via INTRAVENOUS
  Filled 2022-11-20: qty 10

## 2022-11-20 MED ORDER — OSMOLITE 1.5 CAL PO LIQD
1000.0000 mL | ORAL | Status: DC
  Administered 2022-11-20 – 2022-11-26 (×8): 1000 mL
  Filled 2022-11-20: qty 1000

## 2022-11-20 MED ORDER — PROSOURCE TF20 ENFIT COMPATIBL EN LIQD
60.0000 mL | Freq: Two times a day (BID) | ENTERAL | Status: DC
  Administered 2022-11-20 – 2022-11-27 (×15): 60 mL
  Filled 2022-11-20 (×15): qty 60

## 2022-11-20 MED ORDER — SODIUM CHLORIDE 0.9 % IV SOLN
Freq: Once | INTRAVENOUS | Status: DC
Start: 2022-11-20 — End: 2022-11-20

## 2022-11-20 MED ORDER — CALCIUM GLUCONATE-NACL 2-0.675 GM/100ML-% IV SOLN
2.0000 g | Freq: Once | INTRAVENOUS | Status: AC
  Administered 2022-11-20: 2000 mg via INTRAVENOUS
  Filled 2022-11-20: qty 100

## 2022-11-20 MED ORDER — HEPARIN SODIUM (PORCINE) 1000 UNIT/ML IJ SOLN
1000.0000 [IU] | Freq: Once | INTRAMUSCULAR | Status: AC
  Administered 2022-11-20: 1000 [IU]
  Filled 2022-11-20: qty 1

## 2022-11-20 NOTE — Progress Notes (Signed)
eLink Physician-Brief Progress Note Patient Name: Colton Zhang [facility] DOB: 2000-08-25 MRN: [id number]   Date of Service  11/20/2022  HPI/Events of Note  Bladder scan >800. Pt Intubated on burst suppression  eICU Interventions  Place foley since it would be better for accurate I/O as well      Intervention Category Major Interventions: Respiratory failure - evaluation and management  [name] [name] 11/20/2022, 2:04 AM

## 2022-11-20 NOTE — Procedures (Signed)
Patient Name: Colton Zhang  MRN: [id number]  Epilepsy Attending: Charlsie Quest  Referring Physician/Provider: Charlsie Quest, MD  Duration: 11/19/2022 1016 to 11/20/2022 1016   Patient history: 22 y.o. male with no significant PMH who presents with progressive confusion, lethargy, headache over the last week. MRI Brain notable for cortical edema within both hemispheres, mostly in right though. EEG to evaluate for seizure   Level of alertness: Awake, asleep-->comatose   AEDs during EEG study: LEV, LCM, Propofol, Versed   Technical aspects: This EEG study was done with scalp electrodes positioned according to the 10-20 International system of electrode placement. Electrical activity was reviewed with band pass filter of 1-70Hz , sensitivity of 7 uV/mm, display speed of 44mm/sec with a 60Hz  notched filter applied as appropriate. EEG data were recorded continuously and digitally stored.  Video monitoring was not available.   Description: At the beginning of the study, EEG showed continuous generalized and lateralized right hemisphere 3 to 5 Hz theta-delta slowing.  Sleep was characterized by sleep spindles (12 to 15 Hz), maximal frontocentral region.  Lateralized periodic discharges ( LPD) were noted in right hemisphere, maximal  right centro-temporo-parietal region.  These lateralized periodic discharges had fluctuating frequency of 1 to 2.5 Hz with overriding fast activity and at times overriding rhythmicity.   Event button was pressed on 11/19/2022 at 1632.  Per neurologist patient was not responding for most of the day but did tell his name correctly during this time.  Concomitant EEG continues to show lateralized periodic discharges with overriding fast activity and rhythmicity in right hemisphere, maximal right centro-temporo-parietal region with evolution in morphology and frequency.    Patient was started on propofol and Versed on 11/19/2022 at around 1900. Subsequently, EEG showed burst  suppression pattern with bursts of 2 to 3 Hz delta slowing admixed with 12 to 13 Hz beta activity lasting 1.5 to 2 seconds alternating with 4 to 6 seconds of generalized EEG suppression.   Hyperventilation and photic stimulation were not performed.      ABNORMALITY -  Lateralized periodic discharges ( LPD +F+R), right hemisphere, maximal  right centro-temporo-parietal region - Continuous slow, generalized and lateralized right hemisphere  -Burst suppression, generalized   IMPRESSION: At the beginning of the study, EEG showed evidence of epileptogenicity and cortical dysfunction arising from right hemisphere, maximal  right centro-temporo-parietal region. This EEG pattern is on the ictal-interictal continuum.  Due to the overlying fast activity and rhythmicity, the EEG pattern is more concerning for ictal nature.  Patient was started on propofol and Versed on 11/19/2022 at around 1900.  Subsequently EEG improved and was suggestive of profound diffuse encephalopathy, most likely secondary to sedation.   Dr. [name] was notified.

## 2022-11-20 NOTE — Progress Notes (Addendum)
Initial Nutrition Assessment  DOCUMENTATION CODES:   Not applicable  INTERVENTION:   Initiate enteral nutrition via OG tube: - Start Osmolite 1.5 @ 40 ml/hr and advance rate by 10 ml q 6 hours to goal rate of 60 ml/hr (1440 ml/day) - PROSource TF20 60 ml BID  Tube feeding regimen at goal rate provides 2320 kcal, 130 grams of protein, and 1097 ml of H2O.  NUTRITION DIAGNOSIS:   Inadequate oral intake related to inability to eat as evidenced by NPO status.  GOAL:   Patient will meet greater than or equal to 90% of their needs  MONITOR:   Vent status, Labs, Weight trends, TF tolerance  REASON FOR ASSESSMENT:   Ventilator, Consult Enteral/tube feeding initiation and management  ASSESSMENT:   22 year old male who presented to the ED on 6/23 with AMS, blurry vision, headache, nausea. PMH of asthma, ADD. Pt admitted with rapidly progressing acute encephalopathy, hypertensive urgency, seizures.  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 Pt is heavily sedated for burst suppression. PLEX starting today. Per CCM, tentatively start to wean propofol tomorrow. Concern for possible aspiration.  Consult received for enteral nutrition initiation and management. Pt with OG tube tip overlying gastric fundus.  No family present at time of RD visit. Unable to obtain diet and weight history at this time. Reviewed weight history in chart. Pt with ~15 kg weight gain since last available weight on 01/13/2017. No subcutaneous fat or muscle depletions noted on NFPE.  Patient is currently intubated on ventilator support MV: 10.8 L/min Temp (24hrs), Avg:99.1 F (37.3 C), Min:98.6 F (37 C), Max:100.6 F (38.1 C)  Drips: Propofol: 80 mcg/kg/min (provides 1148 kcal daily from lipid) Fentanyl Versed Levophed: 1 mcg/min LR: 100 ml/hr  Medications reviewed and  include: calcium carbonate 2 tablets x 2, colace, SSI q 4 hours, IV protonix, miralax, IV calcium gluconate 2 grams x 1, IV abx, IV solu-medrol, IV abx  Labs reviewed: BUN 25, creatinine 1.53, phosphorus 6.4, magnesium 2.8 CBG's: 140-162 x 24 hours  UOP: 3450 ml x 24 hours  NUTRITION - FOCUSED PHYSICAL EXAM:  Flowsheet Row Most Recent Value  Orbital Region No depletion  Upper Arm Region No depletion  Thoracic and Lumbar Region No depletion  Buccal Region Unable to assess  Temple Region No depletion  Clavicle Bone Region No depletion  Clavicle and Acromion Bone Region No depletion  Scapular Bone Region No depletion  Dorsal Hand No depletion  Patellar Region No depletion  Anterior Thigh Region No depletion  Posterior Calf Region No depletion  Edema (RD Assessment) None  Hair Reviewed  Eyes Reviewed  Mouth Reviewed  Skin Reviewed  Nails Reviewed    Diet Order:   Diet Order             Diet NPO time specified  Diet effective now                   EDUCATION NEEDS:   No education needs have been identified at this time  Skin:  Skin Assessment: Reviewed RN Assessment  Last BM:  no documented BM  Height:   Ht Readings from Last 1 Encounters:  11/19/22 5\' 11"  (1.803 m)    Weight:   Wt Readings from Last 1 Encounters:  11/17/22 90.7 kg    BMI:  Body mass index is 27.89 kg/m.  Estimated Nutritional Needs:   Kcal:  2300-2500  Protein:  125-140 grams  Fluid:  >2.2 L    Mertie Clause, MS, RD, LDN Inpatient Clinical Dietitian Please see AMiON for contact information.

## 2022-11-20 NOTE — Progress Notes (Signed)
LTM EEG reviewed from 10:00 PM to 11:20 PM. Continued burst suppression. No electrographic seizures seen.   Electronically signed: Dr. Caryl Pina

## 2022-11-20 NOTE — TOC CM/SW Note (Signed)
Transition of Care [facility]) - Inpatient Brief Assessment   Patient Details  Name: Colton Zhang MRN: [id number] Date of Birth: 2000-08-22  Transition of Care [facility]) CM/SW Contact:    Glennon Mac, RN Phone Number: 11/20/2022, 4:07 PM   Clinical Narrative: Patient admitted on 11/17/22 with acute rapidly progressive encephalopathy, seizures, and respiratory failure.  PTA, pt independent and living at home with parent.  Patient currently heavily sedated and on vent; TOC will continue to follow as patient progresses.    Transition of Care Asessment: Insurance and Status: Selfpay Patient has primary care physician: No Home environment has been reviewed: Lives with parent Prior level of function:: Independent Prior/Current Home Services: No current home services Social Determinants of Health Reivew: SDOH reviewed no interventions necessary Readmission risk has been reviewed: Yes Transition of care needs: transition of care needs identified, TOC will continue to follow  Quintella Baton, RN, BSN  Trauma/Neuro ICU Case Manager [phone]

## 2022-11-20 NOTE — Progress Notes (Signed)
Subjective: Intubated and sedated, had fever overnight  Exam: Vitals:   11/20/22 0700 11/20/22 0753  BP: 109/60   Pulse: 96 97  Resp: 18 18  Temp:  98.6 F (37 C)  SpO2: 97% 95%   Gen: In bed, intubated Resp: ventilated   Neuro: PERRL, further neuro eval precluded by induced coma  Pertinent Labs: CSF RBC 643 CSF WBC seven CSF protein 18 CSF glucose 80  Biofire meningitis/encephalitis panel-negative VZV PCR-pending HSV PCR-negative on bio fire panel, but separate test is pending CSF JC virus-pending VDRL-pending CSF CMV by PCR-negative CSF EBV-pending   CSF autoimmune encephalitis panel-pending CSF IgG index-pending   CRP-less than 0.2 ESR-2 ANA-negative ANCA - negative Thyroglobulin-pending Thyroid peroxidase - negative SSA/SSB-negative Serum autoimmune encephalitis panel-negative  RPR-negative HIV-negative Ammonia 15 TSH 1.5 Admission creatinine 1.17 UDS - negative  Creatinine is increasing   Impression: 22 year old male with fairly rapidly progressive encephalopathy and refractory seizures amounting to localized new onset refractory status epilepticus with multifocal areas of T2 signal change and a single area of restricted diffusion on MRI.  With relatively bland CSF, no fever or leukocytosis, I think that infectious etiology is very unlikely.  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  He is currently undergoing burst suppression given the refractory nature of his seizures.  Recommendations: 1) continue Keppra at 1500 twice daily 2) continue Depacon 500 3 times daily, check level 3) lacosamide 100 mg twice daily 4) continue Solu-Medrol 1 g daily, day 3/5 5) plasmapheresis day 1/5 6) neurology will continue to follow  This patient is critically ill and at significant risk of neurological worsening,  death and care requires constant monitoring of vital signs, hemodynamics,respiratory and cardiac monitoring, neurological assessment, discussion with family, other specialists and medical decision making of high complexity. I spent 35 minutes of neurocritical care time  in the care of  this patient. This was time spent independent of any time provided by nurse practitioner or PA.  Ritta Slot, MD [facility] [phone]  If 7pm- 7am, please page neurology on call as listed in AMION. 11/20/2022  8:44 AM

## 2022-11-20 NOTE — Progress Notes (Signed)
   11/20/22 1311  Vitals  Temp (!) 97.5 F (36.4 C)  Temp Source Axillary  Pulse Rate 84  ECG Heart Rate 84  Resp 18  Oxygen Therapy  SpO2 98 %  O2 Device Ventilator  FiO2 (%) 40 %  Patient Activity (if Appropriate) In bed  Pulse Oximetry Type Continuous  Pain Assessment  Pain Scale CPOT  Critical Care Pain Observation Tool (CPOT)  Facial Expression 0  Body Movements 0  Muscle Tension 0  Compliance with ventilator (intubated pts.) 0  Vocalization (extubated pts.) N/A  CPOT Total 0  Apheresis   Procedure Comments Tx. Completed. VSS. Report given to Bedside RN Oralia Manis RN  Post-apheresis  Net Removed (mL) 3911 mL  Replacement (mL) 2988 mL  ACDA infused (mL) 774 mL  Total Normal Saline Administered 171 mL  Total Calcium Administered 100 mL  Tolerated Procedure Yes  Post-Procedure Comments Pt. tolerated Procedure well.  Hemodialysis Catheter Right Internal jugular Triple lumen Temporary (Non-Tunneled)  Placement Date/Time: 11/19/22 1830   Time Out: Correct patient;Correct procedure;Correct site  Maximum sterile barrier precautions: Hand hygiene;Cap;Mask;Sterile gown;Sterile gloves;Large sterile sheet  Site Prep: Chlorhexidine (preferred)  Local Anes...  Site Condition No complications  Blue Lumen Status Infusing;Flushed;Heparin locked;Blood return noted;Dead end cap in place  Red Lumen Status Infusing;Flushed;Heparin locked;Dead end cap in place;Blood return noted  Purple Lumen Status Saline locked  Catheter fill solution Heparin 1000 units/ml  Catheter fill volume (Arterial) 1.2 cc  Catheter fill volume (Venous) 1.2  Dressing Type Transparent  Dressing Status Antimicrobial disc in place  Drainage Description None  Dressing Change Due 11/27/22  Post treatment catheter status Capped and Clamped

## 2022-11-20 NOTE — Procedures (Signed)
Cortrak  Person Inserting Tube:  Takeshi Teasdale T, RD Tube Type:  Cortrak - 43 inches Tube Size:  10 Tube Location:  Left nare Secured by: Bridle Technique Used to Measure Tube Placement:  Marking at nare/corner of mouth Cortrak Secured At:  70 cm   Cortrak Tube Team Note:  Consult received to place a Cortrak feeding tube.   X-ray is required, abdominal x-ray has been ordered by the Cortrak team. Please confirm tube placement before using the Cortrak tube.   If the tube becomes dislodged please keep the tube and contact the Cortrak team at [email or web address] for replacement.  If after hours and replacement cannot be delayed, place a NG tube and confirm placement with an abdominal x-ray.    Colton Zhang RD, LDN For contact information, refer to AMiON.   

## 2022-11-21 ENCOUNTER — Other Ambulatory Visit: Payer: Self-pay

## 2022-11-21 ENCOUNTER — Inpatient Hospital Stay (HOSPITAL_COMMUNITY): Payer: BC Managed Care – PPO

## 2022-11-21 DIAGNOSIS — A86 Unspecified viral encephalitis: Secondary | ICD-10-CM | POA: Diagnosis not present

## 2022-11-21 DIAGNOSIS — G0490 Encephalitis and encephalomyelitis, unspecified: Secondary | ICD-10-CM | POA: Diagnosis not present

## 2022-11-21 LAB — GLUCOSE, CAPILLARY
Glucose-Capillary: 152 mg/dL — ABNORMAL HIGH (ref 70–99)
Glucose-Capillary: 149 mg/dL — ABNORMAL HIGH (ref 70–99)
Glucose-Capillary: 156 mg/dL — ABNORMAL HIGH (ref 70–99)
Glucose-Capillary: 142 mg/dL — ABNORMAL HIGH (ref 70–99)
Glucose-Capillary: 136 mg/dL — ABNORMAL HIGH (ref 70–99)
Glucose-Capillary: 140 mg/dL — ABNORMAL HIGH (ref 70–99)
Glucose-Capillary: 165 mg/dL — ABNORMAL HIGH (ref 70–99)

## 2022-11-21 LAB — BASIC METABOLIC PANEL
Anion gap: 9 (ref 5–15)
BUN: 28 mg/dL — ABNORMAL HIGH (ref 6–20)
CO2: 27 mmol/L (ref 22–32)
Calcium: 8.5 mg/dL — ABNORMAL LOW (ref 8.9–10.3)
Chloride: 106 mmol/L (ref 98–111)
Creatinine, Ser: 1.28 mg/dL — ABNORMAL HIGH (ref 0.61–1.24)
GFR, Estimated: >60 mL/min (ref >60)
Glucose, Bld: 156 mg/dL — ABNORMAL HIGH (ref 70–99)
Potassium: 5.2 mmol/L — ABNORMAL HIGH (ref 3.5–5.1)
Sodium: 142 mmol/L (ref 135–145)

## 2022-11-21 LAB — CBC
HCT: 39.4 % (ref 39.0–52.0)
Hemoglobin: 13 g/dL (ref 13.0–17.0)
MCH: 26.4 pg (ref 26.0–34.0)
MCHC: 33 g/dL (ref 30.0–36.0)
MCV: 80.1 fL (ref 80.0–100.0)
Platelets: 305 10*3/uL (ref 150–400)
RBC: 4.92 MIL/uL (ref 4.22–5.81)
RDW: 12.2 % (ref 11.5–15.5)
WBC: 11.6 10*3/uL — ABNORMAL HIGH (ref 4.0–10.5)
nRBC: 0 % (ref 0.0–0.2)

## 2022-11-21 LAB — RESPIRATORY PANEL BY PCR

## 2022-11-21 LAB — MAGNESIUM
Magnesium: 2.9 mg/dL — ABNORMAL HIGH (ref 1.7–2.4)
Magnesium: 2.8 mg/dL — ABNORMAL HIGH (ref 1.7–2.4)

## 2022-11-21 LAB — PHOSPHORUS
Phosphorus: 4.9 mg/dL — ABNORMAL HIGH (ref 2.5–4.6)
Phosphorus: 4.1 mg/dL (ref 2.5–4.6)

## 2022-11-21 LAB — VALPROIC ACID LEVEL: Valproic Acid Lvl: 58 ug/mL (ref 50.0–100.0)

## 2022-11-21 LAB — THYROGLOBULIN ANTIBODY: Thyroglobulin Antibody: <1 IU/mL (ref 0.0–0.9)

## 2022-11-21 LAB — MISC LABCORP TEST (SEND OUT): Labcorp test code: 138289

## 2022-11-21 LAB — CSF CULTURE W GRAM STAIN: Culture: NO GROWTH

## 2022-11-21 MED ORDER — KETAMINE HCL-SODIUM CHLORIDE 1000-0.9 MG/100ML-% IV SOLN
2.0000 mg/kg/h | INTRAVENOUS | Status: DC
  Administered 2022-11-21 (×2): 2 mg/kg/h via INTRAVENOUS
  Filled 2022-11-21 (×2): qty 100

## 2022-11-21 MED ORDER — HEPARIN SODIUM (PORCINE) 1000 UNIT/ML IJ SOLN
1000.0000 [IU] | Freq: Once | INTRAMUSCULAR | Status: AC
  Administered 2022-11-21: 1000 [IU]

## 2022-11-21 MED ORDER — DIPHENHYDRAMINE HCL 25 MG PO CAPS: 25.0000 mg | ORAL_CAPSULE | Freq: Four times a day (QID) | ORAL | Status: DC | PRN

## 2022-11-21 MED ORDER — ACD FORMULA A 0.73-2.45-2.2 GM/100ML VI SOLN
1000.0000 mL | Status: DC
  Administered 2022-11-21: 1000 mL
  Filled 2022-11-21: qty 1000

## 2022-11-21 MED ORDER — DOPAMINE-DEXTROSE 3.2-5 MG/ML-% IV SOLN
0.0000 ug/kg/min | INTRAVENOUS | Status: DC
  Administered 2022-11-21 – 2022-11-22 (×2): 5 ug/kg/min via INTRAVENOUS
  Filled 2022-11-21 (×2): qty 250

## 2022-11-21 MED ORDER — MIDAZOLAM BOLUS VIA INFUSION
10.0000 mg | Freq: Once | INTRAVENOUS | Status: AC
  Administered 2022-11-21: 10 mg via INTRAVENOUS
  Filled 2022-11-21: qty 10

## 2022-11-21 MED ORDER — MIDAZOLAM BOLUS VIA INFUSION
10.0000 mg | INTRAVENOUS | Status: AC
  Administered 2022-11-21 (×3): 10 mg via INTRAVENOUS
  Filled 2022-11-21 (×3): qty 10

## 2022-11-21 MED ORDER — SODIUM CHLORIDE 0.9 % IV SOLN
50.0000 mg/h | INTRAVENOUS | Status: DC
  Filled 2022-11-21: qty 50

## 2022-11-21 MED ORDER — PHENOBARBITAL SODIUM 130 MG/ML IJ SOLN
97.5000 mg | Freq: Two times a day (BID) | INTRAMUSCULAR | Status: DC
  Administered 2022-11-21 – 2022-11-27 (×13): 97.5 mg via INTRAVENOUS
  Filled 2022-11-21 (×13): qty 1

## 2022-11-21 MED ORDER — CALCIUM GLUCONATE-NACL 2-0.675 GM/100ML-% IV SOLN
INTRAVENOUS | Status: AC
  Filled 2022-11-21: qty 100

## 2022-11-21 MED ORDER — SODIUM CHLORIDE 0.9 % IV SOLN
1900.0000 mg | Freq: Once | INTRAVENOUS | Status: AC
  Administered 2022-11-21: 1900 mg via INTRAVENOUS
  Filled 2022-11-21: qty 10.62

## 2022-11-21 MED ORDER — SODIUM CHLORIDE 0.9 % IV SOLN
INTRAVENOUS | Status: AC
  Filled 2022-11-21 (×3): qty 200

## 2022-11-21 MED ORDER — SODIUM CHLORIDE 0.9 % IV SOLN
50.0000 mg/h | INTRAVENOUS | Status: DC
  Administered 2022-11-21 – 2022-11-23 (×7): 50 mg/h via INTRAVENOUS
  Filled 2022-11-21 (×9): qty 50

## 2022-11-21 MED ORDER — ACETAMINOPHEN 325 MG PO TABS: 650.0000 mg | ORAL_TABLET | ORAL | Status: DC | PRN

## 2022-11-21 MED ORDER — KETAMINE HCL-SODIUM CHLORIDE 1000-0.9 MG/100ML-% IV SOLN
5.0000 mg/kg/h | INTRAVENOUS | Status: DC
  Administered 2022-11-21 – 2022-11-22 (×2): 3 mg/kg/h via INTRAVENOUS
  Administered 2022-11-22: 5 mg/kg/h via INTRAVENOUS
  Administered 2022-11-22: 3 mg/kg/h via INTRAVENOUS
  Administered 2022-11-22: 4 mg/kg/h via INTRAVENOUS
  Administered 2022-11-22: 5 mg/kg/h via INTRAVENOUS
  Administered 2022-11-22 (×2): 4.5 mg/kg/h via INTRAVENOUS
  Administered 2022-11-22: 3 mg/kg/h via INTRAVENOUS
  Administered 2022-11-22: 4 mg/kg/h via INTRAVENOUS
  Administered 2022-11-23: 5 mg/kg/h via INTRAVENOUS
  Filled 2022-11-21 (×11): qty 100

## 2022-11-21 MED ORDER — VALPROATE SODIUM 100 MG/ML IV SOLN
750.0000 mg | Freq: Three times a day (TID) | INTRAVENOUS | Status: DC
  Administered 2022-11-21 – 2022-11-27 (×17): 750 mg via INTRAVENOUS
  Filled 2022-11-21 (×17): qty 7.5

## 2022-11-21 MED ORDER — HEPARIN SODIUM (PORCINE) 1000 UNIT/ML IJ SOLN
INTRAMUSCULAR | Status: AC
  Filled 2022-11-21: qty 4

## 2022-11-21 MED ORDER — SODIUM CHLORIDE 0.9 % IV SOLN
200.0000 mg | Freq: Two times a day (BID) | INTRAVENOUS | Status: DC
  Administered 2022-11-21 – 2022-11-23 (×5): 200 mg via INTRAVENOUS
  Filled 2022-11-21 (×6): qty 20

## 2022-11-21 MED ORDER — CALCIUM GLUCONATE-NACL 2-0.675 GM/100ML-% IV SOLN
2.0000 g | Freq: Once | INTRAVENOUS | Status: AC
  Administered 2022-11-21: 2000 mg via INTRAVENOUS
  Filled 2022-11-21: qty 100

## 2022-11-21 NOTE — Progress Notes (Signed)
Subjective: Intubated and sedated, began having concerning EEG pattern about 6am.   Exam: Vitals:   11/21/22 0800 11/21/22 0900  BP: (!) 140/62 (!) 150/132  Pulse: 69 (!) 55  Resp: 18 (!) 0  Temp: 97.7 F (36.5 C)   SpO2: 97% 98%   Gen: In bed, intubated Resp: ventilated   Neuro: PERRL, further neuro eval precluded by induced coma  Pertinent Labs: CSF RBC 643 CSF WBC 7 CSF protein 18 CSF glucose 80  Biofire meningitis/encephalitis panel-negative VZV PCR-pending HSV PCR-negative on bio fire panel, but separate test is pending CSF JC virus-pending VDRL-pending CSF CMV by PCR-negative CSF EBV-pending   CSF autoimmune encephalitis panel-pending CSF IgG index-pending   CRP-less than 0.2 ESR-2 ANA-negative ANCA - negative Thyroglobulin-pending Thyroid peroxidase - negative SSA/SSB-negative Serum autoimmune encephalitis panel-negative  RPR-negative HIV-negative Ammonia 15 TSH 1.5 Admission creatinine 1.17 UDS - negative  Creatinine is increasing   Impression: 22 year old male with fairly rapidly progressive encephalopathy and refractory seizures amounting to localized new onset refractory status epilepticus with multifocal areas of T2 signal change and a single area of restricted diffusion on MRI.  With relatively bland CSF, no fever or leukocytosis, I think that infectious etiology is very unlikely.  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  Despite being on relatively high doses of propofol and Versed, and achieving a good burst suppression throughout most of the night, he had breakthrough of a highly epileptiform pattern starting around 6 AM.  This could be due to tachyphylaxis to the Versed, but also could be worsening of his underlying condition.  Repeat head CT is stable, and I continue to suspect at this time that the most likely  culprit of this super refractory new onset status epilepticus to be autoimmune in nature given the lack of any infectious prodrome, and relatively bland CSF (I do not think a WBC of seven in a patient without clinical evidence of infection such as fever or leukocytosis to be indicative of infectious process).  Therefore I continue to suspect that the most likely culprit is autoimmune and would favor continued steroid and plasma exchange.  With the highly epileptiform discharges seen, I do not lightening sedation as planned for today would be at all likely to be successful, and I would favor continued suppression.  I will add phenobarbital which will hopefully allow [place] to reduce his propofol dose.  If unsuccessful, I would likely swap propofol for ketamine.  Recommendations: 1) continue Keppra at 1500 twice daily 2) continue Depacon 500 3 times daily, check level 3) lacosamide 200 mg twice daily 4) continue Solu-Medrol 1 g daily, day 4/5 5) plasmapheresis day 2/5 6) neurology will continue to follow  This patient is critically ill and at significant risk of neurological worsening, death and care requires constant monitoring of vital signs, hemodynamics,respiratory and cardiac monitoring, neurological assessment, discussion with family, other specialists and medical decision making of high complexity. I spent 35 minutes of neurocritical care time  in the care of  this patient. This was time spent independent of any time provided by nurse practitioner or PA.  Ritta Slot, MD [facility] [phone]  If 7pm- 7am, please page neurology on call as listed in AMION. 11/21/2022  9:48 AM

## 2022-11-21 NOTE — Progress Notes (Signed)
LTM maint complete - no skin breakdown under: Fp2 F8 A1 A2 Serviced several leads [facility] monitored, Event button test confirmed by [facility].

## 2022-11-21 NOTE — Progress Notes (Signed)
vLTM maintenance  All impedances below 10k.  No skin breakdown noted at P3  P4  A1  A2

## 2022-11-21 NOTE — Procedures (Signed)
atient Name: Colton Zhang  MRN: [id number]  Epilepsy Attending: Charlsie Quest  Referring Physician/Provider: Charlsie Quest, MD  Duration: 11/20/2022 1016 to 11/21/2022 1016   Patient history: 22 y.o. male with no significant PMH who presents with progressive confusion, lethargy, headache over the last week. MRI Brain notable for cortical edema within both hemispheres, mostly in right though. EEG to evaluate for seizure   Level of alertness: comatose   AEDs during EEG study: LEV, LCM, Propofol, Versed   Technical aspects: This EEG study was done with scalp electrodes positioned according to the 10-20 International system of electrode placement. Electrical activity was reviewed with band pass filter of 1-70Hz , sensitivity of 7 uV/mm, display speed of 60mm/sec with a 60Hz  notched filter applied as appropriate. EEG data were recorded continuously and digitally stored.  Video monitoring was not available.   Description: At the beginning of the study, EEG showed burst suppression pattern with bursts of 2 to 3 Hz delta slowing admixed with 12 to 13 Hz beta activity lasting 1.5 to 2 seconds alternating with 4 to 6 seconds of generalized EEG suppression.  After around 0330 on 6/4, the morphology of bursts worsened and showed generalized spikes admixed with 2 to 3 Hz delta slowing as well as 12 to 13 Hz beta activity.  Duration of the bursts also increased, appearing near continuous with 1 to 3 seconds of generalized suppression in between.  Around 0600 on 11/21/2022, EEG predominant showed generalized periodic discharges with overriding fast activity at 1 Hz admixed with sharply contoured polymorphic 3 to 7 Hz theta-delta slowing.  Brief 1 to 2 seconds of generalized EEG suppression was also noted. Hyperventilation and photic stimulation were not performed.      ABNORMALITY - Burst suppression, generalized - Generalized periodic discharges with overriding fast activity( GPD +F)   IMPRESSION: At the  beginning of the study, EEG was suggestive of profound diffuse encephalopathy, most likely secondary to sedation. Gradually after around 0400 on 11/21/2022, EEG showed evidence of epileptogenicity with generalized onset. This EEG pattern is on the ictal-interictal continuum with increased risk of seizure recurrence.   EEG is worsening compared to previous day.   Colton Zhang [name]

## 2022-11-21 NOTE — Progress Notes (Signed)
   11/21/22 1142  Vitals  Temp 97.6 F (36.4 C)  Temp Source Axillary  BP (!) 165/84  Apheresis   Procedure Comments Tx done.  Post-apheresis  Net Removed (mL) 3845 mL  Replacement (mL) 3000 mL  ACDA infused (mL) 590 mL  Total Normal Saline Administered 0 mL  Total Calcium Administered 100 mL (2g)  Tolerated Procedure Yes  Post-Procedure Comments Tx completed without issue.  Hemodialysis Catheter Right Internal jugular Triple lumen Temporary (Non-Tunneled)  Placement Date/Time: 11/19/22 1830   Time Out: Correct patient;Correct procedure;Correct site  Maximum sterile barrier precautions: Hand hygiene;Cap;Mask;Sterile gown;Sterile gloves;Large sterile sheet  Site Prep: Chlorhexidine (preferred)  Local Anes...  Site Condition No complications  Blue Lumen Status Heparin locked  Red Lumen Status Heparin locked  Catheter fill solution Heparin 1000 units/ml  Catheter fill volume (Arterial) 1.2 cc  Catheter fill volume (Venous) 1.2  Dressing Type Transparent  Dressing Status Antimicrobial disc in place;Clean, Dry, Intact  Interventions New dressing  Drainage Description None  Post treatment catheter status Capped and Clamped

## 2022-11-21 NOTE — Progress Notes (Signed)
Pt was transported to CT scan and back without complications.  

## 2022-11-21 NOTE — Progress Notes (Signed)
NAME:  Colton Zhang, MRN:  [id number], DOB:  11-15-2000, LOS: 3 ADMISSION DATE:  11/17/2022 CONSULTATION DATE:  11/19/2022 REFERRING MD:  [name] - Neuro CHIEF COMPLAINT:  AMS, hypertension  History of Present Illness:  22 year old man who presented to [facility] 6/23 with HA and blurry vision. PMHx significant for asthma, ADD.   Recently presented to urgent care 6/21 for R-sided headache, blurry vision and nausea x 4 days. HA improved with Tylenol. Denied photophobia. Recently seen by eye doctor 2-3 weeks prior and was prescribed contacts. Also reports impotence x 3-4 days. Hypertensive to 190/104 at [facility], sent home with plan for PCP follow up for hypertension. Presented to [facility] 6/23 with persistent HA, blurry vision and confusion for ~ 1 week with new onset of lightheadedness, SOB and CP. Also reported new onset of R arm weakness/paresthesias. Had acute onset of speech disturbance and gait changes 1 hour prior to presentation at [facility]. Hypertensive to 174/110. Neuro consulted and advised patient to present to ED for further workup.  Patient arrived to [facility] ED 6/23 and at that point had continued speech disturbance and blurry vision/unsteady gait. On ED arrival, hypertensive to 171/117, low grade temp 99.42F, HR 97, mildly disoriented. Labs were grossly unremarkable. CT Head 6/23 NAICA. MRI Brain 6/23 with multifocal cortical edema within both hemispheres, R > L, concerning for viral encephalitis. LP was completed in ED per Neuro recommendations and patient was started on empiric meningitis coverage (ceftriaxone, vanc, acyclovir). Meningitis/encephalitis panel including HSV 1/2 PCR, EBV, CMV and VDRL ordered as well as EEG. Patient admitted to [facility] service. CSF not consistent with infection, empiric steroids started for likely autoimmune/inflammatory process with EEG +seizure activity, Ativan and Keppra administered and LTM EEG started. ID was consulted with recommendation for discontinuation of antibiotics.  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 PCCM consulted for transfer to ICU, possible intubation for burst suppression to control status epilepticus and CVC placement for PLEX/IVIG.  Pertinent Medical History:   Past Medical History:  Diagnosis Date   ADD (attention deficit disorder)    Asthma    Significant Hospital Events: Including procedures, antibiotic start and stop dates in addition to other pertinent events   6/23 - Presented to ED from [facility] for persistent HA, blurry vision, hypertension, R-sided arm weakness/paresthesias. CT Head NAICA. MRI Brain with multifocal cortical edema within both hemispheres, R > L, concerning for viral encephalitis. Empiric meningitis coverage (CTX, vanc, acyclovir). LP not consistent with infection. 6/24 - LTM EEG with seizure activity. ID consulted, signed off after unlikely infectious etiology. 6/25 - PCCM consulted for ICU transfer, intubation for burst suppression, Trialysis catheter placement for PLEX/IVIG. 6/26 - LTM EEG/Prop for burst suppression, PLEX day 1  Interim History / Subjective:  In burst suppression overnight but now with break through seizures on cEEG on versed 15, fentanyl 100, propofol 80> Neuro loading with phenobarbital and pending repeat CTH Intermittently on low NE Afebrile   Objective:  Blood pressure (!) 140/62, pulse 69, temperature 97.7 F (36.5 C), temperature source Axillary, resp. rate 18, height 5\' 11"  (1.803 m), weight 95.3 kg, SpO2 97 %.    Vent Mode: PRVC FiO2 (%):  [40 %] 40 % Set Rate:  [18 bmp] 18 bmp Vt Set:  [600 mL] 600 mL PEEP:  [5 cmH20] 5 cmH20 Plateau Pressure:  [17 cmH20-18 cmH20] 17 cmH20   Intake/Output Summary (Last 24 hours) at 11/21/2022 0835 Last data filed at 11/21/2022 0600 Gross per 24  hour  Intake 7380.1 ml  Output 3375 ml  Net 4005.1 ml   Filed Weights   11/17/22 2310 11/20/22 1150 11/21/22 0357  Weight: 90.7 kg 90.7 kg 95.3  kg   Physical Examination: General:  critically ill young adult male sedated on MV HEENT: MM pink/moist, ETT/ OGT, pupils 3/sluggish Neuro: sedated, RASS -5 CV: rr, NSR, no murmur PULM:  MV supported, no cough, clear, diminished in bases, no secretions GI: soft, bs+, ND, foley- cyu Extremities: warm/dry, no LE edema  Skin: no rashes  Afebrile Labs reviewed> K 5.2, sCr 1.53> 1.28, Mag 2.9, WBC 11.6 UOP 3.5L/ 24hrs  Net +4.5L   Resolved Hospital Problem List:    Assessment & Plan:  Acute, rapidly progressive encephalopathy, concerning for autoimmune encephalitis, viral less likely Status epilepticus - Empiric meningitis coverage discontinued 6/24 P:  - per Neurology following, appreciate assistance - pending EBV CSF and culture pending (ngtd).  - was in burst suppression overnight, but now with electrographic seizures on cEEG.  Neuro loading with phenobarbital.  Hopeful to wean propofol once back in burst suppression - repeat stat CTH now - continue AEDs per Neuro> keppra, depacon, lacosamide, adding phenobarb today - PLEX per neuro, 1/5 completed 6/26 - cont solumedrol 1gm daily, day 4/5 - cont LTM EEG  Acute hypoxemic respiratory failure in the setting of status epilepticus R/o aspiration pneumonia  - Continue full vent support (4-8cc/kg IBW) - Wean FiO2 for O2 sat > 90% - hold SBT/ WUA till cleared by Neuro - VAP bundle - Pulmonary hygiene - PAD protocol for sedation: Propofol, Fentanyl, and Versed for goal RASS -5 (burst suppression) - cont ceftriaxone day 2/5 for possible aspiration pna  Hypotension, related to sedation  Febrile, concern for aspiration PNA - Goal MAP > 65 - cont levophed prn, may be more bradycardic w/ adding phenobarb> prn dobutamine if needed  - KVO MIVF> TF at goal, net 4L + - Trend WBC, fever curve - F/u Cx data - ceftriaxone as above   Hypertension Newly hypertensive on presentation to [facility] 6/21 and 6/23 (SBP 170s-190s). - intermittently  on pressors as above - Hydralazine/labetalol IV PRN  History of ADD - Not on home medications  Best Practice: (right click and "Reselect all SmartList Selections" daily)   Diet/type: NPO; TF  DVT prophylaxis: SCDs, Lovenox GI prophylaxis: PPI Lines: Dialysis Catheter + pigtail for PLEX Foley:  Yes, and it is still needed Code Status:  full code Last date of multidisciplinary goals of care discussion [Pending]  Pending 6/27.    Critical care time:    The patient is critically ill with multiple organ system failure and requires high complexity decision making for assessment and support, frequent evaluation and titration of therapies, advanced monitoring, review of radiographic studies and interpretation of complex data.   Critical Care Time devoted to patient care services, exclusive of separately billable procedures, described in this note is 36 minutes.  Posey Boyer, NP [facility] 11/21/22 8:35 AM  Please see [email or web address] for pager details.  From 7A-7P if no response, please call [phone] After hours, please call ELink [phone]

## 2022-11-21 NOTE — Progress Notes (Signed)
Patient has had continued difficulty with maintaining adequate burst suppression with fairly significant doses of propofol and Versed.  I would favor not having him on very high doses of propofol for prolonged periods of time, and given I suspect we are going to have prolonged need for sedative medications, I would favor trying to change to Versed/ketamine.  I will start ketamine, and then try to wean propofol as tolerated.  Ritta Slot, MD [facility] [phone]  If 7pm- 7am, please page neurology on call as listed in AMION.

## 2022-11-22 DIAGNOSIS — T17908A Unspecified foreign body in respiratory tract, part unspecified causing other injury, initial encounter: Secondary | ICD-10-CM

## 2022-11-22 DIAGNOSIS — G9341 Metabolic encephalopathy: Secondary | ICD-10-CM | POA: Diagnosis not present

## 2022-11-22 DIAGNOSIS — G0490 Encephalitis and encephalomyelitis, unspecified: Secondary | ICD-10-CM | POA: Diagnosis not present

## 2022-11-22 DIAGNOSIS — A86 Unspecified viral encephalitis: Secondary | ICD-10-CM | POA: Diagnosis not present

## 2022-11-22 LAB — PHOSPHORUS: Phosphorus: 4.1 mg/dL (ref 2.5–4.6)

## 2022-11-22 LAB — GLUCOSE, CAPILLARY
Glucose-Capillary: 125 mg/dL — ABNORMAL HIGH (ref 70–99)
Glucose-Capillary: 141 mg/dL — ABNORMAL HIGH (ref 70–99)
Glucose-Capillary: 155 mg/dL — ABNORMAL HIGH (ref 70–99)
Glucose-Capillary: 161 mg/dL — ABNORMAL HIGH (ref 70–99)
Glucose-Capillary: 169 mg/dL — ABNORMAL HIGH (ref 70–99)
Glucose-Capillary: 188 mg/dL — ABNORMAL HIGH (ref 70–99)

## 2022-11-22 LAB — CBC
HCT: 41.5 % (ref 39.0–52.0)
Hemoglobin: 13.5 g/dL (ref 13.0–17.0)
MCH: 26.3 pg (ref 26.0–34.0)
MCHC: 32.5 g/dL (ref 30.0–36.0)
MCV: 80.7 fL (ref 80.0–100.0)
Platelets: 284 10*3/uL (ref 150–400)
RBC: 5.14 MIL/uL (ref 4.22–5.81)
RDW: 12.5 % (ref 11.5–15.5)
WBC: 12.9 10*3/uL — ABNORMAL HIGH (ref 4.0–10.5)
nRBC: 0 % (ref 0.0–0.2)

## 2022-11-22 LAB — BASIC METABOLIC PANEL
Anion gap: 11 (ref 5–15)
BUN: 31 mg/dL — ABNORMAL HIGH (ref 6–20)
CO2: 26 mmol/L (ref 22–32)
Calcium: 8.6 mg/dL — ABNORMAL LOW (ref 8.9–10.3)
Chloride: 113 mmol/L — ABNORMAL HIGH (ref 98–111)
Creatinine, Ser: 1.21 mg/dL (ref 0.61–1.24)
GFR, Estimated: >60 mL/min (ref >60)
Glucose, Bld: 163 mg/dL — ABNORMAL HIGH (ref 70–99)
Potassium: 4.9 mmol/L (ref 3.5–5.1)
Sodium: 150 mmol/L — ABNORMAL HIGH (ref 135–145)

## 2022-11-22 LAB — MYCOPLASMA PNEUMONIAE ANTIBODY, IGM: Mycoplasma pneumo IgM: <770 U/mL (ref 0–769)

## 2022-11-22 LAB — IGG CSF INDEX
Albumin CSF-mCnc: 8 mg/dL — ABNORMAL LOW (ref 10–45)
Albumin: 4.4 g/dL (ref 4.3–5.2)
CSF IgG Index: 0.7 (ref 0.0–0.7)
IgG (Immunoglobin G), Serum: 1271 mg/dL (ref 603–1613)
IgG, CSF: 1.7 mg/dL (ref 0.0–10.3)
IgG/Alb Ratio, CSF: 0.21 (ref 0.00–0.25)

## 2022-11-22 LAB — PHENOBARBITAL LEVEL: Phenobarbital: 26.3 ug/mL (ref 15.0–40.0)

## 2022-11-22 LAB — MAGNESIUM: Magnesium: 3 mg/dL — ABNORMAL HIGH (ref 1.7–2.4)

## 2022-11-22 LAB — OLIGOCLONAL BANDS, CSF + SERM

## 2022-11-22 LAB — VALPROIC ACID LEVEL: Valproic Acid Lvl: 81 ug/mL (ref 50.0–100.0)

## 2022-11-22 MED ORDER — CHLORHEXIDINE GLUCONATE CLOTH 2 % EX PADS
6.0000 | MEDICATED_PAD | Freq: Every day | CUTANEOUS | Status: DC
  Administered 2022-11-22 – 2022-11-27 (×5): 6 via TOPICAL

## 2022-11-22 MED ORDER — CHLORHEXIDINE GLUCONATE CLOTH 2 % EX PADS: 6.0000 | MEDICATED_PAD | Freq: Every day | CUTANEOUS | Status: DC

## 2022-11-22 MED ORDER — SODIUM CHLORIDE 0.9% FLUSH
10.0000 mL | Freq: Two times a day (BID) | INTRAVENOUS | Status: DC
  Administered 2022-11-22 – 2022-11-23 (×3): 20 mL
  Administered 2022-11-23: 30 mL
  Administered 2022-11-24 – 2022-11-25 (×3): 10 mL
  Administered 2022-11-26 – 2022-11-27 (×3): 20 mL
  Administered 2022-11-27: 10 mL

## 2022-11-22 MED ORDER — MIDAZOLAM BOLUS VIA INFUSION
10.0000 mg | Freq: Once | INTRAVENOUS | Status: AC
  Administered 2022-11-22: 10 mg via INTRAVENOUS

## 2022-11-22 MED ORDER — SODIUM CHLORIDE 0.9% FLUSH: 10.0000 mL | INTRAVENOUS | Status: DC | PRN

## 2022-11-22 NOTE — Procedures (Addendum)
Patient Name: Colton Zhang  MRN: [id number]  Epilepsy Attending: Charlsie Quest  Referring Physician/Provider: Charlsie Quest, MD  Duration: 11/21/2022 1016 to 11/22/2022 1016   Patient history: 22 y.o. male with no significant PMH who presents with progressive confusion, lethargy, headache over the last week. MRI Brain notable for cortical edema within both hemispheres, mostly in right though. EEG to evaluate for seizure   Level of alertness: comatose   AEDs during EEG study: LEV, LCM, Propofol, Versed   Technical aspects: This EEG study was done with scalp electrodes positioned according to the 10-20 International system of electrode placement. Electrical activity was reviewed with band pass filter of 1-70Hz , sensitivity of 7 uV/mm, display speed of 6mm/sec with a 60Hz  notched filter applied as appropriate. EEG data were recorded continuously and digitally stored.  Video monitoring was not available.   Description:  EEG showed burst suppression pattern with bursts of 3-6hz  theta-delta slowing lasting 2-4seconds alternating with 10-15 seconds of generalized EEG suppression. Frequent generalized spikes were noted at times. Hyperventilation and photic stimulation were not performed.      ABNORMALITY - Burst suppression, generalized - Spike, generalized   IMPRESSION: This study showed evidence of epileptogenicity with generalized onset and increased risk of seizure recurrence. Additionally there is profound diffuse encephalopathy, most likely secondary to sedation. No definite seizures were noted.  Fiora Weill [name]

## 2022-11-22 NOTE — Progress Notes (Signed)
Subjective: Intubated and sedated, began having concerning EEG pattern about 6am.   Exam: Vitals:   11/22/22 1830 11/22/22 1912  BP: (!) 103/53   Pulse: 76 79  Resp: 18 18  Temp:    SpO2: 95% 96%   Gen: In bed, intubated Resp: ventilated   Neuro: PERRL, further neuro eval precluded by induced coma  Pertinent Labs: CSF RBC 643 CSF WBC 7 CSF protein 18 CSF glucose 80  Biofire meningitis/encephalitis panel-negative  CSF CMV by PCR-negative CSF EBV-negative  CSF autoimmune encephalitis panel-pending CSF IgG index-negative CSF OC bands - negative   CRP-less than 0.2 ESR-2 ANA-negative ANCA - negative Thyroglobulin-pending Thyroid peroxidase - negative SSA/SSB-negative Serum autoimmune encephalitis panel-negative  RPR-negative HIV-negative Ammonia 15 TSH 1.5 Admission creatinine 1.17 UDS - negative  Impression: 22 year old male with fairly rapidly progressive encephalopathy and refractory seizures amounting to localized new onset refractory status epilepticus with multifocal areas of T2 signal change and a single area of restricted diffusion on MRI.  With relatively bland CSF, no fever or leukocytosis, I think that infectious etiology is very unlikely.  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  Repeat head CT is stable, and I continue to suspect at this time that the most likely culprit of this super refractory new onset status epilepticus to be autoimmune in nature given the lack of any infectious prodrome, and relatively bland CSF (I do not think a WBC of seven in a patient without clinical evidence of infection such as fever or leukocytosis to be indicative of infectious process).  Therefore I continue to suspect that the most likely culprit is autoimmune and would favor continued steroid and plasma exchange.  Will plan to try lightening  tomorrow, but with discharges in bursts, have high concern that he will continue   Recommendations: 1) continue Keppra at 1500 twice daily 2) continue Depacon 500 3 times daily, check level 3) lacosamide 200 mg twice daily 4) continue Solu-Medrol 1 g daily, day 4/5 5) plasmapheresis day 2/5 6) continue burst suppression with ketamine, propofol and versed 7) neurology will continue to follow  This patient is critically ill and at significant risk of neurological worsening, death and care requires constant monitoring of vital signs, hemodynamics,respiratory and cardiac monitoring, neurological assessment, discussion with family, other specialists and medical decision making of high complexity. I spent 45 minutes of neurocritical care time  in the care of  this patient. This was time spent independent of any time provided by nurse practitioner or PA.  Ritta Slot, MD [facility] [phone]  If 7pm- 7am, please page neurology on call as listed in AMION. 11/22/2022  7:40 PM

## 2022-11-22 NOTE — Progress Notes (Signed)
LTM maint complete - no skin breakdown seen.  Re-applied P8 and ground. [facility] monitored.

## 2022-11-22 NOTE — Progress Notes (Signed)
NAME:  Colton Zhang, MRN:  [id number], DOB:  02/05/2001, LOS: 4 ADMISSION DATE:  11/17/2022 CONSULTATION DATE:  11/19/2022 REFERRING MD:  [name] - Neuro CHIEF COMPLAINT:  AMS, hypertension  History of Present Illness:  22 year old man who presented to [facility] 6/23 with HA and blurry vision. PMHx significant for asthma, ADD.   Recently presented to urgent care 6/21 for R-sided headache, blurry vision and nausea x 4 days. HA improved with Tylenol. Denied photophobia. Recently seen by eye doctor 2-3 weeks prior and was prescribed contacts. Also reports impotence x 3-4 days. Hypertensive to 190/104 at [facility], sent home with plan for PCP follow up for hypertension. Presented to [facility] 6/23 with persistent HA, blurry vision and confusion for ~ 1 week with new onset of lightheadedness, SOB and CP. Also reported new onset of R arm weakness/paresthesias. Had acute onset of speech disturbance and gait changes 1 hour prior to presentation at [facility]. Hypertensive to 174/110. Neuro consulted and advised patient to present to ED for further workup.  Patient arrived to [facility] ED 6/23 and at that point had continued speech disturbance and blurry vision/unsteady gait. On ED arrival, hypertensive to 171/117, low grade temp 99.44F, HR 97, mildly disoriented. Labs were grossly unremarkable. CT Head 6/23 NAICA. MRI Brain 6/23 with multifocal cortical edema within both hemispheres, R > L, concerning for viral encephalitis. LP was completed in ED per Neuro recommendations and patient was started on empiric meningitis coverage (ceftriaxone, vanc, acyclovir). Meningitis/encephalitis panel including HSV 1/2 PCR, EBV, CMV and VDRL ordered as well as EEG. Patient admitted to [facility] service. CSF not consistent with infection, empiric steroids started for likely autoimmune/inflammatory process with EEG +seizure activity, Ativan and Keppra administered and LTM EEG started. ID was consulted with recommendation for discontinuation of antibiotics.  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 PCCM consulted for transfer to ICU, possible intubation for burst suppression to control status epilepticus and CVC placement for PLEX/IVIG.  Pertinent Medical History:   Past Medical History:  Diagnosis Date   ADD (attention deficit disorder)    Asthma    Significant Hospital Events: Including procedures, antibiotic start and stop dates in addition to other pertinent events   6/23 - Presented to ED from [facility] for persistent HA, blurry vision, hypertension, R-sided arm weakness/paresthesias. CT Head NAICA. MRI Brain with multifocal cortical edema within both hemispheres, R > L, concerning for viral encephalitis. Empiric meningitis coverage (CTX, vanc, acyclovir). LP not consistent with infection. 6/24 - LTM EEG with seizure activity. ID consulted, signed off after unlikely infectious etiology. 6/25 - PCCM consulted for ICU transfer, intubation for burst suppression, Trialysis catheter placement for PLEX/IVIG. 6/26 - LTM EEG/Prop for burst suppression, PLEX day 1 6/28 PICC Placed  Interim History / Subjective:  Ongoing burst suppression, no electrographic seizures noted overnight. On 50 Versed, 40 Propofol, 100 Fentanyl   Objective:  Blood pressure 119/65, pulse 76, temperature (!) 97.5 F (36.4 C), temperature source Oral, resp. rate 18, height 5\' 11"  (1.803 m), weight 93.7 kg, SpO2 96 %.    Vent Mode: PRVC FiO2 (%):  [40 %] 40 % Set Rate:  [18 bmp] 18 bmp Vt Set:  [600 mL] 600 mL PEEP:  [5 cmH20] 5 cmH20 Plateau Pressure:  [17 cmH20-18 cmH20] 18 cmH20   Intake/Output Summary (Last 24 hours) at 11/22/2022 0912 Last data filed at 11/22/2022 0800 Gross per 24 hour  Intake 6688.83 ml  Output 5635 ml  Net 1053.83 ml  Filed Weights   11/21/22 0357 11/21/22 1015 11/22/22 0430  Weight: 95.3 kg 95.3 kg 93.7 kg   Physical Examination: General:  critically ill young adult male sedated  on MV HEENT: MM pink/moist, ETT/ OGT, pupils 3/sluggish Neuro: sedated, RASS -5 CV: RRR, no M/R/G PULM:  Normal effort. CTAB GI: BS x 4, S/NT/ND Extremities: warm/dry, no LE edema  Skin: no rashes    Assessment & Plan:   Acute, rapidly progressive encephalopathy, concerning for autoimmune encephalitis, viral less likely Status epilepticus - Empiric meningitis coverage discontinued 6/24 P:  - Neurology following, appreciate assistance - pending EBV CSF and culture pending (ngtd) - Continue burst suppression with Propofol and Midazolam - continue AEDs per Neuro> keppra, depacon, lacosamide, added phenobarb 6/27 - PLEX per neuro, 2/5 completed 6/27 - cont solumedrol 1gm daily through today, day 5/5 - cont LTM EEG  Acute hypoxemic respiratory failure in the setting of status epilepticus Possible aspiration pneumonia  - Continue full vent support (4-8cc/kg IBW) - Wean FiO2 for O2 sat > 90% - hold SBT/ WUA till cleared by Neuro - VAP bundle - Pulmonary hygiene - PAD protocol for sedation: Propofol, Fentanyl, and Versed for goal RASS -5 (burst suppression) - cont ceftriaxone day 3/5 for possible aspiration pna  Hypotension and Bradycardia, related to sedation  - Continue low dose Dopamine for now - KVO MIVF> TF at goal, net 4L + - Trend WBC, fever curve  Hypertension Newly hypertensive on presentation to [facility] 6/21 and 6/23 (SBP 170s-190s). - Hydralazine/labetalol IV PRN  History of ADD - Not on home medications  Best Practice: (right click and "Reselect all SmartList Selections" daily)   Diet/type: NPO; TF  DVT prophylaxis: SCDs, Lovenox GI prophylaxis: PPI Lines: Dialysis Catheter + pigtail for PLEX, PICC placed 6/28 Foley:  Yes, and it is still needed Code Status:  full code Last date of multidisciplinary goals of care discussion [Pending]  Critical care time: 35 min.    Rutherford Guys, PA - [facility] For pager details, please see  AMION or use Epic chat  After 1900, please call [facility] for cross coverage needs 11/22/2022, 9:29 AM

## 2022-11-22 NOTE — Progress Notes (Signed)
Peripherally Inserted Central Catheter Placement  The IV Nurse has discussed with the patient and/or persons authorized to consent for the patient, the purpose of this procedure and the potential benefits and risks involved with this procedure.  The benefits include less needle sticks, lab draws from the catheter, and the patient may be discharged home with the catheter. Risks include, but not limited to, infection, bleeding, blood clot (thrombus formation), and puncture of an artery; nerve damage and irregular heartbeat and possibility to perform a PICC exchange if needed/ordered by physician.  Alternatives to this procedure were also discussed.  Bard Power PICC patient education guide, fact sheet on infection prevention and patient information card has been provided to patient /or left at bedside.    PICC Placement Documentation  PICC Triple Lumen 11/22/22 Right Brachial 41 cm 2 cm (Active)  Indication for Insertion or Continuance of Line Vasoactive infusions 11/22/22 0900  Exposed Catheter (cm) 2 cm 11/22/22 0900  Site Assessment Clean, Dry, Intact 11/22/22 0900  Lumen #1 Status Flushed;Saline locked;Blood return noted 11/22/22 0900  Lumen #2 Status Flushed;Saline locked;Blood return noted 11/22/22 0900  Lumen #3 Status Flushed;Saline locked;Blood return noted 11/22/22 0900  Dressing Type Transparent;Securing device 11/22/22 0900  Dressing Status Antimicrobial disc in place;Clean, Dry, Intact 11/22/22 0900  Safety Lock Not Applicable 11/22/22 0900  Line Care Connections checked and tightened 11/22/22 0900  Dressing Intervention New dressing 11/22/22 0900  Dressing Change Due 11/29/22 11/22/22 0900       Curt Jews 11/22/2022, 9:25 AM

## 2022-11-22 NOTE — Progress Notes (Signed)
LTM EEG reviewed from 0014 to 0223. Continued stable burst suppression pattern with no electrographic seizures seen.   Electronically signed: Dr. Caryl Pina

## 2022-11-22 NOTE — Progress Notes (Addendum)
LTM maint complete - no skin breakdown under: Fp2 F4 F8 A1 Service Fz P7 [facility] monitored, Event button test confirmed by [facility].

## 2022-11-23 ENCOUNTER — Inpatient Hospital Stay (HOSPITAL_COMMUNITY): Payer: BC Managed Care – PPO

## 2022-11-23 DIAGNOSIS — G0490 Encephalitis and encephalomyelitis, unspecified: Secondary | ICD-10-CM | POA: Diagnosis not present

## 2022-11-23 DIAGNOSIS — A86 Unspecified viral encephalitis: Secondary | ICD-10-CM | POA: Diagnosis not present

## 2022-11-23 LAB — BASIC METABOLIC PANEL
Anion gap: 5 (ref 5–15)
BUN: 37 mg/dL — ABNORMAL HIGH (ref 6–20)
CO2: 27 mmol/L (ref 22–32)
Calcium: 7.6 mg/dL — ABNORMAL LOW (ref 8.9–10.3)
Chloride: 119 mmol/L — ABNORMAL HIGH (ref 98–111)
Creatinine, Ser: 1.26 mg/dL — ABNORMAL HIGH (ref 0.61–1.24)
GFR, Estimated: >60 mL/min (ref >60)
Glucose, Bld: 183 mg/dL — ABNORMAL HIGH (ref 70–99)
Potassium: 4.5 mmol/L (ref 3.5–5.1)
Sodium: 151 mmol/L — ABNORMAL HIGH (ref 135–145)

## 2022-11-23 LAB — CBC
HCT: 38.4 % — ABNORMAL LOW (ref 39.0–52.0)
Hemoglobin: 12.3 g/dL — ABNORMAL LOW (ref 13.0–17.0)
MCH: 27.2 pg (ref 26.0–34.0)
MCHC: 32 g/dL (ref 30.0–36.0)
MCV: 85 fL (ref 80.0–100.0)
Platelets: 241 10*3/uL (ref 150–400)
RBC: 4.52 MIL/uL (ref 4.22–5.81)
RDW: 13.2 % (ref 11.5–15.5)
WBC: 14.1 10*3/uL — ABNORMAL HIGH (ref 4.0–10.5)
nRBC: 0 % (ref 0.0–0.2)

## 2022-11-23 LAB — PHENOBARBITAL LEVEL: Phenobarbital: 25.4 ug/mL (ref 15.0–40.0)

## 2022-11-23 LAB — PROCALCITONIN: Procalcitonin: <0.1 ng/mL

## 2022-11-23 LAB — GLUCOSE, CAPILLARY
Glucose-Capillary: 138 mg/dL — ABNORMAL HIGH (ref 70–99)
Glucose-Capillary: 140 mg/dL — ABNORMAL HIGH (ref 70–99)
Glucose-Capillary: 141 mg/dL — ABNORMAL HIGH (ref 70–99)
Glucose-Capillary: 163 mg/dL — ABNORMAL HIGH (ref 70–99)
Glucose-Capillary: 164 mg/dL — ABNORMAL HIGH (ref 70–99)
Glucose-Capillary: 176 mg/dL — ABNORMAL HIGH (ref 70–99)

## 2022-11-23 LAB — VALPROIC ACID LEVEL: Valproic Acid Lvl: 59 ug/mL (ref 50.0–100.0)

## 2022-11-23 LAB — TRIGLYCERIDES: Triglycerides: 118 mg/dL (ref <150)

## 2022-11-23 LAB — PHOSPHORUS: Phosphorus: 2.7 mg/dL (ref 2.5–4.6)

## 2022-11-23 LAB — MAGNESIUM: Magnesium: 2.9 mg/dL — ABNORMAL HIGH (ref 1.7–2.4)

## 2022-11-23 LAB — CULTURE, BLOOD (ROUTINE X 2)

## 2022-11-23 MED ORDER — FUROSEMIDE 10 MG/ML IJ SOLN
40.0000 mg | Freq: Four times a day (QID) | INTRAMUSCULAR | Status: AC
  Administered 2022-11-23 (×2): 40 mg via INTRAVENOUS
  Filled 2022-11-23 (×2): qty 4

## 2022-11-23 MED ORDER — ALBUMIN HUMAN 25 % IV SOLN
Freq: Once | INTRAVENOUS | Status: DC
Start: 2022-11-23 — End: 2022-11-23

## 2022-11-23 MED ORDER — METOCLOPRAMIDE HCL 5 MG/ML IJ SOLN
5.0000 mg | Freq: Three times a day (TID) | INTRAMUSCULAR | Status: AC
  Administered 2022-11-23 (×2): 5 mg via INTRAVENOUS
  Filled 2022-11-23 (×2): qty 2

## 2022-11-23 MED ORDER — PENTOBARBITAL LOAD VIA INFUSION
10.0000 mg/kg | Freq: Once | INTRAVENOUS | Status: AC
  Administered 2022-11-23: 960 mg via INTRAVENOUS

## 2022-11-23 MED ORDER — CALCIUM GLUCONATE-NACL 2-0.675 GM/100ML-% IV SOLN
2.0000 g | Freq: Once | INTRAVENOUS | Status: AC
  Administered 2022-11-23: 2000 mg via INTRAVENOUS
  Filled 2022-11-23: qty 100

## 2022-11-23 MED ORDER — CALCIUM GLUCONATE-NACL 2-0.675 GM/100ML-% IV SOLN
INTRAVENOUS | Status: AC
  Administered 2022-11-23: 2000 mg
  Filled 2022-11-23: qty 100

## 2022-11-23 MED ORDER — HEPARIN SODIUM (PORCINE) 1000 UNIT/ML IJ SOLN: 1000.0000 [IU] | Freq: Once | INTRAMUSCULAR | Status: AC

## 2022-11-23 MED ORDER — CALCIUM CARBONATE ANTACID 500 MG PO CHEW
2.0000 | CHEWABLE_TABLET | ORAL | Status: AC
  Administered 2022-11-23: 400 mg via ORAL
  Filled 2022-11-23: qty 2

## 2022-11-23 MED ORDER — PERAMPANEL 8 MG PO TABS
16.0000 mg | ORAL_TABLET | Freq: Once | ORAL | Status: AC
  Administered 2022-11-23: 16 mg via ORAL
  Filled 2022-11-23: qty 2

## 2022-11-23 MED ORDER — ACETAMINOPHEN 325 MG PO TABS: 650.0000 mg | ORAL_TABLET | ORAL | Status: DC | PRN

## 2022-11-23 MED ORDER — PERAMPANEL 8 MG PO TABS
8.0000 mg | ORAL_TABLET | Freq: Every day | ORAL | Status: DC
  Administered 2022-11-23: 8 mg via ORAL
  Filled 2022-11-23: qty 1

## 2022-11-23 MED ORDER — SODIUM CHLORIDE 0.9 % IV SOLN: INTRAVENOUS | Status: DC | PRN

## 2022-11-23 MED ORDER — SODIUM CHLORIDE 0.9 % IV SOLN
INTRAVENOUS | Status: AC
  Filled 2022-11-23 (×3): qty 200

## 2022-11-23 MED ORDER — METOLAZONE 5 MG PO TABS
5.0000 mg | ORAL_TABLET | Freq: Once | ORAL | Status: AC
  Administered 2022-11-23: 5 mg
  Filled 2022-11-23: qty 1

## 2022-11-23 MED ORDER — ACD FORMULA A 0.73-2.45-2.2 GM/100ML VI SOLN
1000.0000 mL | Status: DC
  Administered 2022-11-23: 1000 mL

## 2022-11-23 MED ORDER — DIPHENHYDRAMINE HCL 25 MG PO CAPS
25.0000 mg | ORAL_CAPSULE | Freq: Four times a day (QID) | ORAL | Status: DC | PRN
  Filled 2022-11-23: qty 1

## 2022-11-23 MED ORDER — PENTOBARBITAL SODIUM 50 MG/ML IJ SOLN
3.0000 mg/kg/h | INTRAVENOUS | Status: DC
  Administered 2022-11-23: 3 mg/kg/h via INTRAVENOUS
  Administered 2022-11-23: 2 mg/kg/h via INTRAVENOUS
  Administered 2022-11-24: 2.5 mg/kg/h via INTRAVENOUS
  Administered 2022-11-24: 3.25 mg/kg/h via INTRAVENOUS
  Administered 2022-11-24 – 2022-11-25 (×4): 4 mg/kg/h via INTRAVENOUS
  Administered 2022-11-26 – 2022-11-27 (×2): 2 mg/kg/h via INTRAVENOUS
  Administered 2022-11-27: 3 mg/kg/h via INTRAVENOUS
  Filled 2022-11-23 (×18): qty 50

## 2022-11-23 MED ORDER — FREE WATER
200.0000 mL | Freq: Four times a day (QID) | Status: DC
  Administered 2022-11-23 – 2022-11-24 (×4): 200 mL

## 2022-11-23 MED ORDER — KETAMINE HCL 10 MG/ML IJ SOLN
5.0000 mg/kg/h | Status: DC
  Administered 2022-11-23 (×3): 5 mg/kg/h via INTRAVENOUS
  Filled 2022-11-23 (×4): qty 200

## 2022-11-23 MED ORDER — HEPARIN SODIUM (PORCINE) 1000 UNIT/ML IJ SOLN
INTRAMUSCULAR | Status: AC
  Administered 2022-11-23: 1000 [IU]
  Filled 2022-11-23: qty 3

## 2022-11-23 MED ORDER — LACTULOSE 10 GM/15ML PO SOLN
20.0000 g | Freq: Three times a day (TID) | ORAL | Status: DC
  Administered 2022-11-23 (×3): 20 g
  Filled 2022-11-23 (×3): qty 30

## 2022-11-23 MED ORDER — CALCIUM GLUCONATE-NACL 2-0.675 GM/100ML-% IV SOLN
INTRAVENOUS | Status: AC
  Filled 2022-11-23: qty 100

## 2022-11-23 NOTE — Progress Notes (Signed)
LTM maint complete - skin breakdown under: FP1, no breakdown seen under Fp2 F7 Serviced Fp2 and Cz [facility] monitored, Event button test confirmed by [facility].

## 2022-11-23 NOTE — Progress Notes (Signed)
RT was unable to obtain enough sputum from the patient for sampling, will attempt again later. RN notified

## 2022-11-23 NOTE — Progress Notes (Signed)
NAME:  Colton Zhang, MRN:  [id number], DOB:  [birth year], LOS: 5 ADMISSION DATE:  11/17/2022 CONSULTATION DATE:  11/19/2022 REFERRING MD:  [name] - Neuro CHIEF COMPLAINT:  AMS, hypertension  History of Present Illness:  22 year old man who presented to [facility] 6/23 with HA and blurry vision. PMHx significant for asthma, ADD.   Recently presented to urgent care 6/21 for R-sided headache, blurry vision and nausea x 4 days. HA improved with Tylenol. Denied photophobia. Recently seen by eye doctor 2-3 weeks prior and was prescribed contacts. Also reports impotence x 3-4 days. Hypertensive to 190/104 at [facility], sent home with plan for PCP follow up for hypertension. Presented to [facility] 6/23 with persistent HA, blurry vision and confusion for ~ 1 week with new onset of lightheadedness, SOB and CP. Also reported new onset of R arm weakness/paresthesias. Had acute onset of speech disturbance and gait changes 1 hour prior to presentation at [facility]. Hypertensive to 174/110. Neuro consulted and advised patient to present to ED for further workup.  Patient arrived to [facility] ED 6/23 and at that point had continued speech disturbance and blurry vision/unsteady gait. On ED arrival, hypertensive to 171/117, low grade temp 99.45F, HR 97, mildly disoriented. Labs were grossly unremarkable. CT Head 6/23 NAICA. MRI Brain 6/23 with multifocal cortical edema within both hemispheres, R > L, concerning for viral encephalitis. LP was completed in ED per Neuro recommendations and patient was started on empiric meningitis coverage (ceftriaxone, vanc, acyclovir). Meningitis/encephalitis panel including HSV 1/2 PCR, EBV, CMV and VDRL ordered as well as EEG. Patient admitted to [facility] service. CSF not consistent with infection, empiric steroids started for likely autoimmune/inflammatory process with EEG +seizure activity, Ativan and Keppra administered and LTM EEG started. ID was consulted with recommendation for discontinuation of antibiotics.  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 PCCM consulted for transfer to ICU, possible intubation for burst suppression to control status epilepticus and CVC placement for PLEX/IVIG.  Pertinent Medical History:   Past Medical History:  Diagnosis Date   ADD (attention deficit disorder)    Asthma    Significant Hospital Events: Including procedures, antibiotic start and stop dates in addition to other pertinent events   6/23 - Presented to ED from [facility] for persistent HA, blurry vision, hypertension, R-sided arm weakness/paresthesias. CT Head NAICA. MRI Brain with multifocal cortical edema within both hemispheres, R > L, concerning for viral encephalitis. Empiric meningitis coverage (CTX, vanc, acyclovir). LP not consistent with infection. 6/24 - LTM EEG with seizure activity. ID consulted, signed off after unlikely infectious etiology. 6/25 - PCCM consulted for ICU transfer, intubation for burst suppression, Trialysis catheter placement for PLEX/IVIG. 6/26 - LTM EEG/Prop for burst suppression, PLEX day 1 6/28 PICC Placed  Interim History / Subjective:  Spiked fever overnight. Remains in burst suppression. ? Emesis x 1  Objective:  Blood pressure (!) 109/52, pulse 94, temperature 100.3 F (37.9 C), temperature source Oral, resp. rate 18, height 5\' 11"  (1.803 m), weight 95.6 kg, SpO2 94 %.    Vent Mode: PRVC FiO2 (%):  [40 %] 40 % Set Rate:  [18 bmp] 18 bmp Vt Set:  [600 mL] 600 mL PEEP:  [5 cmH20] 5 cmH20 Plateau Pressure:  [17 cmH20-18 cmH20] 17 cmH20   Intake/Output Summary (Last 24 hours) at 11/23/2022 0854 Last data filed at 11/23/2022 0800 Gross per 24 hour  Intake 5168.23 ml  Output 3485 ml  Net 1683.23 ml    [employer]  11/21/22 1015 11/22/22 0430 11/23/22 0500  Weight: 95.3 kg 93.7 kg 95.6 kg   Physical Examination: RASS -5 Pupils pinpoint Developing some ?angioedema Burst supression on EEG Abd soft,  hypoactive BS Lungs occasional rhonci, passive on vent Heart sounds regular  Sodium up Cr stable   Assessment & Plan:   Acute, rapidly progressive encephalopathy, concerning for autoimmune encephalitis, viral less likely Status epilepticus - Empiric meningitis coverage discontinued 6/24 P:  - PLEX and steroids per neurology - Burst suppression lightening per neurology  Acute hypoxemic respiratory failure in the setting of status epilepticus Possible aspiration pneumonia  Fevers (6/29) - Complete 5 days ceftriaxone - Continue vent bundle - Check Pct, tracheal aspirate, DC foley  Volume overloaded state of heart- worsened - Balanced diuretic trial  ?aspiration/TF intolerance- retry trickle feeds, strengthen bowel regimen, abd exam benign, give a couple doses of reglan  Hypotension and Bradycardia, related to sedation  - Continue low dose Dopamine PRN, can probably wean today - KVO MIVF> TF at goal, net 4L + - Trend WBC, fever curve  Hypertension Newly hypertensive on presentation to [facility] 6/21 and 6/23 (SBP 170s-190s). - Hydralazine/labetalol IV PRN  History of ADD - Not on home medications  Best Practice: (right click and "Reselect all SmartList Selections" daily)   Diet/type: NPO; TF  DVT prophylaxis: SCDs, Lovenox GI prophylaxis: PPI Lines: Dialysis Catheter + pigtail for PLEX, PICC placed 6/28 Foley:  Yes, and it is still needed Code Status:  full code Last date of multidisciplinary goals of care discussion [updated by neuro, suspect may need trach/PEG]  Critical care time: 32 min.    Myrla Halsted MD PCCM [facility] For pager details, please see AMION or use Epic chat  After 1900, please call [facility] for cross coverage needs 11/23/2022, 8:54 AM

## 2022-11-23 NOTE — Progress Notes (Signed)
eLink Physician-Brief Progress Note Patient Name: Colton Zhang DOB: Nov 20, 2000 MRN: [id number]   Date of Service  11/23/2022  HPI/Events of Note  Fever to 101  eICU Interventions  Blood cultures x 2     Intervention Category Intermediate Interventions: Other:  Henry Russel, Demetrius Charity 11/23/2022, 5:42 AM

## 2022-11-23 NOTE — Procedures (Addendum)
Patient Name: Colton Zhang  MRN: [id number]  Epilepsy Attending: Charlsie Quest  Referring Physician/Provider: Charlsie Quest, MD  Duration: 11/22/2022 1016 to 11/23/2022 1016   Patient history: 22 y.o. male with no significant PMH who presents with progressive confusion, lethargy, headache over the last week. MRI Brain notable for cortical edema within both hemispheres, mostly in right though. EEG to evaluate for seizure   Level of alertness: comatose   AEDs during EEG study: LEV, LCM, Propofol, Versed   Technical aspects: This EEG study was done with scalp electrodes positioned according to the 10-20 International system of electrode placement. Electrical activity was reviewed with band pass filter of 1-70Hz , sensitivity of 7 uV/mm, display speed of 88mm/sec with a 60Hz  notched filter applied as appropriate. EEG data were recorded continuously and digitally stored.  Video monitoring was not available.   Description:  EEG showed burst suppression pattern with bursts of 3-6hz  theta-delta slowing lasting 2-4seconds alternating with 2-5seconds of generalized EEG suppression. Frequent generalized spikes were noted at times. Gradually, spikes became less frequent and the duration of suppression increased to 7-13 seconds. After round 0900 on 11/23/2022, EEG appears near continuous with 3-7hz  theta-delta sowing admixed with generalized periodic spikes at 1hz . There was also 0.5-2 seconds generalized eeg suppression. Hyperventilation and photic stimulation were not performed.      ABNORMALITY - Burst suppression, generalized - Spike, generalized   IMPRESSION: This study showed evidence of epileptogenicity with generalized onset and increased risk of seizure recurrence. Additionally there is profound diffuse encephalopathy, most likely secondary to sedation. No definite seizures were noted.   Zetha Kuhar [name]

## 2022-11-23 NOTE — Progress Notes (Signed)
EEG maint complete.  ?

## 2022-11-23 NOTE — Progress Notes (Signed)
   11/23/22 1036  Vitals  Temp 98.1 F (36.7 C)  Temp Source Axillary  BP (!) 101/48  Apheresis   Procedure Comments Tx done.  Post-apheresis  Net Removed (mL) 3822 mL  Replacement (mL) 3044 mL  ACDA infused (mL) 647 mL  Total Normal Saline Administered 0 mL  Total Calcium Administered 200 mL (4g)  Tolerated Procedure Yes  Post-Procedure Comments Tx completed without issue.  Hemodialysis Catheter Right Internal jugular Triple lumen Temporary (Non-Tunneled)  Placement Date/Time: 11/19/22 1830   Time Out: Correct patient;Correct procedure;Correct site  Maximum sterile barrier precautions: Hand hygiene;Cap;Mask;Sterile gown;Sterile gloves;Large sterile sheet  Site Prep: Chlorhexidine (preferred)  Local Anes...  Site Condition No complications  Blue Lumen Status Heparin locked  Red Lumen Status Heparin locked  Catheter fill solution Heparin 1000 units/ml  Catheter fill volume (Arterial) 1.2 cc  Catheter fill volume (Venous) 1.2  Dressing Type Transparent  Dressing Status Antimicrobial disc in place  Interventions New dressing  Drainage Description None  Post treatment catheter status Capped and Clamped

## 2022-11-23 NOTE — Progress Notes (Signed)
With weaning sedation, the EEG has become more continuous with the pattern of generalized periodic discharges with a frequency of 2 Hz with overriding fast activity, and I do think there is some evidence of periods of brief evolution.  I suspect that this is an ictal pattern as opposed to an emergence from anesthesia pattern, I do not think we should continue weaning sedation.  I have ordered pentobarbital 10 mg/kg load followed by 2 mg/h.  Will wean other sedation.  I will add perampanel at this time as given its mechanism of action, it has been proposed to be useful in status epilepticus, and I do not think we got any benefit from lacosamide so I will stop this at this time.  Ritta Slot, MD [facility] [phone]  If 7pm- 7am, please page neurology on call as listed in AMION.

## 2022-11-23 NOTE — Progress Notes (Addendum)
0515 TF-like substance coming from nose. TF turned off and eLink notified. Will pass on to day shift nurse and CCM team.  0530- Slightly tachycardic now from 70-80 HR at shift change. T trend up and now 101F Axillary. Called eLink RN about Abd XR

## 2022-11-23 NOTE — Progress Notes (Signed)
LTM EEG reviewed from 11:50 PM to 2:15 AM. Continuous flatline activity without bursts. No electrographic seizures seen.   Electronically signed: Dr. Caryl Pina

## 2022-11-23 NOTE — Progress Notes (Addendum)
Subjective: Continues in a burst suppression pattern  Exam: Vitals:   11/23/22 0815 11/23/22 0908  BP: (!) 109/52 (!) 102/52  Pulse: 94   Resp: 18   Temp:  97.6 F (36.4 C)  SpO2: 94%    Gen: In bed, intubated Resp: ventilated   Neuro: PERRL, further neuro eval precluded by induced coma  Pertinent Labs: CSF RBC 643 CSF WBC 7 CSF protein 18 CSF glucose 80  Biofire meningitis/encephalitis panel-negative  CSF CMV by PCR-negative CSF EBV-negative  CSF autoimmune encephalitis panel([facility])-negative CSF IgG index-negative CSF OC bands - negative   CRP-less than 0.2 ESR-2 ANA-negative ANCA - negative Thyroglobulin-pending Thyroid peroxidase - negative SSA/SSB-negative Serum autoimmune encephalitis panel([facility])-negative  RPR-negative HIV-negative Ammonia 15 TSH 1.5 Admission creatinine 1.17 UDS - negative  Impression: 22 year old male with fairly rapidly progressive encephalopathy and refractory seizures amounting to localized new onset refractory status epilepticus with multifocal areas of T2 signal change and a single area of restricted diffusion on MRI.  With relatively bland CSF, no fever or leukocytosis, I think that infectious etiology is very unlikely.  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  Repeat head CT is stable, and I continue to suspect at this time that the most likely culprit of this super refractory new onset status epilepticus to be autoimmune in nature given the lack of any infectious prodrome, and relatively bland CSF (I do not think a WBC of 7 in a patient without clinical evidence of infection such as fever or leukocytosis to be indicative of infectious process).  Therefore I continue to suspect that the most likely culprit is autoimmune and would favor continued steroid and plasma exchange.  Will plan to try  lightening today. If he begins seizing again, will use pentobarbital to re-sedate  Recommendations: 1) continue Keppra at 1500 twice daily 2) continue Depacon 750 3 times daily, check level  3) lacosamide 200 mg twice daily 4) continue Solu-Medrol 1 g daily, day 5/5 5) plasmapheresis tx 3/5 today, plan today, Monday, wednesday 6) continue burst suppression with ketamine, propofol and versed 7) neurology will continue to follow  This patient is critically ill and at significant risk of neurological worsening, death and care requires constant monitoring of vital signs, hemodynamics,respiratory and cardiac monitoring, neurological assessment, discussion with family, other specialists and medical decision making of high complexity. I spent 45 minutes of neurocritical care time  in the care of  this patient. This was time spent independent of any time provided by nurse practitioner or PA.  Ritta Slot, MD [facility] [phone]  If 7pm- 7am, please page neurology on call as listed in AMION. 11/23/2022  9:26 AM

## 2022-11-24 ENCOUNTER — Inpatient Hospital Stay (HOSPITAL_COMMUNITY): Payer: BC Managed Care – PPO

## 2022-11-24 DIAGNOSIS — A86 Unspecified viral encephalitis: Secondary | ICD-10-CM | POA: Diagnosis not present

## 2022-11-24 DIAGNOSIS — R609 Edema, unspecified: Secondary | ICD-10-CM | POA: Diagnosis not present

## 2022-11-24 LAB — PROTIME-INR
INR: 1.5 — ABNORMAL HIGH (ref 0.8–1.2)
Prothrombin Time: 18.2 s — ABNORMAL HIGH (ref 11.4–15.2)

## 2022-11-24 LAB — BASIC METABOLIC PANEL
Anion gap: 12 (ref 5–15)
BUN: 46 mg/dL — ABNORMAL HIGH (ref 6–20)
CO2: 33 mmol/L — ABNORMAL HIGH (ref 22–32)
Calcium: 8.6 mg/dL — ABNORMAL LOW (ref 8.9–10.3)
Chloride: 113 mmol/L — ABNORMAL HIGH (ref 98–111)
Creatinine, Ser: 1.48 mg/dL — ABNORMAL HIGH (ref 0.61–1.24)
GFR, Estimated: >60 mL/min (ref >60)
Glucose, Bld: 174 mg/dL — ABNORMAL HIGH (ref 70–99)
Potassium: 3.9 mmol/L (ref 3.5–5.1)
Sodium: 158 mmol/L — ABNORMAL HIGH (ref 135–145)

## 2022-11-24 LAB — CBC
HCT: 41.6 % (ref 39.0–52.0)
Hemoglobin: 13 g/dL (ref 13.0–17.0)
MCH: 26.5 pg (ref 26.0–34.0)
MCHC: 31.3 g/dL (ref 30.0–36.0)
MCV: 84.9 fL (ref 80.0–100.0)
Platelets: 213 10*3/uL (ref 150–400)
RBC: 4.9 MIL/uL (ref 4.22–5.81)
RDW: 13.5 % (ref 11.5–15.5)
WBC: 11.6 10*3/uL — ABNORMAL HIGH (ref 4.0–10.5)
nRBC: 0.2 % (ref 0.0–0.2)

## 2022-11-24 LAB — CULTURE, BLOOD (ROUTINE X 2)

## 2022-11-24 LAB — GLUCOSE, CAPILLARY
Glucose-Capillary: 116 mg/dL — ABNORMAL HIGH (ref 70–99)
Glucose-Capillary: 126 mg/dL — ABNORMAL HIGH (ref 70–99)
Glucose-Capillary: 134 mg/dL — ABNORMAL HIGH (ref 70–99)
Glucose-Capillary: 165 mg/dL — ABNORMAL HIGH (ref 70–99)
Glucose-Capillary: 183 mg/dL — ABNORMAL HIGH (ref 70–99)
Glucose-Capillary: 187 mg/dL — ABNORMAL HIGH (ref 70–99)

## 2022-11-24 LAB — VALPROIC ACID LEVEL: Valproic Acid Lvl: 75 ug/mL (ref 50.0–100.0)

## 2022-11-24 LAB — PHOSPHORUS: Phosphorus: 4 mg/dL (ref 2.5–4.6)

## 2022-11-24 LAB — PHENOBARBITAL LEVEL: Phenobarbital: 29.6 ug/mL (ref 15.0–40.0)

## 2022-11-24 LAB — MAGNESIUM: Magnesium: 3.2 mg/dL — ABNORMAL HIGH (ref 1.7–2.4)

## 2022-11-24 MED ORDER — FREE WATER
300.0000 mL | Freq: Four times a day (QID) | Status: DC
  Administered 2022-11-24 – 2022-11-25 (×5): 300 mL

## 2022-11-24 NOTE — Progress Notes (Signed)
Pentobarbitol held for 20 minutes per MD [name]

## 2022-11-24 NOTE — Progress Notes (Addendum)
Subjective: Was slightly light, so rate was increased from 3 to 4, now over suppressed  Exam: Vitals:   11/24/22 0900 11/24/22 0916  BP: (!) 101/56 (!) 100/57  Pulse: 98 100  Resp: 18 18  Temp:  99.6 F (37.6 C)  SpO2: 95% 95%   Gen: In bed, intubated Resp: ventilated   Neuro: Pupils are 4 mm and ? reactive, further neuro eval precluded by induced coma  Pertinent Labs: CSF RBC 643 CSF WBC 7 CSF protein 18 CSF glucose 80  Biofire meningitis/encephalitis panel-negative  CSF CMV by PCR-negative CSF EBV-negative  CSF autoimmune encephalitis panel([facility])-negative CSF IgG index-negative CSF OC bands - negative   CRP-less than 0.2 ESR-2 ANA-negative ANCA - negative Thyroglobulin-negative Thyroid peroxidase - negative SSA/SSB-negative Serum autoimmune encephalitis panel([facility])-negative  RPR-negative HIV-negative Ammonia 15 TSH 1.5 Admission creatinine 1.17 UDS - negative  Impression: 22 year old male with fairly rapidly progressive encephalopathy and refractory seizures amounting to localized new onset refractory status epilepticus with multifocal areas of T2 signal change and a single area of restricted diffusion on MRI.  With relatively bland CSF, no fever or leukocytosis, I think that infectious etiology is very unlikely.  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  Repeat head CT is stable, and I continue to suspect at this time that the most likely culprit of this super refractory new onset status epilepticus to be autoimmune in nature given the lack of any infectious prodrome, and relatively bland CSF (I do not think a WBC of 7 in a patient without clinical evidence of infection such as fever or leukocytosis to be indicative of infectious process).  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 We will continue plasmapheresis as a significant percentage of these folks are still presumed to be immune mediated.   Recommendations: 1) continue Keppra at 1500 twice daily 2) continue Depacon 750 3 times daily, check level  3) lacosamide 200 mg twice daily 4) Solu-Medrol completed 6/28 5) plasmapheresis tx 3/5 today, plan today, Monday, wednesday 6) continue burst suppression with pentobarbital 7) repeat MRI  This patient is critically ill and at significant risk of neurological worsening, death and care requires constant monitoring of vital signs, hemodynamics,respiratory and cardiac monitoring, neurological assessment, discussion with family, other specialists and medical decision making of high complexity. I spent 45 minutes of neurocritical care time  in the care of  this patient. This was time spent independent of any time provided by nurse practitioner or PA.  Ritta Slot, MD [facility] [phone]  If 7pm- 7am, please page neurology on call as listed in AMION. 11/24/2022  9:30 AM

## 2022-11-24 NOTE — Progress Notes (Signed)
LTM EEG hooked up and running - no initial skin breakdown - push button tested - [facility] monitoring. Pt went to MRI and then came back for a full hook up redo

## 2022-11-24 NOTE — Progress Notes (Signed)
175 mL of Midazolam (1mg /mL) and 100 mL of Ketamine (10mg /mL) wasted with Harriett Sine, RN

## 2022-11-24 NOTE — Progress Notes (Signed)
LTM EEG reviewed from 11:30 PM to 1:55 AM. Burst suppression pattern is noted, with bursts ranging in duration from 1.5 to 6 seconds. Occasional sharp waves were noted. No electrographic seizures seen.   Burst suppression pattern is not completely optimized, but without electrographic seizures. Therefore will increase rate of pentobarbital gtt by 1 mg/kg/hr (currently at a rate of 3, increasing rate to 4) without a bolus.   Electronically signed: Dr. Caryl Pina

## 2022-11-24 NOTE — Progress Notes (Addendum)
Pentobarb restarted at 3 per order

## 2022-11-24 NOTE — Progress Notes (Signed)
NAME:  Travarious Pangelinan, MRN:  [id number], DOB:  2000/06/21, LOS: 6 ADMISSION DATE:  11/17/2022 CONSULTATION DATE:  11/19/2022 REFERRING MD:  [name] - Neuro CHIEF COMPLAINT:  AMS, hypertension  History of Present Illness:  22 year old man who presented to [facility] 6/23 with HA and blurry vision. PMHx significant for asthma, ADD.   Recently presented to urgent care 6/21 for R-sided headache, blurry vision and nausea x 4 days. HA improved with Tylenol. Denied photophobia. Recently seen by eye doctor 2-3 weeks prior and was prescribed contacts. Also reports impotence x 3-4 days. Hypertensive to 190/104 at [facility], sent home with plan for PCP follow up for hypertension. Presented to [facility] 6/23 with persistent HA, blurry vision and confusion for ~ 1 week with new onset of lightheadedness, SOB and CP. Also reported new onset of R arm weakness/paresthesias. Had acute onset of speech disturbance and gait changes 1 hour prior to presentation at [facility]. Hypertensive to 174/110. Neuro consulted and advised patient to present to ED for further workup.  Patient arrived to [facility] ED 6/23 and at that point had continued speech disturbance and blurry vision/unsteady gait. On ED arrival, hypertensive to 171/117, low grade temp 99.36F, HR 97, mildly disoriented. Labs were grossly unremarkable. CT Head 6/23 NAICA. MRI Brain 6/23 with multifocal cortical edema within both hemispheres, R > L, concerning for viral encephalitis. LP was completed in ED per Neuro recommendations and patient was started on empiric meningitis coverage (ceftriaxone, vanc, acyclovir). Meningitis/encephalitis panel including HSV 1/2 PCR, EBV, CMV and VDRL ordered as well as EEG. Patient admitted to [facility] service. CSF not consistent with infection, empiric steroids started for likely autoimmune/inflammatory process with EEG +seizure activity, Ativan and Keppra administered and LTM EEG started. ID was consulted with recommendation for discontinuation of antibiotics.  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 PCCM consulted for transfer to ICU, possible intubation for burst suppression to control status epilepticus and CVC placement for PLEX/IVIG.  Pertinent Medical History:   Past Medical History:  Diagnosis Date   ADD (attention deficit disorder)    Asthma    Significant Hospital Events: Including procedures, antibiotic start and stop dates in addition to other pertinent events   6/23 - Presented to ED from [facility] for persistent HA, blurry vision, hypertension, R-sided arm weakness/paresthesias. CT Head NAICA. MRI Brain with multifocal cortical edema within both hemispheres, R > L, concerning for viral encephalitis. Empiric meningitis coverage (CTX, vanc, acyclovir). LP not consistent with infection. 6/24 - LTM EEG with seizure activity. ID consulted, signed off after unlikely infectious etiology. 6/25 - PCCM consulted for ICU transfer, intubation for burst suppression, Trialysis catheter placement for PLEX/IVIG. 6/26 - LTM EEG/Prop for burst suppression, PLEX day 1 6/28 PICC Placed  Interim History / Subjective:  Attempted prop wean yesterday unsuccessful with emergence of more ictal pattern: pentobarb gtt started.  Remains comatose on vent.  Some increase in O2 needs despite diuresis  Copious diarrhea  No secretions to send for culture  Fever curve improved with removal of foley  Objective:  Blood pressure 102/63, pulse 97, temperature 99.4 F (37.4 C), temperature source Axillary, resp. rate 18, height 5\' 11"  (1.803 m), weight 92.5 kg, SpO2 94 %.    Vent Mode: PRVC FiO2 (%):  [40 %-60 %] 60 % Set Rate:  [18 bmp] 18 bmp Vt Set:  [600 mL] 600 mL PEEP:  [5 cmH20] 5 cmH20 Plateau Pressure:  [15 cmH20-17 cmH20] 15 cmH20   Intake/Output Summary (  Last 24 hours) at 11/24/2022 0747 Last data filed at 11/24/2022 0700 Gross per 24 hour  Intake 5295.29 ml  Output 7510 ml  Net -2214.71 ml     Filed Weights   11/23/22 0500 11/23/22 0916 11/24/22 0500  Weight: 95.6 kg 96 kg 92.5 kg   Physical Examination: RASS -5 Pupils pinpoint reactive Developing some ?angioedema Burst supression on EEG Abd soft, hypoactive BS Lungs occasional rhonci, passive on vent Heart sounds regular  Sodium, Cr up WBC improved  Assessment & Plan:   Acute, rapidly progressive encephalopathy, concerning for autoimmune encephalitis, viral less likely Status epilepticus - Empiric meningitis coverage discontinued 6/24 P:  - PLEX, burst suppression, imaging, and steroids per neurology  Acute hypoxemic respiratory failure in the setting of status epilepticus Possible aspiration pneumonia  Fevers (6/29)- improved with foley removal; Pct neg Worsened hypernatremia- from diarrhea + lasix O2 needs up despite diuretic challenge - Recheck CXR - Check LE duplex - Hold lasix today, increase FWF; hold further bowel regimen - Tentative plan for trach with Dr. Merrily Pew on Friday 7/5 as patient will be in an induced coma/vent-dependent for quit a while; will need to discuss with family this week  ?aspiration/TF intolerance- TF to goal, no issues despite question of TF from nose 6/29  Hypotension and Bradycardia, related to sedation - resolved, dopamine available PRN  Sedation related hypotension- levophed as needed  History of ADD- Not on home medications  Best Practice: (right click and "Reselect all SmartList Selections" daily)   Diet/type: NPO; TF  DVT prophylaxis: SCDs, Lovenox GI prophylaxis: PPI Lines: Dialysis Catheter + pigtail for PLEX, PICC placed 6/28 Foley:  Yes, and it is still needed Code Status:  full code Last date of multidisciplinary goals of care discussion [updated when they come in; plan is still to push forward]  Critical care time: 35 min.    Myrla Halsted MD PCCM [facility] For pager details, please see AMION or use Epic chat  After 1900,  please call [employer] for cross coverage needs 11/24/2022, 7:47 AM

## 2022-11-24 NOTE — Procedures (Signed)
Patient Name: Colton Zhang  MRN: [id number]  Epilepsy Attending: Charlsie Quest  Referring Physician/Provider: Charlsie Quest, MD  Duration: 11/23/2022 1016 to 11/24/2022 1016   Patient history: 22 y.o. male with no significant PMH who presents with progressive confusion, lethargy, headache over the last week. MRI Brain notable for cortical edema within both hemispheres, mostly in right though. EEG to evaluate for seizure   Level of alertness: comatose   AEDs during EEG study: LEV, Phenobarb, Perampanel, Pentobarb   Technical aspects: This EEG study was done with scalp electrodes positioned according to the 10-20 International system of electrode placement. Electrical activity was reviewed with band pass filter of 1-70Hz , sensitivity of 7 uV/mm, display speed of 63mm/sec with a 60Hz  notched filter applied as appropriate. EEG data were recorded continuously and digitally stored.  Video monitoring was not available.   Description: At the beginning of study, EEG showed near continuous with 3-7hz  theta-delta sowing admixed with generalized periodic spikes at 1hz . Gradually after around 1130 on 11/23/2022, pentobarb was started. Subsequently, EEG showed burst suppression pattern with bursts of sharply contoured 3-6hz  theta-delta slowing lasting 1-6 seconds alternating with generalized EEG suppression. Initially after pentobarb load, the inter-burst interval was about 30 seconds which gradually reduced to 7-10 seconds. Pentobarb rate ws adjusted around 0300 on 11/24/2022 after which duration of burst reduced to 0.5-2 seconds, rarely lasting upto 4 seconds with 15-20 seconds of eeg suppression. After around 0600 on 11/24/2022, the inter-burst inteval increased to 5-7 minutes.   ABNORMALITY - Burst suppression, generalized   IMPRESSION: This study is suggestive of profound diffuse encephalopathy consistent with pentobarb coma. No definite seizures were noted.    Nazly Digilio [name]

## 2022-11-24 NOTE — Progress Notes (Signed)
Tmax 102.3. Tylenol given and patient packed with ice. CCM aware.

## 2022-11-24 NOTE — Progress Notes (Signed)
Pentobarb to be held for 20 minutes and then restarted at 2.5 per MD [name].

## 2022-11-24 NOTE — Progress Notes (Signed)
Disconnected EEG leads for MRI will reconnect after.

## 2022-11-24 NOTE — Progress Notes (Signed)
VASCULAR LAB    Bilateral lower extremity venous duplex has been performed.  See CV proc for preliminary results.   Krislynn Gronau, RVT 11/24/2022, 3:31 PM

## 2022-11-24 NOTE — Progress Notes (Signed)
Pt transported from 4N25 to MRI and back to 4N25 with RN X 2 with no complications.

## 2022-11-24 NOTE — Progress Notes (Signed)
Pentobarb held for 30 minutes per MD [name]

## 2022-11-25 DIAGNOSIS — G9340 Encephalopathy, unspecified: Secondary | ICD-10-CM | POA: Diagnosis not present

## 2022-11-25 DIAGNOSIS — G40901 Epilepsy, unspecified, not intractable, with status epilepticus: Secondary | ICD-10-CM

## 2022-11-25 DIAGNOSIS — J9601 Acute respiratory failure with hypoxia: Secondary | ICD-10-CM | POA: Diagnosis present

## 2022-11-25 LAB — GLUCOSE, CAPILLARY
Glucose-Capillary: 124 mg/dL — ABNORMAL HIGH (ref 70–99)
Glucose-Capillary: 130 mg/dL — ABNORMAL HIGH (ref 70–99)
Glucose-Capillary: 131 mg/dL — ABNORMAL HIGH (ref 70–99)
Glucose-Capillary: 135 mg/dL — ABNORMAL HIGH (ref 70–99)
Glucose-Capillary: 136 mg/dL — ABNORMAL HIGH (ref 70–99)
Glucose-Capillary: 139 mg/dL — ABNORMAL HIGH (ref 70–99)

## 2022-11-25 LAB — VALPROIC ACID LEVEL: Valproic Acid Lvl: 72 ug/mL (ref 50.0–100.0)

## 2022-11-25 LAB — PHOSPHORUS: Phosphorus: 3.2 mg/dL (ref 2.5–4.6)

## 2022-11-25 LAB — CBC
HCT: 40.9 % (ref 39.0–52.0)
Hemoglobin: 12.9 g/dL — ABNORMAL LOW (ref 13.0–17.0)
MCH: 26.7 pg (ref 26.0–34.0)
MCHC: 31.5 g/dL (ref 30.0–36.0)
MCV: 84.5 fL (ref 80.0–100.0)
Platelets: 182 10*3/uL (ref 150–400)
RBC: 4.84 MIL/uL (ref 4.22–5.81)
RDW: 13.9 % (ref 11.5–15.5)
WBC: 10.9 10*3/uL — ABNORMAL HIGH (ref 4.0–10.5)
nRBC: 0.2 % (ref 0.0–0.2)

## 2022-11-25 LAB — BASIC METABOLIC PANEL
Anion gap: 15 (ref 5–15)
BUN: 39 mg/dL — ABNORMAL HIGH (ref 6–20)
CO2: 36 mmol/L — ABNORMAL HIGH (ref 22–32)
Calcium: 8.4 mg/dL — ABNORMAL LOW (ref 8.9–10.3)
Chloride: 104 mmol/L (ref 98–111)
Creatinine, Ser: 1.1 mg/dL (ref 0.61–1.24)
GFR, Estimated: >60 mL/min (ref >60)
Glucose, Bld: 138 mg/dL — ABNORMAL HIGH (ref 70–99)
Potassium: 4.2 mmol/L (ref 3.5–5.1)
Sodium: 155 mmol/L — ABNORMAL HIGH (ref 135–145)

## 2022-11-25 LAB — MAGNESIUM: Magnesium: 3.4 mg/dL — ABNORMAL HIGH (ref 1.7–2.4)

## 2022-11-25 LAB — PHENOBARBITAL LEVEL: Phenobarbital: 28.3 ug/mL (ref 15.0–40.0)

## 2022-11-25 LAB — CULTURE, BLOOD (ROUTINE X 2): Special Requests: ADEQUATE

## 2022-11-25 MED ORDER — ACETAMINOPHEN 325 MG PO TABS: 650.0000 mg | ORAL_TABLET | ORAL | Status: DC | PRN

## 2022-11-25 MED ORDER — CALCIUM GLUCONATE-NACL 2-0.675 GM/100ML-% IV SOLN
2.0000 g | Freq: Once | INTRAVENOUS | Status: AC
  Administered 2022-11-25: 2000 mg via INTRAVENOUS
  Filled 2022-11-25 (×2): qty 100

## 2022-11-25 MED ORDER — FREE WATER
300.0000 mL | Status: DC
  Administered 2022-11-25 – 2022-11-27 (×12): 300 mL

## 2022-11-25 MED ORDER — ALBUTEROL SULFATE (2.5 MG/3ML) 0.083% IN NEBU: 2.5000 mg | INHALATION_SOLUTION | RESPIRATORY_TRACT | Status: DC | PRN

## 2022-11-25 MED ORDER — DIPHENHYDRAMINE HCL 25 MG PO CAPS: 25.0000 mg | ORAL_CAPSULE | Freq: Four times a day (QID) | ORAL | Status: DC | PRN

## 2022-11-25 MED ORDER — CALCIUM CARBONATE ANTACID 500 MG PO CHEW
2.0000 | CHEWABLE_TABLET | ORAL | Status: AC
  Administered 2022-11-25 (×2): 400 mg via ORAL
  Filled 2022-11-25 (×2): qty 2

## 2022-11-25 MED ORDER — PERAMPANEL 8 MG PO TABS
8.0000 mg | ORAL_TABLET | Freq: Every day | ORAL | Status: DC
  Administered 2022-11-25 – 2022-11-27 (×3): 8 mg
  Filled 2022-11-25 (×3): qty 1

## 2022-11-25 MED ORDER — ACD FORMULA A 0.73-2.45-2.2 GM/100ML VI SOLN
1000.0000 mL | Status: DC
  Administered 2022-11-25: 1000 mL
  Filled 2022-11-25 (×3): qty 1000

## 2022-11-25 MED ORDER — HEPARIN SODIUM (PORCINE) 1000 UNIT/ML IJ SOLN
1000.0000 [IU] | Freq: Once | INTRAMUSCULAR | Status: AC
  Administered 2022-11-25: 1000 [IU]

## 2022-11-25 MED ORDER — ACETAMINOPHEN 325 MG PO TABS
650.0000 mg | ORAL_TABLET | ORAL | Status: DC | PRN
  Administered 2022-11-25 – 2022-11-26 (×5): 650 mg
  Filled 2022-11-25 (×4): qty 2

## 2022-11-25 MED ORDER — SODIUM CHLORIDE 0.9 % IV SOLN
INTRAVENOUS | Status: AC
  Filled 2022-11-25 (×3): qty 200

## 2022-11-25 MED ORDER — HEPARIN SODIUM (PORCINE) 1000 UNIT/ML IJ SOLN
INTRAMUSCULAR | Status: AC
  Filled 2022-11-25: qty 3

## 2022-11-25 MED ORDER — ALBUMIN HUMAN 25 % IV SOLN: Freq: Once | INTRAVENOUS | Status: DC

## 2022-11-25 NOTE — Progress Notes (Signed)
   11/25/22 1116  Vitals  Temp 98.7 F (37.1 C)  Temp Source Axillary  Pulse Rate 96  ECG Heart Rate 96  Resp 18  BP 132/86  Oxygen Therapy  SpO2 97 %  O2 Device Ventilator  FiO2 (%) 40 %  Critical Care Pain Observation Tool (CPOT)  Facial Expression 0  Body Movements 0  Muscle Tension 0  Compliance with ventilator (intubated pts.) 0  Vocalization (extubated pts.) N/A  CPOT Total 0  Height and Weight  Weight 93.5 kg  Apheresis   Procedure Comments Tx. Completed. VSS, and Report Given  to Bedside RN Shelbie Hutching. Pt next  TPE tx # 5 is Wednesday 11/27/2022  Post-apheresis  Net Removed (mL) 3998 mL  Replacement (mL) 3086 mL  ACDA infused (mL) 751 mL  Total Normal Saline Administered 165 mL  Total Calcium Administered 100 mL  Tolerated Procedure Yes  Post-Procedure Comments Pt. tolerated procedure well. Tx without  difficulties.  Hemodialysis Catheter Right Internal jugular Triple lumen Temporary (Non-Tunneled)  Placement Date/Time: 11/19/22 1830   Time Out: Correct patient;Correct procedure;Correct site  Maximum sterile barrier precautions: Hand hygiene;Cap;Mask;Sterile gown;Sterile gloves;Large sterile sheet  Site Prep: Chlorhexidine (preferred)  Local Anes...  Site Condition No complications  Blue Lumen Status Flushed;Dead end cap in place;Heparin locked;Blood return noted  Red Lumen Status Flushed;Dead end cap in place;Heparin locked;Blood return noted  Purple Lumen Status Infusing  Catheter fill solution Heparin 1000 units/ml  Catheter fill volume (Arterial) 1.2 cc  Catheter fill volume (Venous) 1.2  Dressing Type Transparent  Dressing Status Antimicrobial disc in place  Drainage Description None  Dressing Change Due 11/29/22  Post treatment catheter status Capped and Clamped

## 2022-11-25 NOTE — Progress Notes (Signed)
NAME:  Colton Zhang, MRN:  [id number], DOB:  23-Apr-2001, LOS: 7 ADMISSION DATE:  11/17/2022, CONSULTATION DATE:  11/19/2022 REFERRING MD:  [name]- Neuro, CHIEF COMPLAINT:  AMS, HTN   History of Present Illness:  22 year old man who presented to [facility] 6/23 with HA and blurry vision. PMHx significant for asthma, ADD.    Recently presented to urgent care 6/21 for R-sided headache, blurry vision and nausea x 4 days. HA improved with Tylenol. Denied photophobia. Recently seen by eye doctor 2-3 weeks prior and was prescribed contacts. Also reports impotence x 3-4 days. Hypertensive to 190/104 at [facility], sent home with plan for PCP follow up for hypertension. Presented to [facility] 6/23 with persistent HA, blurry vision and confusion for ~ 1 week with new onset of lightheadedness, SOB and CP. Also reported new onset of R arm weakness/paresthesias. Had acute onset of speech disturbance and gait changes 1 hour prior to presentation at [facility]. Hypertensive to 174/110. Neuro consulted and advised patient to present to ED for further workup.   Patient arrived to [facility] ED 6/23 and at that point had continued speech disturbance and blurry vision/unsteady gait. On ED arrival, hypertensive to 171/117, low grade temp 99.19F, HR 97, mildly disoriented. Labs were grossly unremarkable. CT Head 6/23 NAICA. MRI Brain 6/23 with multifocal cortical edema within both hemispheres, R > L, concerning for viral encephalitis. LP was completed in ED per Neuro recommendations and patient was started on empiric meningitis coverage (ceftriaxone, vanc, acyclovir). Meningitis/encephalitis panel including HSV 1/2 PCR, EBV, CMV and VDRL ordered as well as EEG. Patient admitted to [facility] service. CSF not consistent with infection, empiric steroids started for likely autoimmune/inflammatory process with EEG +seizure activity, Ativan and Keppra administered and LTM EEG started. ID was consulted with recommendation for discontinuation of antibiotics.   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 PCCM consulted for transfer to ICU, possible intubation for burst suppression to control status epilepticus and CVC placement for PLEX/IVIG.  Pertinent  Medical History  ADD/ Asthma  Significant Hospital Events: Including procedures, antibiotic start and stop dates in addition to other pertinent events   6/23 - Presented to ED from [facility] for persistent HA, blurry vision, hypertension, R-sided arm weakness/paresthesias. CT Head NAICA. MRI Brain with multifocal cortical edema within both hemispheres, R > L, concerning for viral encephalitis. Empiric meningitis coverage (CTX, vanc, acyclovir). LP not consistent with infection. 6/24 - LTM EEG with seizure activity. ID consulted, signed off after unlikely infectious etiology. 6/25 - PCCM consulted for ICU transfer, intubation for burst suppression, Trialysis catheter placement for PLEX/IVIG. 6/26 - LTM EEG/Prop for burst suppression, PLEX day 1 6/28 PICC Placed  Interim History / Subjective:  Tmax 100.68F, noted to have copious diarrhea over weekend. Remains comatose on ventilator- pentobarb gtt in burst suppression  Objective   Blood pressure 121/75, pulse 88, temperature 99.4 F (37.4 C), temperature source Temporal, resp. rate 18, height 5\' 11"  (1.803 m), weight 93.5 kg, SpO2 96 %.    Vent Mode: PRVC FiO2 (%):  [40 %-50 %] 40 % Set Rate:  [18 bmp] 18 bmp Vt Set:  [600 mL] 600 mL PEEP:  [5 cmH20] 5 cmH20 Plateau Pressure:  [15 cmH20-19 cmH20] 19 cmH20   Intake/Output Summary (Last 24 hours) at 11/25/2022 0919 Last data filed at 11/25/2022 0800 Gross per 24 hour  Intake 3392.44 ml  Output 3400 ml  Net -7.56 ml   Filed Weights   11/23/22 0916 11/24/22 0500 11/25/22  0500  Weight: 96 kg 92.5 kg 93.5 kg    Examination:   Physical Exam Constitutional:      Comments: Comatose   HENT:     Head: Normocephalic.  Cardiovascular:     Rate and  Rhythm: Normal rate and regular rhythm.     Pulses: Normal pulses.     Heart sounds: Normal heart sounds.  Pulmonary:     Comments: Synchronous on ventilator  Abdominal:     General: Abdomen is flat.     Palpations: Abdomen is soft.  Skin:    General: Skin is warm.  Neurological:     Comments: PERRLA- comatose on phenobarb, does not follow commands, no purposeful movement     Resolved Hospital Problem list   N/A  Assessment & Plan:  Acute, rapidly progressive encephalopathy, concerning for autoimmune encephalitis, viral less likely Status epilepticus EEG spikes still appear epileptogenic - Empiric meningitis coverage discontinued 6/24 P:  - PLEX, burst suppression, imaging, and steroids per neurology -Increase pentobarb to 4mg /kg/hr on 7/1 to increase sedation -pt remains on keppra,  valproate, and perampanel   Acute hypoxemic respiratory failure in the setting of status epilepticus Possible aspiration pneumonia  Fevers (6/29) - improved with foley removal; Pct neg, WBC stable at 10.9 on 7/1 - Recheck CXR (-) - Tentative plan for trach with Dr. Merrily Pew on Friday 7/5 as patient will be in an induced coma/vent-dependent for quit a while; will need to discuss with family this week  Hypernatremia- from diarrhea + lasix O2 needs up despite diuretic challenge - Increase FWF to q4hr; hold further bowel regimen due to diarrhea     ?Aspiration/TF intolerance- TF to goal, no issues despite question of TF from nose 6/29   Hypotension and Bradycardia, related to sedation - resolved, dopamine available PRN   Sedation related hypotension- levophed as needed   History of ADD- Not on home medications  Best Practice (right click and "Reselect all SmartList Selections" daily)   Diet/type: tubefeeds DVT prophylaxis: SCD and lovenox GI prophylaxis: PPI Lines: Dialysis Catheter and pigtail for PLEX, PICC placed 6/28 Foley:  Yes, and it is still needed Code Status:  full  code Last date of multidisciplinary goals of care discussion [11/24/2022]  Labs   CBC: Recent Labs  Lab 11/21/22 0415 11/22/22 0500 11/23/22 0511 11/24/22 0500 11/25/22 0538  WBC 11.6* 12.9* 14.1* 11.6* 10.9*  HGB 13.0 13.5 12.3* 13.0 12.9*  HCT 39.4 41.5 38.4* 41.6 40.9  MCV 80.1 80.7 85.0 84.9 84.5  PLT 305 284 241 213 182    Basic Metabolic Panel: Recent Labs  Lab 11/21/22 0415 11/21/22 1700 11/22/22 0500 11/23/22 0511 11/24/22 0500 11/25/22 0538  NA 142  --  150* 151* 158* 155*  K 5.2*  --  4.9 4.5 3.9 4.2  CL 106  --  113* 119* 113* 104  CO2 27  --  26 27 33* 36*  GLUCOSE 156*  --  163* 183* 174* 138*  BUN 28*  --  31* 37* 46* 39*  CREATININE 1.28*  --  1.21 1.26* 1.48* 1.10  CALCIUM 8.5*  --  8.6* 7.6* 8.6* 8.4*  MG 2.9* 2.8* 3.0* 2.9* 3.2* 3.4*  PHOS 4.9* 4.1 4.1 2.7 4.0 3.2   GFR: Estimated Creatinine Clearance: 125.2 mL/min (by C-G formula based on SCr of 1.1 mg/dL). Recent Labs  Lab 11/22/22 0500 11/23/22 0511 11/24/22 0500 11/25/22 0538  PROCALCITON  --  <0.10  --   --   WBC 12.9* 14.1*  11.6* 10.9*    Liver Function Tests: No results for input(s): "AST", "ALT", "ALKPHOS", "BILITOT", "PROT", "ALBUMIN" in the last 168 hours. No results for input(s): "LIPASE", "AMYLASE" in the last 168 hours. No results for input(s): "AMMONIA" in the last 168 hours.  ABG    Component Value Date/Time   PHART 7.425 11/19/2022 1921   PCO2ART 39.1 11/19/2022 1921   PO2ART 328 (H) 11/19/2022 1921   HCO3 25.6 11/19/2022 1921   TCO2 27 11/19/2022 1921   O2SAT 100 11/19/2022 1921     Coagulation Profile: Recent Labs  Lab 11/20/22 0836 11/24/22 1003  INR 1.2 1.5*    Cardiac Enzymes: No results for input(s): "CKTOTAL", "CKMB", "CKMBINDEX", "TROPONINI" in the last 168 hours.  HbA1C: No results found for: "HGBA1C"  CBG: Recent Labs  Lab 11/24/22 1533 11/24/22 1957 11/24/22 2331 11/25/22 0321 11/25/22 0724  GLUCAP 165* 126* 116* 136* 135*    Review  of Systems:   Negative except as listed in POC and HPI.  Past Medical History:  He,  has a past medical history of ADD (attention deficit disorder) and Asthma.   Surgical History:   Past Surgical History:  Procedure Laterality Date   CIRCUMCISION       Social History:   reports that he has never smoked. He has never used smokeless tobacco. He reports that he does not drink alcohol and does not use drugs.   Family History:  His family history includes Cirrhosis in his father; Heart failure in his paternal grandmother; Migraines in his brother; Stroke in his paternal grandfather.   Allergies Allergies  Allergen Reactions   Other     Seasonal Allergies     Home Medications  Prior to Admission medications   Not on File     Critical care time: 45 minutes    Janeece Riggers, AGACNP-BC [facility] For pager details, please see AMION or use EPIC chat After 1900, please call ELINK for cross coverage needs 11/25/2022 9:19 AM

## 2022-11-25 NOTE — Procedures (Addendum)
Patient Name: Rector Belyeu  MRN: [id number]  Epilepsy Attending: Charlsie Quest  Referring Physician/Provider: Charlsie Quest, MD  Duration: 11/24/2022 1016 to 11/25/2022 1016   Patient history: 22 y.o. male with no significant PMH who presents with progressive confusion, lethargy, headache over the last week. MRI Brain notable for cortical edema within both hemispheres, mostly in right though. EEG to evaluate for seizure   Level of alertness: comatose   AEDs during EEG study: LEV, Phenobarb, Perampanel, Pentobarb   Technical aspects: This EEG study was done with scalp electrodes positioned according to the 10-20 International system of electrode placement. Electrical activity was reviewed with band pass filter of 1-70Hz , sensitivity of 7 uV/mm, display speed of 72mm/sec with a 60Hz  notched filter applied as appropriate. EEG data were recorded continuously and digitally stored.  Video monitoring was not available.   Description: EEG showed burst suppression pattern with bursts of sharply contoured 3-6hz  theta-delta slowing admixed with generalized spike lasting 0.5-2 second alternating with generalized EEG suppression lasting 8 seconds to 1 minute.    ABNORMALITY - Burst suppression, generalized - Spike, generalized   IMPRESSION: This study  showed evidence of epileptogenicity with generalized onset as well as profound diffuse encephalopathy consistent with pentobarb coma. No definite seizures were noted.    Abie Killian [name]

## 2022-11-25 NOTE — Progress Notes (Signed)
Subjective: Continues to be in pentobarb coma, pentobarb increased from 2.5 to 4 by Dr. Denese Killings this morning  ROS: Unable to obtain due to poor mental status  Examination  Vital signs in last 24 hours: Temp:  [98.7 F (37.1 C)-102.3 F (39.1 C)] 99.3 F (37.4 C) (07/01 1200) Pulse Rate:  [88-102] 99 (07/01 1200) Resp:  [14-18] 18 (07/01 1200) BP: (98-132)/(61-92) 132/92 (07/01 1200) SpO2:  [87 %-98 %] 96 % (07/01 1200) FiO2 (%):  [40 %-50 %] 40 % (07/01 1126) Weight:  [93.5 kg] 93.5 kg (07/01 1116)  General: lying in bed, intubated Neuro: Pupils appear to be about 3 mm, not reacting to light, corneal reflex absent, cough is excepting, does not withdraw to noxious stimuli in upper extremities  Basic Metabolic Panel: Recent Labs  Lab 11/21/22 0415 11/21/22 1700 11/22/22 0500 11/23/22 0511 11/24/22 0500 11/25/22 0538  NA 142  --  150* 151* 158* 155*  K 5.2*  --  4.9 4.5 3.9 4.2  CL 106  --  113* 119* 113* 104  CO2 27  --  26 27 33* 36*  GLUCOSE 156*  --  163* 183* 174* 138*  BUN 28*  --  31* 37* 46* 39*  CREATININE 1.28*  --  1.21 1.26* 1.48* 1.10  CALCIUM 8.5*  --  8.6* 7.6* 8.6* 8.4*  MG 2.9* 2.8* 3.0* 2.9* 3.2* 3.4*  PHOS 4.9* 4.1 4.1 2.7 4.0 3.2    CBC: Recent Labs  Lab 11/21/22 0415 11/22/22 0500 11/23/22 0511 11/24/22 0500 11/25/22 0538  WBC 11.6* 12.9* 14.1* 11.6* 10.9*  HGB 13.0 13.5 12.3* 13.0 12.9*  HCT 39.4 41.5 38.4* 41.6 40.9  MCV 80.1 80.7 85.0 84.9 84.5  PLT 305 284 241 213 182     Coagulation Studies: Recent Labs    11/24/22 1003  LABPROT 18.2*  INR 1.5*    Imaging MRI brain with and without contrast 11/17/2022: Multifocal cortical edema within both hemispheres, but mostly on the right. While nonspecific, this is most concerning for viral encephalitis. CSF sampling should be considered.  MRI brain without contrast 11/24/2022: Persistent multifocal T2 hyperintensity/edema involving the right greater than left cerebral hemispheres and  left cerebellum with overall very mild mixed interval changes as above and no completely new lesion. Again, this could reflect encephalitis.    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   New onset refractory status epilepticus -No evidence of infection on lumbar puncture.  Autoimmune panel negative  Recommendations -Continue pentobarbital for another 24 hours.  Will reevaluate tomorrow and consider weaning -Continue phenobarbital 97.5 mg twice daily, perampanel 8 mg daily, Keppra 1500 mg twice daily and Depakote 750 mg every 8 hours -Will discuss with pharmacy if tocilizumab is available to use after 5 rounds of plasmapheresis are done if EEG continues to show epileptic spikes/unable to wean off pentobarbital -MRI brain does show edema but no focal lesion that may be amenable to biopsy.  However will consider discussing with neurosurgery if unable to wean pentobarbital -Discussed about ketogenic diet with dietitian.  Unfortunately, we do not have ketogenic diet protocol for adults.  Will try to adjust tube feeds to minimize carbohydrates -Appreciate ICU teams care  CRITICAL CARE Performed by: Charlsie Quest   Total critical care time: 40 minutes  Critical care time was exclusive of separately billable procedures and treating other patients.  Critical  care was necessary to treat or prevent imminent or life-threatening deterioration.  Critical care was time spent personally by me on the following activities: development of treatment plan with patient and/or surrogate as well as nursing, discussions with consultants, evaluation of patient's response to treatment, examination of patient, obtaining history from patient or surrogate, ordering and performing treatments and interventions, ordering and review of laboratory studies,  ordering and review of radiographic studies, pulse oximetry and re-evaluation of patient's condition.     Lindie Spruce Epilepsy [facility] For questions after 5pm please refer to AMION to reach the Neurologist on call

## 2022-11-26 LAB — CBC
HCT: 42 % (ref 39.0–52.0)
Hemoglobin: 13.6 g/dL (ref 13.0–17.0)
MCH: 26.7 pg (ref 26.0–34.0)
MCHC: 32.4 g/dL (ref 30.0–36.0)
MCV: 82.4 fL (ref 80.0–100.0)
Platelets: 170 10*3/uL (ref 150–400)
RBC: 5.1 MIL/uL (ref 4.22–5.81)
RDW: 13.9 % (ref 11.5–15.5)
WBC: 13.4 10*3/uL — ABNORMAL HIGH (ref 4.0–10.5)
nRBC: 0.1 % (ref 0.0–0.2)

## 2022-11-26 LAB — BASIC METABOLIC PANEL
Anion gap: 11 (ref 5–15)
BUN: 46 mg/dL — ABNORMAL HIGH (ref 6–20)
CO2: 33 mmol/L — ABNORMAL HIGH (ref 22–32)
Calcium: 8 mg/dL — ABNORMAL LOW (ref 8.9–10.3)
Chloride: 104 mmol/L (ref 98–111)
Creatinine, Ser: 1.57 mg/dL — ABNORMAL HIGH (ref 0.61–1.24)
GFR, Estimated: >60 mL/min (ref >60)
Glucose, Bld: 145 mg/dL — ABNORMAL HIGH (ref 70–99)
Potassium: 4 mmol/L (ref 3.5–5.1)
Sodium: 148 mmol/L — ABNORMAL HIGH (ref 135–145)

## 2022-11-26 LAB — GLUCOSE, CAPILLARY
Glucose-Capillary: 111 mg/dL — ABNORMAL HIGH (ref 70–99)
Glucose-Capillary: 123 mg/dL — ABNORMAL HIGH (ref 70–99)
Glucose-Capillary: 125 mg/dL — ABNORMAL HIGH (ref 70–99)
Glucose-Capillary: 131 mg/dL — ABNORMAL HIGH (ref 70–99)
Glucose-Capillary: 141 mg/dL — ABNORMAL HIGH (ref 70–99)
Glucose-Capillary: 143 mg/dL — ABNORMAL HIGH (ref 70–99)

## 2022-11-26 LAB — MAGNESIUM: Magnesium: 3.3 mg/dL — ABNORMAL HIGH (ref 1.7–2.4)

## 2022-11-26 LAB — POCT I-STAT 7, (LYTES, BLD GAS, ICA,H+H)
Acid-Base Excess: 13 mmol/L — ABNORMAL HIGH (ref 0.0–2.0)
Bicarbonate: 37.5 mmol/L — ABNORMAL HIGH (ref 20.0–28.0)
Calcium, Ion: 1.02 mmol/L — ABNORMAL LOW (ref 1.15–1.40)
HCT: 41 % (ref 39.0–52.0)
Hemoglobin: 13.9 g/dL (ref 13.0–17.0)
O2 Saturation: 99 %
Patient temperature: 38.3
Potassium: 4 mmol/L (ref 3.5–5.1)
Sodium: 147 mmol/L — ABNORMAL HIGH (ref 135–145)
TCO2: 39 mmol/L — ABNORMAL HIGH (ref 22–32)
pCO2 arterial: 47.1 mmHg (ref 32–48)
pH, Arterial: 7.513 — ABNORMAL HIGH (ref 7.35–7.45)
pO2, Arterial: 141 mmHg — ABNORMAL HIGH (ref 83–108)

## 2022-11-26 LAB — VALPROIC ACID LEVEL: Valproic Acid Lvl: 78 ug/mL (ref 50.0–100.0)

## 2022-11-26 LAB — PHENOBARBITAL LEVEL: Phenobarbital: 27.6 ug/mL (ref 15.0–40.0)

## 2022-11-26 LAB — TRIGLYCERIDES: Triglycerides: 153 mg/dL — ABNORMAL HIGH (ref <150)

## 2022-11-26 LAB — CULTURE, BLOOD (ROUTINE X 2): Culture: NO GROWTH

## 2022-11-26 MED ORDER — ACETAMINOPHEN 160 MG/5ML PO SOLN
650.0000 mg | Freq: Four times a day (QID) | ORAL | Status: DC | PRN
  Administered 2022-11-26 – 2022-11-27 (×4): 650 mg
  Filled 2022-11-26 (×4): qty 20.3

## 2022-11-26 MED ORDER — IBUPROFEN 100 MG/5ML PO SUSP
200.0000 mg | Freq: Four times a day (QID) | ORAL | Status: DC | PRN
  Administered 2022-11-27 (×2): 200 mg
  Filled 2022-11-26 (×2): qty 10

## 2022-11-26 MED ORDER — CALCIUM GLUCONATE-NACL 2-0.675 GM/100ML-% IV SOLN
2.0000 g | Freq: Once | INTRAVENOUS | Status: AC
  Administered 2022-11-26: 2000 mg via INTRAVENOUS
  Filled 2022-11-26: qty 100

## 2022-11-26 NOTE — Progress Notes (Signed)
NAME:  Colton Zhang, MRN:  [id number], DOB:  2000/07/27, LOS: 8 ADMISSION DATE:  11/17/2022, CONSULTATION DATE:  11/19/2022 REFERRING MD:  [name]- Neuro, CHIEF COMPLAINT:  AMS, HTN   History of Present Illness:  22 year old man who presented to [facility] 6/23 with HA and blurry vision. PMHx significant for asthma, ADD.    Recently presented to urgent care 6/21 for R-sided headache, blurry vision and nausea x 4 days. HA improved with Tylenol. Denied photophobia. Recently seen by eye doctor 2-3 weeks prior and was prescribed contacts. Also reports impotence x 3-4 days. Hypertensive to 190/104 at [facility], sent home with plan for PCP follow up for hypertension. Presented to [facility] 6/23 with persistent HA, blurry vision and confusion for ~ 1 week with new onset of lightheadedness, SOB and CP. Also reported new onset of R arm weakness/paresthesias. Had acute onset of speech disturbance and gait changes 1 hour prior to presentation at [facility]. Hypertensive to 174/110. Neuro consulted and advised patient to present to ED for further workup.   Patient arrived to [facility] ED 6/23 and at that point had continued speech disturbance and blurry vision/unsteady gait. On ED arrival, hypertensive to 171/117, low grade temp 99.42F, HR 97, mildly disoriented. Labs were grossly unremarkable. CT Head 6/23 NAICA. MRI Brain 6/23 with multifocal cortical edema within both hemispheres, R > L, concerning for viral encephalitis. LP was completed in ED per Neuro recommendations and patient was started on empiric meningitis coverage (ceftriaxone, vanc, acyclovir). Meningitis/encephalitis panel including HSV 1/2 PCR, EBV, CMV and VDRL ordered as well as EEG. Patient admitted to [facility] service. CSF not consistent with infection, empiric steroids started for likely autoimmune/inflammatory process with EEG +seizure activity, Ativan and Keppra administered and LTM EEG started. ID was consulted with recommendation for discontinuation of antibiotics.   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 PCCM consulted for transfer to ICU, possible intubation for burst suppression to control status epilepticus and CVC placement for PLEX/IVIG.  Pertinent  Medical History  ADD/ Asthma  Significant Hospital Events: Including procedures, antibiotic start and stop dates in addition to other pertinent events   6/23 - Presented to ED from [facility] for persistent HA, blurry vision, hypertension, R-sided arm weakness/paresthesias. CT Head NAICA. MRI Brain with multifocal cortical edema within both hemispheres, R > L, concerning for viral encephalitis. Empiric meningitis coverage (CTX, vanc, acyclovir). LP not consistent with infection. 6/24 - LTM EEG with seizure activity. ID consulted, signed off after unlikely infectious etiology. 6/25 - PCCM consulted for ICU transfer, intubation for burst suppression, Trialysis catheter placement for PLEX/IVIG. 6/26 - LTM EEG/Prop for burst suppression, PLEX day 1 6/28 PICC Placed  Interim History / Subjective:  Tmax 101.42F overnight, noted to have copious diarrhea over weekend- has since resolved per nursing staff. Remains comatose on ventilator- pentobarb gtt turned up to 4mg /kr/hr on 7/1 with good effect, now EEG appears isoelectric. Leukocytosis mildly worsened overnight to 13.4  Objective   Blood pressure 121/75, pulse 93, temperature 100 F (37.8 C), resp. rate 18, height 5\' 11"  (1.803 m), weight 92.8 kg, SpO2 96 %.    Vent Mode: PRVC FiO2 (%):  [40 %] 40 % Set Rate:  [18 bmp] 18 bmp Vt Set:  [600 mL] 600 mL PEEP:  [5 cmH20] 5 cmH20 Plateau Pressure:  [14 cmH20-28 cmH20] 14 cmH20   Intake/Output Summary (Last 24 hours) at 11/26/2022 0811 Last data filed at 11/26/2022 0736 Gross per 24 hour  Intake 5679.76 ml  Output 3985 ml  Net 1694.76 ml    Filed Weights   11/25/22 1001 11/25/22 1116 11/26/22 0436  Weight: 93.5 kg 93.5 kg 92.8 kg    Examination:    Physical Exam Constitutional:      Comments: Comatose   HENT:     Head: Normocephalic.  Cardiovascular:     Rate and Rhythm: Normal rate and regular rhythm.     Pulses: Normal pulses.     Heart sounds: Normal heart sounds.  Pulmonary:     Comments: Synchronous on ventilator  Abdominal:     General: Abdomen is flat.     Palpations: Abdomen is soft.  Skin:    General: Skin is warm.  Neurological:     Comments: PERRLA- comatose on phenobarb, does not follow commands, no purposeful movement     Resolved Hospital Problem list   N/A  Assessment & Plan:  Acute, rapidly progressive encephalopathy, concerning for autoimmune encephalitis, viral less likely Status epilepticus - Empiric meningitis coverage discontinued 6/24 P:  - PLEX, imaging, and steroids per neurology -Increase pentobarb to 4mg /kg/hr on 7/1 to increase sedation- now isoelectric EEG waveform- will leave at current dose pending neurology evaluation -pt remains on keppra,  valproate, and perampanel   Acute hypoxemic respiratory failure in the setting of status epilepticus Possible aspiration pneumonia  Fevers (6/29) Not a candidate for extubation at this time given pentobarb coma, pending neuro evaluation. No plans for SBT currently - improved with foley removal; Pct neg, WBC stable at 10.9 on 7/1-- increased overnight to 13.4, remains with fevers up to 101.49F - Recheck CXR (-) - Tentative plan for trach with Dr. Merrily Pew on Friday 7/5 as patient will be in an induced coma/vent-dependent for quit a while; will need to discuss with family this week  Hypernatremia- (Improving) from diarrhea + lasix. Now improving with last serum sodium at 148, continue to monitor O2 needs up despite diuretic challenge - Continue FWF to q4hr; hold further bowel regimen due to diarrhea     ?Aspiration/TF intolerance- TF to goal, no issues despite question of TF from nose 6/29   Hypotension and Bradycardia, related to sedation -  resolved, dopamine available PRN   Sedation related hypotension- levophed as needed   History of ADD- Not on home medications  Best Practice (right click and "Reselect all SmartList Selections" daily)   Diet/type: tubefeeds DVT prophylaxis: SCD and lovenox GI prophylaxis: PPI Lines: Dialysis Catheter and pigtail for PLEX, PICC placed 6/28 Foley:  Yes, and it is still needed Code Status:  full code Last date of multidisciplinary goals of care discussion [11/24/2022]  Labs   CBC: Recent Labs  Lab 11/22/22 0500 11/23/22 0511 11/24/22 0500 11/25/22 0538 11/26/22 0731  WBC 12.9* 14.1* 11.6* 10.9* 13.4*  HGB 13.5 12.3* 13.0 12.9* 13.6  HCT 41.5 38.4* 41.6 40.9 42.0  MCV 80.7 85.0 84.9 84.5 82.4  PLT 284 241 213 182 170     Basic Metabolic Panel: Recent Labs  Lab 11/21/22 0415 11/21/22 1700 11/22/22 0500 11/23/22 0511 11/24/22 0500 11/25/22 0538  NA 142  --  150* 151* 158* 155*  K 5.2*  --  4.9 4.5 3.9 4.2  CL 106  --  113* 119* 113* 104  CO2 27  --  26 27 33* 36*  GLUCOSE 156*  --  163* 183* 174* 138*  BUN 28*  --  31* 37* 46* 39*  CREATININE 1.28*  --  1.21 1.26* 1.48* 1.10  CALCIUM 8.5*  --  8.6* 7.6* 8.6* 8.4*  MG 2.9* 2.8* 3.0* 2.9* 3.2* 3.4*  PHOS 4.9* 4.1 4.1 2.7 4.0 3.2    GFR: Estimated Creatinine Clearance: 124.7 mL/min (by C-G formula based on SCr of 1.1 mg/dL). Recent Labs  Lab 11/23/22 0511 11/24/22 0500 11/25/22 0538 11/26/22 0731  PROCALCITON <0.10  --   --   --   WBC 14.1* 11.6* 10.9* 13.4*     Liver Function Tests: No results for input(s): "AST", "ALT", "ALKPHOS", "BILITOT", "PROT", "ALBUMIN" in the last 168 hours. No results for input(s): "LIPASE", "AMYLASE" in the last 168 hours. No results for input(s): "AMMONIA" in the last 168 hours.  ABG    Component Value Date/Time   PHART 7.425 11/19/2022 1921   PCO2ART 39.1 11/19/2022 1921   PO2ART 328 (H) 11/19/2022 1921   HCO3 25.6 11/19/2022 1921   TCO2 27 11/19/2022 1921   O2SAT 100  11/19/2022 1921     Coagulation Profile: Recent Labs  Lab 11/20/22 0836 11/24/22 1003  INR 1.2 1.5*     Cardiac Enzymes: No results for input(s): "CKTOTAL", "CKMB", "CKMBINDEX", "TROPONINI" in the last 168 hours.  HbA1C: No results found for: "HGBA1C"  CBG: Recent Labs  Lab 11/25/22 1116 11/25/22 1525 11/25/22 1953 11/25/22 2342 11/26/22 0328  GLUCAP 131* 124* 139* 130* 143*     Review of Systems:   Negative except as listed in POC and HPI.  Past Medical History:  He,  has a past medical history of ADD (attention deficit disorder) and Asthma.   Surgical History:   Past Surgical History:  Procedure Laterality Date   CIRCUMCISION       Social History:   reports that he has never smoked. He has never used smokeless tobacco. He reports that he does not drink alcohol and does not use drugs.   Family History:  His family history includes Cirrhosis in his father; Heart failure in his paternal grandmother; Migraines in his brother; Stroke in his paternal grandfather.   Allergies Allergies  Allergen Reactions   Other     Seasonal Allergies     Home Medications  Prior to Admission medications   Not on File     Critical care time: 45 minutes    Janeece Riggers, AGACNP-BC [facility] For pager details, please see AMION or use EPIC chat After 1900, please call ELINK for cross coverage needs 11/26/2022 8:11 AM

## 2022-11-26 NOTE — Progress Notes (Signed)
Nutrition Follow-up  DOCUMENTATION CODES:   Not applicable  INTERVENTION:   Enteral nutrition via cortrak tube: Osmolite 1.5 @ 60 ml/hr (1440 ml/day) - PROSource TF20 60 ml BID  Tube feeding regimen at goal rate provides 2320 kcal, 130 grams of protein, and 1097 ml of H2O.  300 ml free water every 4 hours Total free water: 2897 ml   Recommend bowel regimen as needed since pt on pentobarbital   NUTRITION DIAGNOSIS:   Inadequate oral intake related to inability to eat as evidenced by NPO status. Ongoing.   GOAL:   Patient will meet greater than or equal to 90% of their needs Met at goal rate  MONITOR:   Vent status, Labs, Weight trends, TF tolerance  REASON FOR ASSESSMENT:   Ventilator, Consult Enteral/tube feeding initiation and management  ASSESSMENT:   22 year old male who presented to the ED on 6/23 with AMS, blurry vision, headache, nausea. PMH of asthma, ADD. Pt admitted with rapidly progressing acute encephalopathy, hypertensive urgency, seizures.  Pt discussed during ICU rounds and with RN. No family present at time of visit.  Burst suppression with pentobarbital  Plan for trach on 7/5.   06/25 - transfer to ICU, intubated, burst suppression, NPO 06/26 - starting on plasmapheresis, s/p cortrak placement; tip gastric    Medications reviewed and include: colace, SSI q 4 hours, IV protonix, miralax Pentobarbital  Keppra, valproate, perempanel   Labs reviewed: Na 147, BUN 46 CBG's: 111-143  UOP: 4010 ml x 24 hours   Diet Order:   Diet Order     None       EDUCATION NEEDS:   No education needs have been identified at this time  Skin:  Skin Assessment: Reviewed RN Assessment  Last BM:  100 ml FMS removed  Height:   Ht Readings from Last 1 Encounters:  11/19/22 5\' 11"  (1.803 m)    Weight:   Wt Readings from Last 1 Encounters:  11/26/22 92.8 kg    BMI:  Body mass index is 28.53 kg/m.  Estimated Nutritional Needs:   Kcal:   2300-2500  Protein:  125-140 grams  Fluid:  >2.2 L  Andric Kerce P., RD, LDN, CNSC See AMiON for contact information

## 2022-11-26 NOTE — Progress Notes (Signed)
Subjective: Continues to be on pentobarbital.  Febrile overnight with Tmax one 1.5.  Also had diarrhea.  ROS: Unable to obtain due to poor mental status  Examination  Vital signs in last 24 hours: Temp:  [99 F (37.2 C)-101.5 F (38.6 C)] 101.5 F (38.6 C) (07/02 1330) Pulse Rate:  [91-105] 99 (07/02 1330) Resp:  [18] 18 (07/02 1330) BP: (109-140)/(65-94) 110/67 (07/02 1330) SpO2:  [93 %-98 %] 95 % (07/02 1330) FiO2 (%):  [40 %] 40 % (07/02 1114) Weight:  [92.8 kg] 92.8 kg (07/02 0436)  General: lying in bed, intubated Neuro: Pupils appear to be about 3 mm, not reacting to light, corneal reflex absent, cough is excepting, does not withdraw to noxious stimuli in upper extremities  Basic Metabolic Panel: Recent Labs  Lab 11/21/22 1700 11/22/22 0500 11/23/22 0511 11/24/22 0500 11/25/22 0538 11/26/22 0731 11/26/22 1213  NA  --  150* 151* 158* 155* 148* 147*  K  --  4.9 4.5 3.9 4.2 4.0 4.0  CL  --  113* 119* 113* 104 104  --   CO2  --  26 27 33* 36* 33*  --   GLUCOSE  --  163* 183* 174* 138* 145*  --   BUN  --  31* 37* 46* 39* 46*  --   CREATININE  --  1.21 1.26* 1.48* 1.10 1.57*  --   CALCIUM  --  8.6* 7.6* 8.6* 8.4* 8.0*  --   MG 2.8* 3.0* 2.9* 3.2* 3.4* 3.3*  --   PHOS 4.1 4.1 2.7 4.0 3.2  --   --     CBC: Recent Labs  Lab 11/22/22 0500 11/23/22 0511 11/24/22 0500 11/25/22 0538 11/26/22 0731 11/26/22 1213  WBC 12.9* 14.1* 11.6* 10.9* 13.4*  --   HGB 13.5 12.3* 13.0 12.9* 13.6 13.9  HCT 41.5 38.4* 41.6 40.9 42.0 41.0  MCV 80.7 85.0 84.9 84.5 82.4  --   PLT 284 241 213 182 170  --      Coagulation Studies: Recent Labs    11/24/22 1003  LABPROT 18.2*  INR 1.5*    Imaging No new brain imaging overnight   ASSESSMENT AND PLAN:22 year old male with 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     New onset refractory status  epilepticus -No evidence of infection on lumbar puncture.  Autoimmune panel negative   Recommendations -Reduce pentobarbital to 3 and after an hour reduced to 2 mg/kg/hr. will then wait for few hours and depending on EEG, will continue to wean down to 1mg /kg/hour -Continue phenobarbital 97.5 mg twice daily, perampanel 8 mg daily, Keppra 1500 mg twice daily and Depakote 750 mg every 8 hours -Last round of Plex tomorrow - if EEG continues to show epileptic spikes/unable to wean off pentobarbital, will start tocilizumab after last round of thousand 26 tomorrow -MRI brain does show edema but no focal lesion that may be amenable to biopsy.  However will consider discussing with neurosurgery if unable to wean pentobarbital -Appreciate ICU teams care   CRITICAL CARE Performed by: Charlsie Quest     Total critical care time: 40 minutes   Critical care time was exclusive of separately billable procedures and treating other patients.   Critical care was necessary to treat or prevent imminent or life-threatening deterioration.   Critical care was time spent personally by me on the following activities: development of treatment plan with patient and/or  surrogate as well as nursing, discussions with consultants, evaluation of patient's response to treatment, examination of patient, obtaining history from patient or surrogate, ordering and performing treatments and interventions, ordering and review of laboratory studies, ordering and review of radiographic studies, pulse oximetry and re-evaluation of patient's condition.        Lindie Spruce Epilepsy [facility] For questions after 5pm please refer to AMION to reach the Neurologist on call

## 2022-11-26 NOTE — Progress Notes (Signed)
Critical care attending attestation note:   Patient seen and examined and relevant ancillary tests reviewed.  I agree with the assessment and plan of care as outlined by Janeece Riggers, NP.    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  CSF pathogen panel negative.  CSF traumatic tap. On examination patient sedated on pentobarbital infusion.  Chest clear to auscultation bilaterally. Still febrile Continuous EEG reviewed showing burst suppression.  GPD's have resolved.  Sodium has increased to 155.  Creatinine has improved to 1.1.  Procalcitonin is negative -Continue current anticonvulsant regimen. -Start to wean pentobarbital. Monitor for seizure recurrence.  -Complete course therapeutic plasma exchange -Consider tocilizumab as second immunosuppressive if continues to seize -Consider ketogenic diet.   CRITICAL CARE Performed by: Lynnell Catalan     Total critical care time: 35 minutes   Critical care time was exclusive of separately billable procedures and treating other patients.   Critical care was necessary to treat or prevent imminent or life-threatening deterioration.   Critical care was time spent personally by me on the following activities: development of treatment plan with patient and/or surrogate as well as nursing, discussions with consultants, evaluation of patient's response to treatment, examination of patient, obtaining history from patient or surrogate, ordering and performing treatments and interventions, ordering and review of laboratory studies, ordering and review of radiographic studies, pulse oximetry, re-evaluation of patient's condition and participation in multidisciplinary rounds.   Lynnell Catalan, MD [facility] ICU Physician [facility]  Pager: [phone] Mobile: [phone] After hours: [phone].

## 2022-11-26 NOTE — Procedures (Signed)
Patient Name: Colton Zhang  MRN: [id number]  Epilepsy Attending: Charlsie Quest  Referring Physician/Provider: Charlsie Quest, MD  Duration: 11/25/2022 1016 to 11/26/2022 1016   Patient history: 22 y.o. male with no significant PMH who presents with progressive confusion, lethargy, headache over the last week. MRI Brain notable for cortical edema within both hemispheres, mostly in right though. EEG to evaluate for seizure   Level of alertness: comatose   AEDs during EEG study: LEV, Phenobarb, Perampanel, Pentobarb   Technical aspects: This EEG study was done with scalp electrodes positioned according to the 10-20 International system of electrode placement. Electrical activity was reviewed with band pass filter of 1-70Hz , sensitivity of 7 uV/mm, display speed of 34mm/sec with a 60Hz  notched filter applied as appropriate. EEG data were recorded continuously and digitally stored.  Video monitoring was not available.   Description: EEG showed burst suppression pattern with bursts of sharply contoured 3-6hz  theta-delta slowing lasting 0.5-1 second alternating with generalized EEG suppression lasting 15-30 minutes. After around 2120 on 11/25/2022, no further bursts were noted and eeg showed generalized suppression.    ABNORMALITY - Burst suppression, generalized   IMPRESSION: This study is suggestive of profound diffuse encephalopathy consistent with pentobarb coma. No definite seizures were noted.    Jaysa Kise [name]

## 2022-11-26 NOTE — Progress Notes (Signed)
LTM maint complete - no skin breakdown under:  F7, A2, P8.

## 2022-11-26 NOTE — Progress Notes (Signed)
LTM maint complete - no skin breakdown under: Fp2 F3 F7 [facility] monitored, Event button test confirmed by [facility].  

## 2022-11-27 LAB — BASIC METABOLIC PANEL
Anion gap: 9 (ref 5–15)
BUN: 34 mg/dL — ABNORMAL HIGH (ref 6–20)
CO2: 34 mmol/L — ABNORMAL HIGH (ref 22–32)
Calcium: 8.3 mg/dL — ABNORMAL LOW (ref 8.9–10.3)
Chloride: 102 mmol/L (ref 98–111)
Creatinine, Ser: 1.25 mg/dL — ABNORMAL HIGH (ref 0.61–1.24)
GFR, Estimated: >60 mL/min (ref >60)
Glucose, Bld: 133 mg/dL — ABNORMAL HIGH (ref 70–99)
Potassium: 3.8 mmol/L (ref 3.5–5.1)
Sodium: 145 mmol/L (ref 135–145)

## 2022-11-27 LAB — GLUCOSE, CAPILLARY
Glucose-Capillary: 142 mg/dL — ABNORMAL HIGH (ref 70–99)
Glucose-Capillary: 142 mg/dL — ABNORMAL HIGH (ref 70–99)
Glucose-Capillary: 124 mg/dL — ABNORMAL HIGH (ref 70–99)
Glucose-Capillary: 123 mg/dL — ABNORMAL HIGH (ref 70–99)
Glucose-Capillary: 108 mg/dL — ABNORMAL HIGH (ref 70–99)

## 2022-11-27 LAB — CBC
HCT: 40.4 % (ref 39.0–52.0)
Hemoglobin: 13.1 g/dL (ref 13.0–17.0)
MCH: 26.8 pg (ref 26.0–34.0)
MCHC: 32.4 g/dL (ref 30.0–36.0)
MCV: 82.8 fL (ref 80.0–100.0)
Platelets: 185 10*3/uL (ref 150–400)
RBC: 4.88 MIL/uL (ref 4.22–5.81)
RDW: 14.2 % (ref 11.5–15.5)
WBC: 10.8 10*3/uL — ABNORMAL HIGH (ref 4.0–10.5)
nRBC: 0.2 % (ref 0.0–0.2)

## 2022-11-27 LAB — HEPATIC FUNCTION PANEL
ALT: 21 U/L (ref 0–44)
AST: 37 U/L (ref 15–41)
Albumin: 4 g/dL (ref 3.5–5.0)
Alkaline Phosphatase: 24 U/L — ABNORMAL LOW (ref 38–126)
Bilirubin, Direct: 0.2 mg/dL (ref 0.0–0.2)
Indirect Bilirubin: 0.5 mg/dL (ref 0.3–0.9)
Total Bilirubin: 0.7 mg/dL (ref 0.3–1.2)
Total Protein: 5.1 g/dL — ABNORMAL LOW (ref 6.5–8.1)

## 2022-11-27 LAB — HEPATITIS PANEL, ACUTE
HCV Ab: NONREACTIVE
Hep A IgM: NONREACTIVE
Hep B C IgM: NONREACTIVE
Hepatitis B Surface Ag: NONREACTIVE

## 2022-11-27 LAB — CULTURE, BLOOD (ROUTINE X 2): Culture: NO GROWTH

## 2022-11-27 LAB — MAGNESIUM: Magnesium: 2.8 mg/dL — ABNORMAL HIGH (ref 1.7–2.4)

## 2022-11-27 LAB — PHENOBARBITAL LEVEL: Phenobarbital: 26.3 ug/mL (ref 15.0–40.0)

## 2022-11-27 LAB — VALPROIC ACID LEVEL: Valproic Acid Lvl: 65 ug/mL (ref 50.0–100.0)

## 2022-11-27 MED ORDER — ACETAMINOPHEN 325 MG PO TABS: 650.0000 mg | ORAL_TABLET | ORAL | Status: DC | PRN

## 2022-11-27 MED ORDER — ACD FORMULA A 0.73-2.45-2.2 GM/100ML VI SOLN: 1000.0000 mL | Status: DC

## 2022-11-27 MED ORDER — EPINEPHRINE PF 1 MG/ML IJ SOLN: 0.3000 mg | INTRAMUSCULAR | Status: DC | PRN

## 2022-11-27 MED ORDER — CALCIUM CARBONATE ANTACID 500 MG PO CHEW
2.0000 | CHEWABLE_TABLET | ORAL | Status: AC
  Administered 2022-11-27 (×2): 400 mg via ORAL
  Filled 2022-11-27 (×2): qty 2

## 2022-11-27 MED ORDER — ORAL CARE MOUTH RINSE: 15.0000 mL | OROMUCOSAL | 0 refills | Status: DC

## 2022-11-27 MED ORDER — TOPIRAMATE 100 MG PO TABS: 100.0000 mg | ORAL_TABLET | Freq: Two times a day (BID) | ORAL | Status: DC

## 2022-11-27 MED ORDER — MAGNESIUM HYDROXIDE 400 MG/5ML PO SUSP: 30.0000 mL | Freq: Every day | ORAL | 0 refills | Status: DC | PRN

## 2022-11-27 MED ORDER — FAMOTIDINE IN NACL 20-0.9 MG/50ML-% IV SOLN: 20.0000 mg | Freq: Once | INTRAVENOUS | Status: DC | PRN

## 2022-11-27 MED ORDER — DIPHENHYDRAMINE HCL 50 MG/ML IJ SOLN
50.0000 mg | Freq: Once | INTRAMUSCULAR | Status: AC
  Administered 2022-11-27: 50 mg via INTRAVENOUS
  Filled 2022-11-27: qty 1

## 2022-11-27 MED ORDER — PERAMPANEL 8 MG PO TABS
4.0000 mg | ORAL_TABLET | Freq: Once | ORAL | Status: AC
  Administered 2022-11-27: 4 mg via ORAL
  Filled 2022-11-27: qty 1

## 2022-11-27 MED ORDER — ALBUTEROL SULFATE (2.5 MG/3ML) 0.083% IN NEBU: 2.5000 mg | INHALATION_SOLUTION | Freq: Once | RESPIRATORY_TRACT | 12 refills | Status: DC | PRN

## 2022-11-27 MED ORDER — PROSOURCE TF20 ENFIT COMPATIBL EN LIQD: 60.0000 mL | Freq: Four times a day (QID) | ENTERAL | Status: DC

## 2022-11-27 MED ORDER — PHENOBARBITAL SODIUM 130 MG/ML IJ SOLN: 97.5000 mg | Freq: Two times a day (BID) | INTRAMUSCULAR | Status: DC

## 2022-11-27 MED ORDER — DIPHENHYDRAMINE HCL 50 MG/ML IJ SOLN: 50.0000 mg | Freq: Once | INTRAMUSCULAR | 0 refills | Status: DC | PRN

## 2022-11-27 MED ORDER — POLYETHYLENE GLYCOL 3350 17 G PO PACK: 17.0000 g | PACK | Freq: Two times a day (BID) | ORAL | 0 refills | Status: DC

## 2022-11-27 MED ORDER — METHYLPREDNISOLONE SODIUM SUCC 125 MG IJ SOLR: 125.0000 mg | Freq: Once | INTRAMUSCULAR | Status: DC | PRN

## 2022-11-27 MED ORDER — ENOXAPARIN SODIUM 40 MG/0.4ML IJ SOSY: 40.0000 mg | PREFILLED_SYRINGE | Freq: Every day | INTRAMUSCULAR | Status: DC

## 2022-11-27 MED ORDER — DOCUSATE SODIUM 100 MG PO CAPS: 100.0000 mg | ORAL_CAPSULE | Freq: Two times a day (BID) | ORAL | 0 refills | Status: DC | PRN

## 2022-11-27 MED ORDER — TOPIRAMATE 25 MG PO TABS
100.0000 mg | ORAL_TABLET | Freq: Two times a day (BID) | ORAL | Status: DC
  Administered 2022-11-27 (×2): 100 mg via ORAL
  Filled 2022-11-27 (×2): qty 4

## 2022-11-27 MED ORDER — FREE WATER
300.0000 mL | Freq: Four times a day (QID) | Status: DC
  Administered 2022-11-27 (×2): 300 mL

## 2022-11-27 MED ORDER — POTASSIUM CHLORIDE 20 MEQ PO PACK
40.0000 meq | PACK | Freq: Once | ORAL | Status: AC
  Administered 2022-11-27: 40 meq
  Filled 2022-11-27: qty 2

## 2022-11-27 MED ORDER — ORAL CARE MOUTH RINSE: 15.0000 mL | OROMUCOSAL | 0 refills | Status: DC | PRN

## 2022-11-27 MED ORDER — PERAMPANEL 8 MG PO TABS: 12.0000 mg | ORAL_TABLET | Freq: Every day | ORAL | Status: DC

## 2022-11-27 MED ORDER — IBUPROFEN 100 MG/5ML PO SUSP: 200.0000 mg | Freq: Four times a day (QID) | ORAL | Status: DC | PRN

## 2022-11-27 MED ORDER — ACD FORMULA A 0.73-2.45-2.2 GM/100ML VI SOLN
1000.0000 mL | Status: DC
  Administered 2022-11-27: 1000 mL

## 2022-11-27 MED ORDER — DIPHENHYDRAMINE HCL 25 MG PO CAPS: 25.0000 mg | ORAL_CAPSULE | Freq: Four times a day (QID) | ORAL | Status: DC | PRN

## 2022-11-27 MED ORDER — VITAL AF 1.2 CAL PO LIQD
1000.0000 mL | ORAL | Status: DC
  Administered 2022-11-27: 1000 mL

## 2022-11-27 MED ORDER — LEVETIRACETAM IN NACL 1500 MG/100ML IV SOLN: 1500.0000 mg | Freq: Two times a day (BID) | INTRAVENOUS | Status: DC

## 2022-11-27 MED ORDER — HYDRALAZINE HCL 20 MG/ML IJ SOLN: 10.0000 mg | Freq: Four times a day (QID) | INTRAMUSCULAR | Status: DC | PRN

## 2022-11-27 MED ORDER — FENTANYL 2500MCG IN NS 250ML (10MCG/ML) PREMIX INFUSION: 50.0000 ug/h | INTRAVENOUS | 0 refills | Status: DC

## 2022-11-27 MED ORDER — SODIUM CHLORIDE 0.9 % IV SOLN
200.0000 mg | Freq: Two times a day (BID) | INTRAVENOUS | Status: DC
  Administered 2022-11-27: 200 mg via INTRAVENOUS
  Filled 2022-11-27 (×2): qty 20

## 2022-11-27 MED ORDER — VITAL AF 1.2 CAL PO LIQD: 1000.0000 mL | ORAL | Status: DC

## 2022-11-27 MED ORDER — DIPHENHYDRAMINE HCL 25 MG PO CAPS: 25.0000 mg | ORAL_CAPSULE | Freq: Four times a day (QID) | ORAL | 0 refills | Status: DC | PRN

## 2022-11-27 MED ORDER — INSULIN ASPART 100 UNIT/ML IJ SOLN: 0.0000 [IU] | INTRAMUSCULAR | 11 refills | Status: DC

## 2022-11-27 MED ORDER — ACETAMINOPHEN 325 MG PO TABS
650.0000 mg | ORAL_TABLET | Freq: Once | ORAL | Status: AC
  Administered 2022-11-27: 650 mg
  Filled 2022-11-27: qty 2

## 2022-11-27 MED ORDER — PROSOURCE TF20 ENFIT COMPATIBL EN LIQD
60.0000 mL | ENTERAL | Status: DC
  Administered 2022-11-27 (×3): 60 mL
  Filled 2022-11-27 (×3): qty 60

## 2022-11-27 MED ORDER — CALCIUM GLUCONATE-NACL 2-0.675 GM/100ML-% IV SOLN
2.0000 g | Freq: Once | INTRAVENOUS | Status: AC
  Administered 2022-11-27: 2000 mg via INTRAVENOUS
  Filled 2022-11-27 (×2): qty 100

## 2022-11-27 MED ORDER — ALBUTEROL SULFATE (2.5 MG/3ML) 0.083% IN NEBU: 2.5000 mg | INHALATION_SOLUTION | RESPIRATORY_TRACT | 12 refills | Status: DC | PRN

## 2022-11-27 MED ORDER — SENNOSIDES-DOCUSATE SODIUM 8.6-50 MG PO TABS: 2.0000 | ORAL_TABLET | Freq: Two times a day (BID) | ORAL | Status: DC

## 2022-11-27 MED ORDER — SODIUM CHLORIDE 0.9 % IV SOLN: 200.0000 mg | Freq: Two times a day (BID) | INTRAVENOUS | Status: DC

## 2022-11-27 MED ORDER — SODIUM CHLORIDE 0.9% FLUSH: 10.0000 mL | INTRAVENOUS | Status: DC | PRN

## 2022-11-27 MED ORDER — POLYETHYLENE GLYCOL 3350 17 G PO PACK
17.0000 g | PACK | Freq: Two times a day (BID) | ORAL | Status: DC
  Administered 2022-11-27: 17 g
  Filled 2022-11-27: qty 1

## 2022-11-27 MED ORDER — ACETAMINOPHEN 160 MG/5ML PO SOLN: 650.0000 mg | Freq: Four times a day (QID) | ORAL | 0 refills | Status: DC | PRN

## 2022-11-27 MED ORDER — PANTOPRAZOLE SODIUM 40 MG IV SOLR: 40.0000 mg | Freq: Every day | INTRAVENOUS | Status: DC

## 2022-11-27 MED ORDER — VALPROATE SODIUM 100 MG/ML IV SOLN: 1000.0000 mg | Freq: Three times a day (TID) | INTRAVENOUS | Status: DC

## 2022-11-27 MED ORDER — FENTANYL BOLUS VIA INFUSION: 50.0000 ug | INTRAVENOUS | 0 refills | Status: DC | PRN

## 2022-11-27 MED ORDER — HEPARIN SODIUM (PORCINE) 1000 UNIT/ML IJ SOLN
1000.0000 [IU] | Freq: Once | INTRAMUSCULAR | Status: AC
  Administered 2022-11-27: 1000 [IU]
  Filled 2022-11-27: qty 1

## 2022-11-27 MED ORDER — PENTOBARBITAL SODIUM 50 MG/ML IJ SOLN: 3.0000 mg/kg/h | INTRAVENOUS | 0 refills | Status: DC

## 2022-11-27 MED ORDER — SODIUM CHLORIDE 0.9 % IV SOLN: Freq: Once | INTRAVENOUS | Status: DC

## 2022-11-27 MED ORDER — SODIUM CHLORIDE 0.9 % IV BOLUS: 1000.0000 mL | Freq: Once | INTRAVENOUS | Status: DC | PRN

## 2022-11-27 MED ORDER — VALPROATE SODIUM 100 MG/ML IV SOLN
1000.0000 mg | Freq: Three times a day (TID) | INTRAVENOUS | Status: DC
  Administered 2022-11-27 (×2): 1000 mg via INTRAVENOUS
  Filled 2022-11-27 (×3): qty 10

## 2022-11-27 MED ORDER — FREE WATER: 300.0000 mL | Freq: Four times a day (QID) | Status: DC

## 2022-11-27 MED ORDER — PROSOURCE TF20 ENFIT COMPATIBL EN LIQD: 60.0000 mL | ENTERAL | Status: DC

## 2022-11-27 MED ORDER — ONDANSETRON HCL 4 MG PO TABS: 4.0000 mg | ORAL_TABLET | Freq: Four times a day (QID) | ORAL | 0 refills | Status: DC | PRN

## 2022-11-27 MED ORDER — SODIUM CHLORIDE 0.9 % IV SOLN
800.0000 mg | Freq: Once | INTRAVENOUS | Status: AC
  Administered 2022-11-27: 800 mg via INTRAVENOUS
  Filled 2022-11-27 (×2): qty 80

## 2022-11-27 MED ORDER — SODIUM CHLORIDE 0.9 % IV SOLN
INTRAVENOUS | Status: AC
  Filled 2022-11-27 (×3): qty 200

## 2022-11-27 MED ORDER — ALBUTEROL SULFATE (2.5 MG/3ML) 0.083% IN NEBU: 2.5000 mg | INHALATION_SOLUTION | Freq: Once | RESPIRATORY_TRACT | Status: DC | PRN

## 2022-11-27 MED ORDER — SENNOSIDES-DOCUSATE SODIUM 8.6-50 MG PO TABS
2.0000 | ORAL_TABLET | Freq: Two times a day (BID) | ORAL | Status: DC
  Administered 2022-11-27 (×2): 2
  Filled 2022-11-27 (×2): qty 2

## 2022-11-27 MED ORDER — METHYLPREDNISOLONE SODIUM SUCC 125 MG IJ SOLR: 125.0000 mg | Freq: Once | INTRAMUSCULAR | 0 refills | Status: DC | PRN

## 2022-11-27 MED ORDER — SODIUM CHLORIDE 0.9% FLUSH: 10.0000 mL | Freq: Two times a day (BID) | INTRAVENOUS | Status: DC

## 2022-11-27 MED ORDER — LABETALOL HCL 5 MG/ML IV SOLN: 20.0000 mg | INTRAVENOUS | Status: DC | PRN

## 2022-11-27 MED ORDER — CHLORHEXIDINE GLUCONATE CLOTH 2 % EX PADS: 6.0000 | MEDICATED_PAD | Freq: Every day | CUTANEOUS | Status: DC

## 2022-11-27 MED ORDER — SODIUM CHLORIDE 0.9 % IV SOLN
800.0000 mg | Freq: Once | INTRAVENOUS | Status: DC
  Filled 2022-11-27: qty 80

## 2022-11-27 MED ORDER — DIPHENHYDRAMINE HCL 50 MG/ML IJ SOLN: 50.0000 mg | Freq: Once | INTRAMUSCULAR | Status: DC | PRN

## 2022-11-27 NOTE — Progress Notes (Signed)
eLink Physician-Brief Progress Note Patient Name: Colton Zhang DOB: 12/28/2000 MRN: [id number]   Date of Service  11/27/2022  HPI/Events of Note  Patient with temperature spike up to 102.5 and tachycardia into the the 114 - 120 range after starting Rituxan, blood pressure unchanged, concern is for evolving reaction to the infusion, patient is due for transfer to [facility] hospital this evening.  eICU Interventions  Infusion paused pending transfer, PRN Tylenol  / Ibuprofen for fever.        Migdalia Dk 11/27/2022, 7:43 PM

## 2022-11-27 NOTE — Progress Notes (Signed)
LTM EEG discontinued - no skin breakdown at [facility]. [facility] notified.  No visible skin breakdown.

## 2022-11-27 NOTE — Progress Notes (Signed)
NAME:  Colton Zhang, MRN:  [id number], DOB:  [birth year], LOS: 9 ADMISSION DATE:  11/17/2022, CONSULTATION DATE:  11/19/2022 REFERRING MD:  [name]- Neuro, CHIEF COMPLAINT:  AMS, HTN   History of Present Illness:  22 year old man who presented to [facility] 6/23 with HA and blurry vision. PMHx significant for asthma, ADD.    Recently presented to urgent care 6/21 for R-sided headache, blurry vision and nausea x 4 days. HA improved with Tylenol. Denied photophobia. Recently seen by eye doctor 2-3 weeks prior and was prescribed contacts. Also reports impotence x 3-4 days. Hypertensive to 190/104 at [facility], sent home with plan for PCP follow up for hypertension. Presented to [facility] 6/23 with persistent HA, blurry vision and confusion for ~ 1 week with new onset of lightheadedness, SOB and CP. Also reported new onset of R arm weakness/paresthesias. Had acute onset of speech disturbance and gait changes 1 hour prior to presentation at [facility]. Hypertensive to 174/110. Neuro consulted and advised patient to present to ED for further workup.   Patient arrived to [facility] ED 6/23 and at that point had continued speech disturbance and blurry vision/unsteady gait. On ED arrival, hypertensive to 171/117, low grade temp 99.31F, HR 97, mildly disoriented. Labs were grossly unremarkable. CT Head 6/23 NAICA. MRI Brain 6/23 with multifocal cortical edema within both hemispheres, R > L, concerning for viral encephalitis. LP was completed in ED per Neuro recommendations and patient was started on empiric meningitis coverage (ceftriaxone, vanc, acyclovir). Meningitis/encephalitis panel including HSV 1/2 PCR, EBV, CMV and VDRL ordered as well as EEG. Patient admitted to [facility] service. CSF not consistent with infection, empiric steroids started for likely autoimmune/inflammatory process with EEG +seizure activity, Ativan and Keppra administered and LTM EEG started. ID was consulted with recommendation for discontinuation of antibiotics.   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 PCCM consulted for transfer to ICU, possible intubation for burst suppression to control status epilepticus and CVC placement for PLEX/IVIG.  Pertinent  Medical History  ADD/ Asthma  Significant Hospital Events: Including procedures, antibiotic start and stop dates in addition to other pertinent events   6/23 - Presented to ED from [facility] for persistent HA, blurry vision, hypertension, R-sided arm weakness/paresthesias. CT Head NAICA. MRI Brain with multifocal cortical edema within both hemispheres, R > L, concerning for viral encephalitis. Empiric meningitis coverage (CTX, vanc, acyclovir). LP not consistent with infection. 6/24 - LTM EEG with seizure activity. ID consulted, signed off after unlikely infectious etiology. 6/25 - PCCM consulted for ICU transfer, intubation for burst suppression, Trialysis catheter placement for PLEX/IVIG. 6/26 - LTM EEG/Prop for burst suppression, PLEX day 1 6/28 PICC Placed 7/3- pentobarb turned down to 2mg /kg/hr, awaiting neurology recommendations for further adjustment, EEG remains suppressed, pt stuporous not following commands   Interim History / Subjective:  Tmax 100.60F overnight. Remains comatose on ventilator- pentobarb gtt turned down to 1mg /kg/hr per neurology recommendations.   Objective   Blood pressure (!) 141/80, pulse 97, temperature 99.7 F (37.6 C), temperature source Esophageal, resp. rate 18, height 5\' 11"  (1.803 m), weight 92 kg, SpO2 97 %.    Vent Mode: PRVC FiO2 (%):  [40 %] 40 % Set Rate:  [18 bmp] 18 bmp Vt Set:  [600 mL] 600 mL PEEP:  [5 cmH20] 5 cmH20 Plateau Pressure:  [16 cmH20-19 cmH20] 17 cmH20   Intake/Output Summary (Last 24 hours) at 11/27/2022 0844 Last data filed at 11/27/2022 0800 Gross per 24 hour  Intake 4041.8  ml  Output 6025 ml  Net -1983.2 ml    Filed Weights   11/25/22 1116 11/26/22 0436 11/27/22 0500  Weight:  93.5 kg 92.8 kg 92 kg    Examination:   Physical Exam Constitutional:      Comments: Comatose   HENT:     Head: Normocephalic.  Cardiovascular:     Rate and Rhythm: Normal rate and regular rhythm.     Pulses: Normal pulses.     Heart sounds: Normal heart sounds.  Pulmonary:     Comments: Synchronous on ventilator  Abdominal:     General: Abdomen is flat.     Palpations: Abdomen is soft.  Skin:    General: Skin is warm.  Neurological:     Comments: PERRLA- comatose on phenobarb, does not follow commands, no purposeful movement     Resolved Hospital Problem list   History of ADD- Not on home medications  ?Aspiration/TF intolerance- TF to goal, no issues despite question of TF from nose 6/29   Hypotension and Bradycardia, related to sedation - resolved, dopamine available PRN   Sedation related hypotension- levophed as needed- has not been needed in last 48 hours.    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 Last round of plasmapheresis today 7/3 P:  - PLEX, imaging, and steroids per neurology -Pentobarb titrated down to 1mg /kg/hr on 7/3 to- further adjustments per neurology team -pt remains on keppra,  valproate, and perampanel   Acute hypoxemic respiratory failure in the setting of status epilepticus Possible aspiration pneumonia  Fevers (6/29) Not a candidate for extubation at this time given pentobarb coma and neuro status. No plans for SBT currently. Pct neg, WBC stable at 10.8 on 7/3- remains with fevers up to 100.88F Recheck CXR (-) -Maintain full vent support with SAT/SBT as tolerated -titrate Vent setting to maintain SpO2 greater than or equal to 90%. -HOB elevated 30 degrees. -Plateau pressures less than 30 cm H20.  -Follow chest x-ray, ABG prn.   -Bronchial hygiene and RT/bronchodilator protocol.     Hypernatremia- (Resolved) from diarrhea + lasix. Now improving with last serum sodium at 145, continue to monitor O2 needs up despite diuretic challenge -Adjusted FWF to q6hr- continue to trend chemistry    Best Practice (right click and "Reselect all SmartList Selections" daily)   Diet/type: tubefeeds DVT prophylaxis: SCD and lovenox GI prophylaxis: PPI Lines: Dialysis Catheter and pigtail for PLEX, PICC placed 6/28 Foley:  Yes, and it is still needed Code Status:  full code Last date of multidisciplinary goals of care discussion [11/24/2022]  Labs   CBC: Recent Labs  Lab 11/23/22 0511 11/24/22 0500 11/25/22 0538 11/26/22 0731 11/26/22 1213 11/27/22 0552  WBC 14.1* 11.6* 10.9* 13.4*  --  10.8*  HGB 12.3* 13.0 12.9* 13.6 13.9 13.1  HCT 38.4* 41.6 40.9 42.0 41.0 40.4  MCV 85.0 84.9 84.5 82.4  --  82.8  PLT 241 213 182 170  --  185     Basic Metabolic Panel: Recent Labs  Lab 11/21/22 1700 11/22/22 0500 11/23/22 0511 11/24/22 0500 11/25/22 0538 11/26/22 0731 11/26/22 1213 11/27/22 0552  NA  --  150* 151* 158* 155* 148* 147* 145  K  --  4.9 4.5 3.9 4.2 4.0 4.0 3.8  CL  --  113* 119* 113* 104 104  --  102  CO2  --  26 27 33* 36* 33*  --  34*  GLUCOSE  --  163* 183* 174* 138* 145*  --  133*  BUN  --  31* 37* 46* 39* 46*  --  34*  CREATININE  --  1.21 1.26* 1.48* 1.10 1.57*  --  1.25*  CALCIUM  --  8.6* 7.6* 8.6* 8.4* 8.0*  --  8.3*  MG 2.8* 3.0* 2.9* 3.2* 3.4* 3.3*  --  2.8*  PHOS 4.1 4.1 2.7 4.0 3.2  --   --   --     GFR: Estimated Creatinine Clearance: 109.3 mL/min (A) (by C-G formula based on SCr of 1.25 mg/dL (H)). Recent Labs  Lab 11/23/22 0511 11/24/22 0500 11/25/22 0538 11/26/22 0731 11/27/22 0552  PROCALCITON <0.10  --   --   --   --   WBC 14.1* 11.6* 10.9* 13.4* 10.8*     Liver Function Tests: No results for input(s): "AST", "ALT", "ALKPHOS", "BILITOT", "PROT", "ALBUMIN" in the last 168 hours. No results for input(s): "LIPASE",  "AMYLASE" in the last 168 hours. No results for input(s): "AMMONIA" in the last 168 hours.  ABG    Component Value Date/Time   PHART 7.513 (H) 11/26/2022 1213   PCO2ART 47.1 11/26/2022 1213   PO2ART 141 (H) 11/26/2022 1213   HCO3 37.5 (H) 11/26/2022 1213   TCO2 39 (H) 11/26/2022 1213   O2SAT 99 11/26/2022 1213     Coagulation Profile: Recent Labs  Lab 11/24/22 1003  INR 1.5*     Cardiac Enzymes: No results for input(s): "CKTOTAL", "CKMB", "CKMBINDEX", "TROPONINI" in the last 168 hours.  HbA1C: No results found for: "HGBA1C"  CBG: Recent Labs  Lab 11/26/22 1601 11/26/22 1942 11/26/22 2342 11/27/22 0340 11/27/22 0724  GLUCAP 141* 123* 125* 142* 142*     Review of Systems:   Negative except as listed in POC and HPI.  Past Medical History:  He,  has a past medical history of ADD (attention deficit disorder) and Asthma.   Surgical History:   Past Surgical History:  Procedure Laterality Date   CIRCUMCISION       Social History:   reports that he has never smoked. He has never used smokeless tobacco. He reports that he does not drink alcohol and does not use drugs.   Family History:  His family history includes Cirrhosis in his father; Heart failure in his paternal grandmother; Migraines in his brother; Stroke in his paternal grandfather.   Allergies Allergies  Allergen Reactions   Other     Seasonal Allergies     Home Medications  Prior to Admission medications   Not on File     Critical care time: 38 minutes    Janeece Riggers, AGACNP-BC  Pulmonary & Critical Care Medicine For pager details, please see AMION or use EPIC chat After 1900, please call ELINK for cross coverage needs 11/27/2022 8:44 AM

## 2022-11-27 NOTE — Progress Notes (Signed)
Upon coming on shift, patient's HR trend has changed from low 90s when the Rituximab infusion started, to 112-114 now. Tmax was 99.9-100.0 to 102.5 now and has trended up since beginning the infusion. Dr. Warrick Parisian called to update him on the patient's condition. Dr. Warrick Parisian instructed me to stop the infusion and I did such.

## 2022-11-27 NOTE — Progress Notes (Signed)
   11/27/22 1129  Vitals  BP 132/80  Apheresis   Procedure Comments Tx done.  Post-apheresis  Net Removed (mL) 3936 mL  Replacement (mL) 3050 mL  ACDA infused (mL) 744 mL  Total Normal Saline Administered 0 mL  Total Calcium Administered 200 mL (4g)  Tolerated Procedure Yes  Post-Procedure Comments Pt completed TPE tx without issue.  Hemodialysis Catheter Right Internal jugular Triple lumen Temporary (Non-Tunneled)  Placement Date/Time: 11/19/22 1830   Time Out: Correct patient;Correct procedure;Correct site  Maximum sterile barrier precautions: Hand hygiene;Cap;Mask;Sterile gown;Sterile gloves;Large sterile sheet  Site Prep: Chlorhexidine (preferred)  Local Anes...  Site Condition No complications  Blue Lumen Status Heparin locked  Red Lumen Status Heparin locked  Purple Lumen Status N/A  Catheter fill solution Heparin 1000 units/ml  Catheter fill volume (Arterial) 1.2 cc  Catheter fill volume (Venous) 1.2  Dressing Type Transparent  Dressing Status Antimicrobial disc in place  Interventions New dressing  Drainage Description None  Post treatment catheter status Capped and Clamped

## 2022-11-27 NOTE — Progress Notes (Signed)
Subjective: Attempted to wean pentobarb to 1mg /kg/hr. Showed GPEDs with overriding fast activity at 1-1.5hz . Also febrile, Tmax 100.58F.   ROS: Unable to obtain due to poor mental status  Examination  Vital signs in last 24 hours: Temp:  [97.9 F (36.6 C)-100.8 F (38.2 C)] 100.8 F (38.2 C) (07/03 1400) Pulse Rate:  [84-99] 99 (07/03 1400) Resp:  [17-21] 18 (07/03 1400) BP: (107-145)/(66-88) 124/77 (07/03 1400) SpO2:  [88 %-100 %] 96 % (07/03 1400) FiO2 (%):  [40 %] 40 % (07/03 1140) Weight:  [92 kg] 92 kg (07/03 0947)  General: lying in bed, intubated Neuro: on pentobarb @3  mg/kg/hr, Pupils appear to be about 3 mm, not reacting to light, corneal reflex absent, cough is excepting, does not withdraw to noxious stimuli in upper extremities    Basic Metabolic Panel: Recent Labs  Lab 11/21/22 1700 11/22/22 0500 11/23/22 0511 11/24/22 0500 11/25/22 0538 11/26/22 0731 11/26/22 1213 11/27/22 0552  NA  --  150* 151* 158* 155* 148* 147* 145  K  --  4.9 4.5 3.9 4.2 4.0 4.0 3.8  CL  --  113* 119* 113* 104 104  --  102  CO2  --  26 27 33* 36* 33*  --  34*  GLUCOSE  --  163* 183* 174* 138* 145*  --  133*  BUN  --  31* 37* 46* 39* 46*  --  34*  CREATININE  --  1.21 1.26* 1.48* 1.10 1.57*  --  1.25*  CALCIUM  --  8.6* 7.6* 8.6* 8.4* 8.0*  --  8.3*  MG 2.8* 3.0* 2.9* 3.2* 3.4* 3.3*  --  2.8*  PHOS 4.1 4.1 2.7 4.0 3.2  --   --   --     CBC: Recent Labs  Lab 11/23/22 0511 11/24/22 0500 11/25/22 0538 11/26/22 0731 11/26/22 1213 11/27/22 0552  WBC 14.1* 11.6* 10.9* 13.4*  --  10.8*  HGB 12.3* 13.0 12.9* 13.6 13.9 13.1  HCT 38.4* 41.6 40.9 42.0 41.0 40.4  MCV 85.0 84.9 84.5 82.4  --  82.8  PLT 241 213 182 170  --  185     Coagulation Studies: No results for input(s): "LABPROT", "INR" in the last 72 hours.  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     New onset refractory status epilepticus -No evidence of infection on lumbar puncture.  Autoimmune panel negative   Recommendations -Attempted to wean pentobarb to 1 but has worsening GPEDs with overriding fast activity at 1-1.5hz . Therefore, went back up on pentobarbital to 3 mg/kg/hour - Swicth keppra to briviact 200mg  BID - Increase vpa to 1000mg  Q8h - Increase permapnsle to 12mg  daily -  Seizures didn't improve with vimpat ( used on arrival) - Continue phenobarbital 97.5 mg twice daily - Last round of Plex 5/5 today.  - Discussed with Dr Domenic Schwab at [facility]. Accepted for transfer ( on waitlist currently). Discussed using tocilizumab vs rituximab as next agent. Recommended rituximab. - Discussed with pharmacy and started Rituximab 375mg /m2 q week for 4 weeks -MRI brain does show edema but no focal lesion that may be amenable to biopsy.  However might consider discussing with neurosurgery if unable to wean pentobarbital -Appreciate ICU teams and pharmacy assistance - Family updated by ICU team   CRITICAL CARE Performed by: Charlsie Quest  Total critical care time: 48 minutes   Critical care time was exclusive of separately billable procedures and treating other patients.   Critical care was necessary to treat or prevent imminent or life-threatening deterioration.   Critical care was time spent personally by me on the following activities: development of treatment plan with patient and/or surrogate as well as nursing, discussions with consultants, evaluation of patient's response to treatment, examination of patient, obtaining history from patient or surrogate, ordering and performing treatments and interventions, ordering and review of laboratory studies, ordering and review of radiographic studies, pulse oximetry and re-evaluation of patient's condition.          Lindie Spruce Epilepsy [facility] For questions after 5pm please refer to AMION to reach the Neurologist on call

## 2022-11-27 NOTE — Progress Notes (Signed)
LTM maint complete - no skin breakdown under:  FP1,FP2  

## 2022-11-27 NOTE — Progress Notes (Signed)
Nutrition Follow-up  DOCUMENTATION CODES:   Not applicable  INTERVENTION:   Continue enteral nutrition via Cortrak tube: - Change to Vital AF 1.2 @ 20 ml/hr (480 ml/day) - PROSource TF20 60 ml q 4 hours - Free water flushes per CCM, currently 300 ml q 6 hours  Tube feeding regimen provides 1056 kcal, 156 grams of protein, and 389 ml of H2O (meets 46% of minimum kcal needs and >100% of protein needs).  Total free water with flushes: 1589 ml  - Recommend bowel regimen as needed since pt on pentobarbital   - Consider addition of MCT oil per tube  NUTRITION DIAGNOSIS:   Inadequate oral intake related to inability to eat as evidenced by NPO status.  Ongoing  GOAL:   Patient will meet greater than or equal to 90% of their needs  Unmet at this time  MONITOR:   Vent status, Labs, Weight trends, TF tolerance  REASON FOR ASSESSMENT:   Ventilator, Consult Enteral/tube feeding initiation and management  ASSESSMENT:   22 year old male who presented to the ED on 6/23 with AMS, blurry vision, headache, nausea. PMH of asthma, ADD. Pt admitted with rapidly progressing acute encephalopathy, hypertensive urgency, seizures.  06/25 - transfer to ICU, intubated, burst suppression, NPO 06/26 - starting on plasmapheresis, s/p cortrak placement (tip gastric)  Pt discussed with CCM MD and NP. No family present at time of visit. Pt remains on burst suppression with pentobarbital.  CCM MD requesting ketogenic tube feeding formula which is not on formulary. CCM MD considering discontinuing tube feeds and starting lipid infusion. RD recommended changing tube feeding formula to lowest-carbohydrate option currently on formulary and decreasing to trickle rate while increasing provision of protein modulars.  Pt well-nourished on admission and has been receiving tube feeds at goal rate since 0940 on 6/27. Adjusted tube feeding regimen will meet pt's estimated protein needs but will only provide  ~50% of estimated kcal needs. Can consider adding MCT oil per tube for additional kcal and lipids.  Per discussion with CCM NP, pt may transfer to [facility]. Noted plan for trach on 7/05.  Admit weight: 90.7 kg Current weight: 92 kg  Patient remains intubated on ventilator support Temp (24hrs), Avg:99.9 F (37.7 C), Min:97.9 F (36.6 C), Max:101.5 F (38.6 C)  Drips: Pentobarbital: 2 mg/kg/hr  Medications reviewed and include: colace, SSI q 4 hours, IV protonix, miralax, perampanel, IV phenobarbital, IV keppra, IV valproate  Labs reviewed: BUN 34, creatinine 1.25, magnesium 2.8, WBC 10.8 CBG's: 123-142 x 24 hours  UOP: 5950 ml x 24 hours I/O's: +5.7 L since admit  Diet Order:   Diet Order     None       EDUCATION NEEDS:   No education needs have been identified at this time  Skin:  Skin Assessment: Reviewed RN Assessment  Last BM:  11/25/22  Height:   Ht Readings from Last 1 Encounters:  11/19/22 5\' 11"  (1.803 m)    Weight:   Wt Readings from Last 1 Encounters:  11/27/22 92 kg    BMI:  Body mass index is 28.29 kg/m.  Estimated Nutritional Needs:   Kcal:  2300-2500  Protein:  125-140 grams  Fluid:  >2.2 L   Mertie Clause, MS, RD, LDN Inpatient Clinical Dietitian Please see AMiON for contact information.

## 2022-11-27 NOTE — Discharge Summary (Signed)
Physician Discharge Summary  Patient ID: Colton Zhang MRN: [id number] DOB/AGE: 01/12/2001 22 y.o.  Admit date: 11/17/2022 Discharge date: 11/27/2022  Admission Diagnoses: status epilepticus  Discharge Diagnoses: new onset refractory status epilepticus.  Principal Problem:   Viral encephalitis Active Problems:   Asthma, chronic   Hypertensive urgency   Acute hypoxic respiratory failure (HCC)   Status epilepticus [facility])   Discharged Condition: critical  Hospital Course: 22 year old man who presented to [facility] 6/23 with HA and blurry vision. PMHx significant for asthma, ADD.  No prior seizure history.   Several days of hypertension prior to admission. Presented with slurred speech, gait disturbance, blurred vision. Hypertensive to 190 CT Head 6/23 NAICA. MRI Brain 6/23 with multifocal cortical edema within both hemispheres, R > L, concerning for viral encephalitis. LP was completed in ED per Neuro recommendations and patient was started on empiric meningitis coverage (ceftriaxone, vanc, acyclovir). Meningitis/encephalitis panel including HSV 1/2 PCR, EBV, CMV and VDRL ordered as well as EEG. CSF not consistent with infection, empiric steroids started for likely autoimmune/inflammatory process with EEG +seizure activity, Ativan and Keppra administered and LTM EEG started. ID was consulted with recommendation for discontinuation of antibiotics.   Intubated and sedation titrated to burst suppression with propofol and versed. Control achieved but would develop recurrent seizure activity on sedation weaning. AED's escalated but epileptiform discharges continued, so switched to pentobarbital. In the interim, Patient received high dose steroid pulse and 7 days of PLEX. Attempted to wean pentobarbital but  Frequent epileptiform discharges on EEG. Given dose of rituxan and transfer arranged to [facility].  Medically has not required pressors. Normal renal function. Has developed enlarged tongue. Has folliculitis  on legs.   Consults: pulmonary/intensive care and neurology  Significant Diagnostic Studies:   7/3 - EEG showed burst suppression pattern with bursts of sharply contoured 3-6hz  theta-delta slowing lasting 0.5-2 second alternating with generalized EEG suppression lasting 20 seconds to 1 minute. Gradually after around 2300 on 11/26/2022 , inter burst interval ( IBI) decreased to 3-10 seconds. Additionally, generalized spikes were noted with bursts.  6/30 - MRI Persistent multifocal T2 hyperintensity/edema involving the right greater than left cerebral hemispheres and left cerebellum with overall very mild mixed interval changes as above and no completely new lesion. Again, this could reflect encephalitis.  Treatments: PLEX daily - final session 7/3. Pentobarbital coma   Discharge Exam: Blood pressure 134/78, pulse 96, temperature 100.2 F (37.9 C), temperature source Esophageal, resp. rate 18, height 5\' 11"  (1.803 m), weight 92 kg, SpO2 97 %.  Physical Exam Constitutional:      Comments: Comatose   HENT:     Head: Normocephalic.  Cardiovascular:     Rate and Rhythm: Normal rate and regular rhythm.     Pulses: Normal pulses.     Heart sounds: Normal heart sounds.  Pulmonary:     Comments: Synchronous on ventilator  Abdominal:     General: Abdomen is flat.     Palpations: Abdomen is soft.  Skin:    General: Skin is warm.  Neurological:     Comments: PERRLA- comatose on phenobarb, does not follow commands, no purposeful movement   Disposition:    Allergies as of 11/27/2022       Reactions   Other    Seasonal Allergies        Medication List     TAKE these medications    acetaminophen 160 MG/5ML solution Commonly known as: TYLENOL Place 20.3 mLs (650 mg total) into feeding tube every 6 (  six) hours as needed for fever.   acetaminophen 325 MG tablet Commonly known as: TYLENOL Take 2 tablets (650 mg total) by mouth every 4 (four) hours as needed (discomfort or fever).    albuterol (2.5 MG/3ML) 0.083% nebulizer solution Commonly known as: PROVENTIL Take 3 mLs (2.5 mg total) by nebulization every 4 (four) hours as needed for wheezing.   albuterol (2.5 MG/3ML) 0.083% nebulizer solution Commonly known as: PROVENTIL Take 3 mLs (2.5 mg total) by nebulization once as needed for wheezing or shortness of breath (or anaphylaxis due to Rituxan infusion).   brivaracetam 200 mg in sodium chloride 0.9 % 50 mL Inject 200 mg into the vein every 12 (twelve) hours.   Chlorhexidine Gluconate Cloth 2 % Pads Apply 6 each topically daily.   citrate dextrose 0.73-2.45-2.2 GM/100ML solution 1,000 mLs by Other route continuous.   diphenhydrAMINE 25 mg capsule Commonly known as: BENADRYL Take 1 capsule (25 mg total) by mouth every 6 (six) hours as needed for itching ( or hives).   diphenhydrAMINE 50 MG/ML injection Commonly known as: BENADRYL Inject 1 mL (50 mg total) into the vein once as needed (for cytokine release/hypersensitivity reactions OR anaphylaxis due to Rituxan infusion).   docusate sodium 100 MG capsule Commonly known as: COLACE Take 1 capsule (100 mg total) by mouth 2 (two) times daily as needed for mild constipation.   enoxaparin 40 MG/0.4ML injection Commonly known as: LOVENOX Inject 0.4 mLs (40 mg total) into the skin daily. Start taking on: November 28, 2022   EPINEPHrine 1 MG/ML Soln Commonly known as: ADRENALIN Inject 0.3 mLs (0.3 mg total) into the muscle every 5 (five) minutes as needed (anaphylactic reaction due to Rituxan infusion).   famotidine 20-0.9 MG/50ML-% Commonly known as: PEPCID Inject 50 mLs (20 mg total) into the vein once as needed (for cytokine release/hypersensitivity reactions OR anaphylaxis due to Rituxan infusion).   feeding supplement (PROSource TF20) liquid Place 60 mLs into feeding tube every 4 (four) hours.   feeding supplement (VITAL AF 1.2 CAL) Liqd Place 1,000 mLs into feeding tube continuous.   fentaNYL 10 mcg/ml  Soln infusion Inject 50-200 mcg/hr into the vein continuous.   fentaNYL Soln Commonly known as: SUBLIMAZE Inject 50-100 mcg into the vein every 15 (fifteen) minutes as needed (to maintain RASS & CPOT goal.).   free water Soln Place 300 mLs into feeding tube every 6 (six) hours.   hydrALAZINE 20 MG/ML injection Commonly known as: APRESOLINE Inject 0.5 mLs (10 mg total) into the vein every 6 (six) hours as needed (For systolic blood pressure more than 160 or diastolic blood pressure more than 100).   ibuprofen 100 MG/5ML suspension Commonly known as: ADVIL Place 10 mLs (200 mg total) into feeding tube every 6 (six) hours as needed for fever.   insulin aspart 100 UNIT/ML injection Commonly known as: novoLOG Inject 0-9 Units into the skin every 4 (four) hours.   labetalol 5 MG/ML injection Commonly known as: NORMODYNE Inject 4 mLs (20 mg total) into the vein every 3 (three) hours as needed (SBP more than 160 or DBP more than 100).   levETIRacetam 1500 MG/100ML Soln Commonly known as: KEPPRA Inject 100 mLs (1,500 mg total) into the vein every 12 (twelve) hours.   magnesium hydroxide 400 MG/5ML suspension Commonly known as: MILK OF MAGNESIA Take 30 mLs by mouth daily as needed for mild constipation.   methylPREDNISolone sodium succinate 125 mg/2 mL injection Commonly known as: SOLU-MEDROL Inject 2 mLs (125 mg total) into the  vein once as needed (for cytokine release/hypersensitivity reactions OR anaphylaxis due to Rituxan infusion).   mouth rinse Liqd solution 15 mLs by Mouth Rinse route every 2 (two) hours.   mouth rinse Liqd solution 15 mLs by Mouth Rinse route as needed (oral care).   ondansetron 4 MG tablet Commonly known as: ZOFRAN Take 1 tablet (4 mg total) by mouth every 6 (six) hours as needed for nausea.   pantoprazole 40 MG injection Commonly known as: PROTONIX Inject 40 mg into the vein at bedtime.   PENTobarbital 2,500 mg in dextrose 5 % 250 mL Inject 276  mg/hr into the vein continuous.   perampanel 8 MG tablet Commonly known as: FYCOMPA Place 1.5 tablets (12 mg total) into feeding tube daily. Start taking on: November 28, 2022   PHENObarbital 130 MG/ML injection Commonly known as: LUMINAL Inject 0.75 mLs (97.5 mg total) into the vein 2 (two) times daily.   polyethylene glycol 17 g packet Commonly known as: MIRALAX / GLYCOLAX Place 17 g into feeding tube 2 (two) times daily.   senna-docusate 8.6-50 MG tablet Commonly known as: Senokot-S Place 2 tablets into feeding tube 2 (two) times daily.   sodium chloride 0.9 % Inject 1,000 mLs into the vein once as needed (for hypotension or anaphylactic reaction due to Rituxan infusion).   sodium chloride flush 0.9 % Soln Commonly known as: NS 10-40 mLs by Intracatheter route every 12 (twelve) hours.   sodium chloride flush 0.9 % Soln Commonly known as: NS 10-40 mLs by Intracatheter route as needed (flush).   topiramate 100 MG tablet Commonly known as: TOPAMAX Take 1 tablet (100 mg total) by mouth 2 (two) times daily.   valproate 1,000 mg in dextrose 5 % 50 mL Inject 1,000 mg into the vein every 8 (eight) hours.         SignedLynnell Catalan 11/27/2022, 4:58 PM

## 2022-11-27 NOTE — Progress Notes (Signed)
Report given to Market researcher  at [facility]

## 2022-11-27 NOTE — Procedures (Addendum)
Patient Name: Colton Zhang  MRN: [id number]  Epilepsy Attending: Charlsie Quest  Referring Physician/Provider: Charlsie Quest, MD  Duration: 11/26/2022 1016 to 11/27/2022  1016   Patient history: 22 y.o. male with no significant PMH who presents with progressive confusion, lethargy, headache over the last week. MRI Brain notable for cortical edema within both hemispheres, mostly in right though. EEG to evaluate for seizure   Level of alertness: comatose   AEDs during EEG study: LEV, Phenobarb, Perampanel, Pentobarb   Technical aspects: This EEG study was done with scalp electrodes positioned according to the 10-20 International system of electrode placement. Electrical activity was reviewed with band pass filter of 1-70Hz , sensitivity of 7 uV/mm, display speed of 49mm/sec with a 60Hz  notched filter applied as appropriate. EEG data were recorded continuously and digitally stored.  Video monitoring was not available.   Description: EEG showed burst suppression pattern with bursts of sharply contoured 3-6hz  theta-delta slowing lasting 0.5-2 second alternating with generalized EEG suppression lasting 20 seconds to 1 minute. Gradually after around 2300 on 11/26/2022 , inter burst interval ( IBI) decreased to 3-10 seconds. Additionally, generalized spikes were noted with bursts.   ABNORMALITY - Spikes, generalized - Burst suppression, generalized   IMPRESSION: This study  showed evidence of epileptogenicity with generalized onset as well as profound diffuse encephalopathy consistent with pentobarb coma. No definite seizures were noted.    Abygail Galeno [name]

## 2022-11-28 LAB — CULTURE, BLOOD (ROUTINE X 2): Special Requests: ADEQUATE

## 2022-11-28 NOTE — Procedures (Addendum)
Patient Name: Colton Zhang  MRN: [id number]  Epilepsy Attending: Charlsie Quest  Referring Physician/Provider: Charlsie Quest, MD  Duration: 11/27/2022 1016 to 11/27/2022  1706   Patient history: 22 y.o. male with no significant PMH who presents with progressive confusion, lethargy, headache over the last week. MRI Brain notable for cortical edema within both hemispheres, mostly in right though. EEG to evaluate for seizure   Level of alertness: comatose   AEDs during EEG study: LEV, VPA, Phenobarb, Perampanel, Pentobarb   Technical aspects: This EEG study was done with scalp electrodes positioned according to the 10-20 International system of electrode placement. Electrical activity was reviewed with band pass filter of 1-70Hz , sensitivity of 7 uV/mm, display speed of 76mm/sec with a 60Hz  notched filter applied as appropriate. EEG data were recorded continuously and digitally stored.  Video monitoring was not available.   Description: At the beginning of the study, EEG showed burst suppression pattern with bursts of sharply contoured 3-6hz  theta-delta slowing lasting 0.5-2 second alternating with generalized EEG suppression lasting 3-10 seconds. Additionally, generalized spikes were noted with bursts. Gradually, as pentobarbital was weaned, EEG showed generalized periodic epileptiform discharges with overriding  fast activity at 1-1.5hz  admixed with 1-2 seconds of generalized suppression. Pentobarbital was increased again and subsequently after around 1500 on 11/27/2022, EEG showed  burst suppression pattern with bursts of sharply contoured 3-6hz  theta-delta slowing admixed with generalized spikes lasting 0.5-5 second alternating with generalized EEG suppression lasting 3-10seconds.    ABNORMALITY - Periodic epileptiform discharges with overriding fast activity, generalized  - Burst suppression, generalized   IMPRESSION: At the beginning of the study, EEG showed evidence of epileptogenicity with  generalized onset.  As pentobarbital was weaned, study gradually worsened and showed generalized periodic epileptiform discharges with overriding fast activity.  This EEG pattern was on the ictal-interictal continuum with increased risk of seizures. Pentobarbital was increased and subsequently after around 1500 on 11/27/2022, EEG was again suggestive of epileptogenicity with generalized onset as well as profound diffuse encephalopathy consistent with pentobarbital coma.    Lusine Corlett [name]

## 2023-02-17 ENCOUNTER — Encounter: Payer: Self-pay | Admitting: Occupational Therapy

## 2023-02-17 NOTE — Therapy (Signed)
OUTPATIENT OCCUPATIONAL THERAPY NEURO EVALUATION  Patient Name: Colton Zhang MRN: [id number] DOB:01-Sep-2000, 22 y.o., male Today's Date: 02/18/2023  PCP: none REFERRING PROVIDER: Murriel Hopper FNP  END OF SESSION:  OT End of Session - 02/18/23 1404     Visit Number 1    Number of Visits 17    Date for OT Re-Evaluation 05/13/23    Authorization Type [employer]    Authorization Time Period 12 weeks    Authorization - Visit Number 1    Authorization - Number of Visits 17    OT Start Time 0841    OT Stop Time 0925    OT Time Calculation (min) 44 min    Behavior During Therapy [facility] for tasks assessed/performed             Past Medical History:  Diagnosis Date   ADD (attention deficit disorder)    Asthma    Past Surgical History:  Procedure Laterality Date   CIRCUMCISION     Patient Active Problem List   Diagnosis Date Noted   Acute hypoxic respiratory failure (HCC) 11/25/2022   Status epilepticus (HCC) 11/25/2022   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   Asthma, chronic 11/18/2022   Hypertensive urgency 11/18/2022   Headache(784.0) 09/16/2013   Migraine without aura 09/16/2013   ADD (attention deficit disorder) 09/16/2013   Transient alteration of awareness 09/16/2013    ONSET DATE: 01/30/23 referral date  REFERRING DIAG:  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  Z74.09 (ICD-10-CM) - Other reduced mobility  Z78.9 (ICD-10-CM) - Other specified health status    THERAPY DIAG:  Muscle weakness (generalized) - Plan: Ot plan of care cert/re-cert  Other abnormalities of gait and mobility - Plan: Ot plan of care cert/re-cert  Unsteadiness on feet - Plan: Ot plan of care cert/re-cert  Frontal lobe and executive function deficit - Plan: Ot plan of care cert/re-cert  Attention and concentration deficit - Plan: Ot plan of care cert/re-cert  Rationale for Evaluation and Treatment: Rehabilitation  SUBJECTIVE:   SUBJECTIVE STATEMENT: Denies pain Pt accompanied by:  self  PERTINENT HISTORY: Per chart review:22 year old man who presented to [facility] 6/23 with HA and blurry vision. PMHx significant for asthma, ADD, DVT No prior seizure history. Several days of hypertension prior to admission. Presented with slurred speech, gait disturbance, blurred vision. Hypertensive to 190 . MRI Brain 6/23 with multifocal cortical edema within both hemispheres, R > L, concerning for viral encephalitis.  patient was started on empiric meningitis coverage (ceftriaxone, vanc, acyclovir). 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 He had prolonged intubation followed by tracheostomy, stayed on vent for over a month. His tracheostomy was reversed last month. He still has a feeding tube, but plan is to follow with surgeon for removal. Pt received therapies at [facility]. PRECAUTIONS: Other: seizures , DVT, peg tube  WEIGHT BEARING RESTRICTIONS: No  PAIN:  Are you having pain? No  FALLS: Has patient fallen in last 6 months? No  LIVING ENVIRONMENT: Lives with: lives with their family Lives in: House/apartment Stairs: Yes: External: Has following equipment at home: Walker - 2 wheeled  PLOF: Independent  PATIENT GOALS: increase arm strength  OBJECTIVE:   HAND DOMINANCE: Left  ADLs: Overall ADLs: increased time due to balance and endurance Transfers/ambulation related to ADLs: Eating: independent Grooming: independent UB Dressing: mod I LB Dressing: mod I Toileting: mod I without assistive device, only uses walker in public Bathing: sponge bath mod I Tub Shower transfers: not performing due to peg, has  shower chair  Equipment: Shower seat with back  IADLs: Shopping: dependent for transportation Light housekeeping: has performed light home management Meal Prep: performs snack prep only has not attempted cooking Community mobility: mod I with walker Medication management: brother loads pillbox, and then pt takes them Financial  management: family is assisting currently Handwriting: 100% legible  MOBILITY STATUS:  mod I    ACTIVITY TOLERANCE: Activity tolerance: 10-15 mins standing, prior to rest break  Posture- Rounded shoulders with scapular winging UPPER EXTREMITY ROM:  remaining A/ROM [facility]  Active ROM Right eval Left eval  Shoulder flexion 110 105  Shoulder abduction 70 70  Shoulder adduction    Shoulder extension    Shoulder internal rotation    Shoulder external rotation    Elbow flexion    Elbow extension    Wrist flexion    Wrist extension    Wrist ulnar deviation    Wrist radial deviation    Wrist pronation    Wrist supination    (Blank rows = not tested)  UPPER EXTREMITY MMT:     MMT Right eval Left eval  Shoulder flexion 3+/5 3+/5  Shoulder abduction    Shoulder adduction    Shoulder extension    Shoulder internal rotation    Shoulder external rotation    Middle trapezius    Lower trapezius    Elbow flexion 4/5 4-/5  Elbow extension 4/5 4-/5  Wrist flexion    Wrist extension    Wrist ulnar deviation    Wrist radial deviation    Wrist pronation    Wrist supination    (Blank rows = not tested)  HAND FUNCTION: Grip strength: Right: 55 lbs; Left: 85 lbs  COORDINATION: 9 Hole Peg test: Right: 25.59 sec; Left: 21.99 sec  SENSATION: WFL   COGNITION: Overall cognitive status:  to be further assessed in a functional context, pt recalled 1/3 items following a short delay. For spelling World backwards pt omitted R  VISION:   VISION ASSESSMENT: Not tested      OBSERVATIONS: Pleasant young man agreeable to therapy   TODAY'S TREATMENT:                                                                                                                              DATE: 02/18/23 - evaluation completed,  Pt performed supine scapular retraction, chest press and shoulder flexion with cane, min v.c and demonstration for shoulder and scapular positioning, seated low range  chest press- mirror used to educated pt in positioning.   PATIENT EDUCATION: Education details: role of OT, potential goals Person educated: Patient Education method: Explanation Education comprehension: verbalized understanding  HOME EXERCISE PROGRAM: N/A   GOALS: Goals reviewed with patient? Yes  SHORT TERM GOALS: Target date: 03/19/23  Pt will be I with Initial HEP Baseline: Goal status: INITIAL  2.  Pt will retrieve a lightweight object at 110 shoulder flexion with LUE Baseline: 105* Goal status: INITIAL  3.  Pt will retrieve a lightweight object at 115 shoulder flexion with RUE Baseline: 110* Goal status: INITIAL  4.  Pt will increase RUE grip strength by 5 lbs for increased RUE functional use. Baseline:  RUE 55 lbs, LUE 85 lbs Goal status: INITIAL  5.  I with memory/ cognitive compensation strategies.  Goal status: INITIAL  6.  Pt will perform basic stovetop cooking with supervision.  Goal status: INITIAL  LONG TERM GOALS: Target date: 05/13/23  I with updated HEP  Goal status: INITIAL  2.  Pt will demonstrate increased activity tolerance as evidenced by performing functional activities in standing x 25 mins without rest or LOB.  Goal status: INITIAL  3.  Pt will retrieve a 5 lbs object at 120 shoulder flexion with LUE with good control Baseline: 105* Goal status: INITIAL   4.   Pt will retrieve a 5 lbs object at 120 shoulder flexion with RUE with good control Baseline: 110* Goal status: INITIAL  5.  Pt will perform basic stovetop cooking/ mod complex home management withmod I  Goal status: INITIAL  6.  Pt will perform simulated work activities modified independently without LOB  Goal status: INITIAL  ASSESSMENT:  CLINICAL IMPRESSION: Patient is a 22 y.o. male who was seen today for occupational therapy evaluation for Critical illness myopathy.Pt was hospitalized 11/17/22 with HA and blurry vision. PMHx significant for asthma, ADD, DVT No  prior seizure history. Several days of hypertension prior to admission. Presented with slurred speech, gait disturbance, blurred vision. Hypertensive to 190 . MRI Brain 6/23/ 24 with multifocal cortical edema within both hemispheres, R > L, concerning for viral encephalitis.  patient was started on empiric meningitis coverage (ceftriaxone, vanc, acyclovir). 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 He had prolonged intubation followed by tracheostomy, stayed on vent for over a month. His tracheostomy was reversed last month. He still has a feeding tube, but plan is to follow with surgeon for removal. Pt presents with the following deficits: decreased strength, decreased ROM, decreased balance, cognitive deficits, decreased activity tolerance which impedes performance of ADLs/IADLs. Pt can benefit from skilled OT to address these deficits in order to maximize pt's safety and I with daily activities.  PERFORMANCE DEFICITS: in functional skills including ADLs, IADLs, coordination, dexterity, proprioception, sensation, ROM, strength, flexibility, Fine motor control, Gross motor control, mobility, balance, endurance, decreased knowledge of precautions, decreased knowledge of use of DME, and UE functional use, cognitive skills including memory and thought, and psychosocial skills including coping strategies, environmental adaptation, habits, interpersonal interactions, and routines and behaviors.   IMPAIRMENTS: are limiting patient from ADLs, IADLs, work, play, leisure, and social participation.   CO-MORBIDITIES: may have co-morbidities  that affects occupational performance. Patient will benefit from skilled OT to address above impairments and improve overall function.  MODIFICATION OR ASSISTANCE TO COMPLETE EVALUATION: No modification of tasks or assist necessary to complete an evaluation.  OT OCCUPATIONAL PROFILE AND HISTORY: Detailed assessment: Review of  records and additional review of physical, cognitive, psychosocial history related to current functional performance.  CLINICAL DECISION MAKING: LOW - limited treatment options, no task modification necessary  REHAB POTENTIAL: Good  EVALUATION COMPLEXITY: Low    PLAN:  OT FREQUENCY:  16 visits pus eval  OT DURATION: 12 weeks  PLANNED INTERVENTIONS: self care/ADL training, therapeutic exercise, therapeutic activity, neuromuscular re-education, manual therapy, passive range of motion, ultrasound, moist heat, cryotherapy, contrast bath, patient/family education, cognitive remediation/compensation, energy conservation, coping strategies training, DME and/or  AE instructions, and Re-evaluation  RECOMMENDED OTHER SERVICES: PT  CONSULTED AND AGREED WITH PLAN OF CARE: Patient  PLAN FOR NEXT SESSION: issue cane exercises, consider yellow band for biceps and triceps.   Darnelle Derrick, OT 02/18/2023, 3:52 PM

## 2023-02-18 ENCOUNTER — Ambulatory Visit: Payer: BC Managed Care – PPO | Admitting: Physical Therapy

## 2023-02-18 ENCOUNTER — Encounter: Payer: Self-pay | Admitting: Physical Therapy

## 2023-02-18 ENCOUNTER — Ambulatory Visit: Payer: BC Managed Care – PPO | Attending: Nurse Practitioner | Admitting: Occupational Therapy

## 2023-02-18 DIAGNOSIS — R2681 Unsteadiness on feet: Secondary | ICD-10-CM

## 2023-02-18 DIAGNOSIS — R2689 Other abnormalities of gait and mobility: Secondary | ICD-10-CM | POA: Insufficient documentation

## 2023-02-18 DIAGNOSIS — R41844 Frontal lobe and executive function deficit: Secondary | ICD-10-CM | POA: Insufficient documentation

## 2023-02-18 DIAGNOSIS — M6281 Muscle weakness (generalized): Secondary | ICD-10-CM

## 2023-02-18 DIAGNOSIS — R41840 Attention and concentration deficit: Secondary | ICD-10-CM | POA: Insufficient documentation

## 2023-02-18 NOTE — Therapy (Signed)
OUTPATIENT PHYSICAL THERAPY LOWER EXTREMITY EVALUATION   Patient Name: Colton Zhang MRN: [id number] DOB:04-Mar-2001, 22 y.o., male Today's Date: 02/18/2023  END OF SESSION:  PT End of Session - 02/18/23 0802     Visit Number 1    Date for PT Re-Evaluation 05/20/23    PT Start Time 0758    PT Stop Time 0840    PT Time Calculation (min) 42 min    Activity Tolerance Patient tolerated treatment well    Behavior During Therapy [facility] for tasks assessed/performed             Past Medical History:  Diagnosis Date   ADD (attention deficit disorder)    Asthma    Past Surgical History:  Procedure Laterality Date   CIRCUMCISION     Patient Active Problem List   Diagnosis Date Noted   Acute hypoxic respiratory failure (HCC) 11/25/2022   Status epilepticus (HCC) 11/25/2022   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   Asthma, chronic 11/18/2022   Hypertensive urgency 11/18/2022   Headache(784.0) 09/16/2013   Migraine without aura 09/16/2013   ADD (attention deficit disorder) 09/16/2013   Transient alteration of awareness 09/16/2013    PCP: None  REFERRING PROVIDER: Lavone Nian, FNP  REFERRING DIAG: difficulty walking and weakness  THERAPY DIAG:  Muscle weakness (generalized)  Unsteady gait  Rationale for Evaluation and Treatment: Rehabilitation  ONSET DATE: 11/21/22  SUBJECTIVE:   SUBJECTIVE STATEMENT: Patient reports that he was hospitalized for about 2 months, having seizures, reports medical induced coma, lost 50#, has a feeding tube.  Was in a rehab center, reports that he is doing very well with his walking  PERTINENT HISTORY: HA, ADD, encephalitis. Epilepticus, asthma PAIN:  Are you having pain? Feeding tube site pain  PRECAUTIONS: Fall  RED FLAGS: None   WEIGHT BEARING RESTRICTIONS: No  FALLS:  Has patient fallen in last 6 months? 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#  PLOF: Independent and no issues  PATIENT GOALS: be normal like before  NEXT MD VISIT: none   OBJECTIVE:   DIAGNOSTIC FINDINGS: Encephalitis  COGNITION: Overall cognitive status: Within functional limits for tasks assessed    SENSATION: WFL   POSTURE: rounded shoulders and forward head  LOWER EXTREMITY ROM:  WFL's  LOWER EXTREMITY MMT:  MMT Right eval Left eval  Hip flexion 4- 4-  Hip extension 4- 4-  Hip abduction 4 4  Hip adduction    Hip internal rotation    Hip external rotation    Knee flexion 4 4  Knee extension 4- 4-  Ankle dorsiflexion 4- 4-  Ankle plantarflexion 4- 4-  Ankle inversion 4- 4-  Ankle eversion 4- 4-   (Blank rows = not tested)  FUNCTIONAL TESTS:  5 times sit to stand: 18 seconds, but with difficulty, had some compensation in the hips Timed up and go (TUG): 11 seconds no device  3 minute walk test: not tested Berg Balance Scale: 54/56 Dynamic Gait Index: 18  GAIT: Distance walked: 100' Assistive device utilized: None Level of assistance: Modified independence Comments: at times some weaving  at times, stairs independent   TODAY'S TREATMENT:  DATE:     PATIENT EDUCATION:  Education details: HEP/POC Person educated: Patient Education method: Medical illustrator Education comprehension: verbalized understanding  HOME EXERCISE PROGRAM: Standing hip abduction and extension, marching  ASSESSMENT:  CLINICAL IMPRESSION: Patient is a 22 y.o. male who was seen today for physical therapy evaluation and treatment for weakness and difficulty walking.  He was in the hospital for 2 months, lost 50#, is on a feeding tube.  Was using a walker until about two weeks  ago, no is walking without it but reports that he uses it when out in the community or for long distances.  With functional testing he did very well, biggest issue was with weakness of the LE's hips being the most affected..   OBJECTIVE IMPAIRMENTS: Abnormal gait, cardiopulmonary status limiting activity, decreased activity tolerance, decreased balance, decreased coordination, decreased endurance, decreased mobility, difficulty walking, decreased ROM, decreased strength, impaired flexibility, improper body mechanics, postural dysfunction, and pain.   REHAB POTENTIAL: Good  CLINICAL DECISION MAKING: Stable/uncomplicated  EVALUATION COMPLEXITY: Low   GOALS: Goals reviewed with patient? Yes  SHORT TERM GOALS: Target date: 02/28/23 Independent with initial HEP Goal status: INITIAL  LONG TERM GOALS: Target date: 05/20/23  Independent with advanced HEP Goal status: INITIAL  2.  Decrease TUG time to 7 seconds Goal status: INITIAL  3.  5XSTS 12 seconds without compensation Goal status: INITIAL  4.  Walk 1000+ feet in the community without difficulty Goal status: INITIAL  PLAN:  PT FREQUENCY: 1x/week  PT DURATION: 8 weeks  PLANNED INTERVENTIONS: Therapeutic exercises, Therapeutic activity, Neuromuscular re-education, Balance training, Gait training, Patient/Family education, Self Care, Joint mobilization, Stair training, and Manual therapy  PLAN FOR NEXT SESSION: advance gym activities to build strength, endurance and function   Zameria Vogl W, PT 02/18/2023, 8:02 AM

## 2023-02-20 ENCOUNTER — Ambulatory Visit: Payer: BC Managed Care – PPO | Admitting: Occupational Therapy

## 2023-02-20 DIAGNOSIS — R41840 Attention and concentration deficit: Secondary | ICD-10-CM

## 2023-02-20 DIAGNOSIS — R2689 Other abnormalities of gait and mobility: Secondary | ICD-10-CM

## 2023-02-20 DIAGNOSIS — R2681 Unsteadiness on feet: Secondary | ICD-10-CM

## 2023-02-20 DIAGNOSIS — R41844 Frontal lobe and executive function deficit: Secondary | ICD-10-CM

## 2023-02-20 DIAGNOSIS — M6281 Muscle weakness (generalized): Secondary | ICD-10-CM | POA: Diagnosis not present

## 2023-02-20 NOTE — Therapy (Signed)
OUTPATIENT OCCUPATIONAL THERAPY NEURO treatment  Patient Name: Colton Zhang MRN: [id number] DOB:[birth year], 22 y.o., male Today's Date: 02/20/2023  PCP: none REFERRING PROVIDER: Murriel Hopper FNP  END OF SESSION:  OT End of Session - 02/20/23 1013     Visit Number 2    Number of Visits 17    Date for OT Re-Evaluation 05/13/23    Authorization Type BCBS    Authorization Time Period 12 weeks    Authorization - Number of Visits 17    OT Start Time 0933    OT Stop Time 1012    OT Time Calculation (min) 39 min    Behavior During Therapy [facility] for tasks assessed/performed              Past Medical History:  Diagnosis Date   ADD (attention deficit disorder)    Asthma    Past Surgical History:  Procedure Laterality Date   CIRCUMCISION     Patient Active Problem List   Diagnosis Date Noted   Acute hypoxic respiratory failure (HCC) 11/25/2022   Status epilepticus (HCC) 11/25/2022   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   Asthma, chronic 11/18/2022   Hypertensive urgency 11/18/2022   Headache(784.0) 09/16/2013   Migraine without aura 09/16/2013   ADD (attention deficit disorder) 09/16/2013   Transient alteration of awareness 09/16/2013    ONSET DATE: 01/30/23 referral date  REFERRING DIAG:  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 PMHx significant for asthma, ADD, DVT No prior seizure history. Several days of hypertension prior to  admission. Presented with slurred speech, gait disturbance, blurred vision. Hypertensive to 190 . MRI Brain 6/23 with multifocal cortical edema within both hemispheres, R > L, concerning for viral encephalitis.  patient was started on empiric meningitis coverage (ceftriaxone, vanc, acyclovir). 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 He had prolonged intubation followed by tracheostomy, stayed on vent for over a month. His tracheostomy was reversed last month. He still has a feeding tube, but plan is to follow with surgeon for removal. Pt received therapies at [facility]. PRECAUTIONS: Other: seizures , DVT, peg tube  WEIGHT BEARING RESTRICTIONS: No  PAIN:  Are you having pain? No  FALLS: Has patient fallen in last 6 months? No  LIVING ENVIRONMENT: Lives with: lives with their family Lives in: House/apartment Stairs: Yes: External: Has following equipment at home: Environmental consultant - 2 wheeled  PLOF: Independent  PATIENT GOALS: increase arm strength  OBJECTIVE:   HAND DOMINANCE: Left  ADLs: Overall ADLs: increased time due to balance and endurance Transfers/ambulation related to ADLs: Eating: independent Grooming: independent UB Dressing: mod I LB Dressing: mod I Toileting: mod I without assistive device, only uses walker in public Bathing: sponge bath mod I Tub Shower transfers: not performing due to peg, has shower chair  Equipment: Shower seat with back  IADLs: Shopping: dependent for transportation Light housekeeping: has performed light home management Meal Prep: performs snack prep only has not attempted cooking Community mobility: mod I with walker Medication management: brother loads pillbox,  and then pt takes them Financial management: family is assisting currently Handwriting: 100% legible  MOBILITY STATUS:  mod I    ACTIVITY TOLERANCE: Activity tolerance: 10-15 mins standing, prior to rest break  Posture- Rounded shoulders  with scapular winging UPPER EXTREMITY ROM:  remaining A/ROM [facility]  Active ROM Right eval Left eval  Shoulder flexion 110 105  Shoulder abduction 70 70  Shoulder adduction    Shoulder extension    Shoulder internal rotation    Shoulder external rotation    Elbow flexion    Elbow extension    Wrist flexion    Wrist extension    Wrist ulnar deviation    Wrist radial deviation    Wrist pronation    Wrist supination    (Blank rows = not tested)  UPPER EXTREMITY MMT:     MMT Right eval Left eval  Shoulder flexion 3+/5 3+/5  Shoulder abduction    Shoulder adduction    Shoulder extension    Shoulder internal rotation    Shoulder external rotation    Middle trapezius    Lower trapezius    Elbow flexion 4/5 4-/5  Elbow extension 4/5 4-/5  Wrist flexion    Wrist extension    Wrist ulnar deviation    Wrist radial deviation    Wrist pronation    Wrist supination    (Blank rows = not tested)  HAND FUNCTION: Grip strength: Right: 55 lbs; Left: 85 lbs  COORDINATION: 9 Hole Peg test: Right: 25.59 sec; Left: 21.99 sec  SENSATION: WFL   COGNITION: Overall cognitive status:  to be further assessed in a functional context, pt recalled 1/3 items following a short delay. For spelling World backwards pt omitted R  VISION:   VISION ASSESSMENT: Not tested      OBSERVATIONS: Pleasant young man agreeable to therapy   TODAY'S TREATMENT:                                                                                                                              DATE: 02/20/23- Supine-  exercises for scapular retraction,then chest press and shoulder flexion with cane min v.c . Pt was instructed in HEP, for UE strength and scapular stability,  yellow band issued for the theraband exercises. see pt instructions- 10 reps each exercise and min v.c (Pt practiced getting up and down from floor safely mod I) Arm bike x 6 mins level 1 for conditioning  02/18/23 - evaluation  completed,  Pt performed supine scapular retraction, chest press and shoulder flexion with cane, min v.c and demonstration for shoulder and scapular positioning, seated low range chest press- mirror used to educated pt in positioning.   PATIENT EDUCATION: Education details: HEP- see pt instructions Person educated: Patient Education method: Explanation, demonstration, handout Education comprehension: verbalized understanding, returned demonstration  HOME EXERCISE PROGRAM:  02/20/23- scapular stability / UE strength   GOALS: Goals reviewed with patient? Yes  SHORT TERM GOALS: Target date:  03/19/23  Pt will be I with Initial HEP Baseline: Goal status:  issued, ongoing 02/20/23  2.  Pt will retrieve a lightweight object at 110 shoulder flexion with LUE Baseline: 105* Goal status: INITIAL  3.  Pt will retrieve a lightweight object at 115 shoulder flexion with RUE Baseline: 110* Goal status: INITIAL  4.  Pt will increase RUE grip strength by 5 lbs for increased RUE functional use. Baseline:  RUE 55 lbs, LUE 85 lbs Goal status: INITIAL  5.  I with memory/ cognitive compensation strategies.  Goal status: INITIAL  6.  Pt will perform basic stovetop cooking with supervision.  Goal status: INITIAL  LONG TERM GOALS: Target date: 05/13/23  I with updated HEP  Goal status: INITIAL  2.  Pt will demonstrate increased activity tolerance as evidenced by performing functional activities in standing x 25 mins without rest or LOB.  Goal status: INITIAL  3.  Pt will retrieve a 5 lbs object at 120 shoulder flexion with LUE with good control Baseline: 105* Goal status: INITIAL   4.   Pt will retrieve a 5 lbs object at 120 shoulder flexion with RUE with good control Baseline: 110* Goal status: INITIAL  5.  Pt will perform basic stovetop cooking/ mod complex home management withmod I  Goal status: INITIAL  6.  Pt will perform simulated work activities modified independently  without LOB  Goal status: INITIAL  ASSESSMENT:  CLINICAL IMPRESSION: Pt is progressing towards goals. He demonstrates understanding of initial HEP.  PERFORMANCE DEFICITS: in functional skills including ADLs, IADLs, coordination, dexterity, proprioception, sensation, ROM, strength, flexibility, Fine motor control, Gross motor control, mobility, balance, endurance, decreased knowledge of precautions, decreased knowledge of use of DME, and UE functional use, cognitive skills including memory and thought, and psychosocial skills including coping strategies, environmental adaptation, habits, interpersonal interactions, and routines and behaviors.   IMPAIRMENTS: are limiting patient from ADLs, IADLs, work, play, leisure, and social participation.   CO-MORBIDITIES: may have co-morbidities  that affects occupational performance. Patient will benefit from skilled OT to address above impairments and improve overall function.  MODIFICATION OR ASSISTANCE TO COMPLETE EVALUATION: No modification of tasks or assist necessary to complete an evaluation.  OT OCCUPATIONAL PROFILE AND HISTORY: Detailed assessment: Review of records and additional review of physical, cognitive, psychosocial history related to current functional performance.  CLINICAL DECISION MAKING: LOW - limited treatment options, no task modification necessary  REHAB POTENTIAL: Good  EVALUATION COMPLEXITY: Low    PLAN:  OT FREQUENCY:  16 visits pus eval  OT DURATION: 12 weeks  PLANNED INTERVENTIONS: self care/ADL training, therapeutic exercise, therapeutic activity, neuromuscular re-education, manual therapy, passive range of motion, ultrasound, moist heat, cryotherapy, contrast bath, patient/family education, cognitive remediation/compensation, energy conservation, coping strategies training, DME and/or AE instructions, and Re-evaluation  RECOMMENDED OTHER SERVICES: PT  CONSULTED AND AGREED WITH PLAN OF CARE: Patient  PLAN FOR  NEXT SESSION: review HEP.   Audia Amick, OT 02/20/2023, 10:13 AM

## 2023-02-20 NOTE — Patient Instructions (Signed)
Shoulder Push-Up (Prone on Elbows)    With elbows placed under shoulders, rise up on elbows as high as possible. Keep hips on surface and back a lifted Hold __5__ seconds. Repeat _10__ times. Do __1__ sessions per day.  Copyright  VHI. All rights reserved.    Scapular Retraction (Prone)    Lie with arms at sides. Pinch shoulder blades together and raise arms a few inches from floor. Repeat _10___ times per set. Do ___1_ sets per session. Do _1___ sessions per day.  http://orth.exer.us/954   Copyright  VHI. All rights reserved.  Scapular Retraction: Abduction / Extension (Prone)    Lie with arms out from sides 75*. Pinch shoulder blades together and raise arms a few inches from floor. Repeat _10___ times per set. Do __1__ sets per session. Do __1_ sessions per day.  http://orth.exer.us/958   Copyright  VHI. All rights reserved.      Elbow Flexion: Resisted    With tubing held in [place] hand(s) and other end secured under foot, curl arm up as far as possible. Repeat _10___ times per set. Do _1-2___ sessions per day, every other day.    Elbow Extension: Resisted   Shoulder Retraction / External Rotation With Band    With band held in front, keep elbows at side while rotating hands apart and pulling shoulder blades back. Hold __5__ seconds. Repeat __10__ times. Do __1__ sessions per day.  Copyright  VHI. All rights reserved.    Shoulder Row: Sitting    Face anchor. You can stand, keep arms lower than picture. Palms down, pull elbows back, squeezing shoulder blades together. Repeat 10_ times per set. Do __1 sets per session. Do __7 sessions per week. Anchor Height: Chest  http://tub.exer.us/285   Copyright  VHI. All rights reserved.

## 2023-02-24 ENCOUNTER — Ambulatory Visit: Payer: BC Managed Care – PPO | Admitting: Occupational Therapy

## 2023-02-24 DIAGNOSIS — R41840 Attention and concentration deficit: Secondary | ICD-10-CM

## 2023-02-24 DIAGNOSIS — R41844 Frontal lobe and executive function deficit: Secondary | ICD-10-CM

## 2023-02-24 DIAGNOSIS — M6281 Muscle weakness (generalized): Secondary | ICD-10-CM

## 2023-02-24 DIAGNOSIS — R2689 Other abnormalities of gait and mobility: Secondary | ICD-10-CM

## 2023-02-24 DIAGNOSIS — R2681 Unsteadiness on feet: Secondary | ICD-10-CM

## 2023-02-24 NOTE — Patient Instructions (Signed)

## 2023-02-24 NOTE — Therapy (Signed)
OUTPATIENT OCCUPATIONAL THERAPY NEURO treatment  Patient Name: Colton Zhang MRN: [id number] DOB:February 05, 2001, 22 y.o., male Today's Date: 02/24/2023  PCP: none REFERRING PROVIDER: Murriel Hopper FNP  END OF SESSION:  OT End of Session - 02/24/23 1450     Visit Number 3    Number of Visits 17    Date for OT Re-Evaluation 05/13/23    Authorization Type BC[employer]    Authorization Time Period 12 weeks    Authorization - Visit Number 2    Authorization - Number of Visits 17    OT Start Time 1447    OT Stop Time 1530    OT Time Calculation (min) 43 min              Past Medical History:  Diagnosis Date   ADD (attention deficit disorder)    Asthma    Past Surgical History:  Procedure Laterality Date   CIRCUMCISION     Patient Active Problem List   Diagnosis Date Noted   Acute hypoxic respiratory failure (HCC) 11/25/2022   Status epilepticus (HCC) 11/25/2022   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   Asthma, chronic 11/18/2022   Hypertensive urgency 11/18/2022   Headache(784.0) 09/16/2013   Migraine without aura 09/16/2013   ADD (attention deficit disorder) 09/16/2013   Transient alteration of awareness 09/16/2013    ONSET DATE: 01/30/23 referral date  REFERRING DIAG:  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  Z74.09 (ICD-10-CM) - Other reduced mobility  Z78.9 (ICD-10-CM) - Other specified health status    THERAPY DIAG:  Muscle weakness (generalized)  Other abnormalities of gait and mobility  Unsteadiness on feet  Frontal lobe and executive function deficit  Attention and concentration deficit  Rationale for Evaluation and Treatment: Rehabilitation  SUBJECTIVE:   SUBJECTIVE STATEMENT: Denies pain, pt reports that one of the exercises was hard Pt accompanied by: self  PERTINENT HISTORY: Per chart review:22 year old man who presented to [facility] 6/23 with HA and blurry vision. PMHx significant for asthma, ADD, DVT No prior seizure history. Several days of  hypertension prior to admission. Presented with slurred speech, gait disturbance, blurred vision. Hypertensive to 190 . MRI Brain 6/23 with multifocal cortical edema within both hemispheres, R > L, concerning for viral encephalitis.  patient was started on empiric meningitis coverage (ceftriaxone, vanc, acyclovir). 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 He had prolonged intubation followed by tracheostomy, stayed on vent for over a month. His tracheostomy was reversed last month. He still has a feeding tube, but plan is to follow with surgeon for removal. Pt received therapies at [facility]. PRECAUTIONS: Other: seizures , DVT, peg tube  WEIGHT BEARING RESTRICTIONS: No  PAIN:  Are you having pain? No  FALLS: Has patient fallen in last 6 months? No  LIVING ENVIRONMENT: Lives with: lives with their family Lives in: House/apartment Stairs: Yes: External: Has following equipment at home: Environmental consultant - 2 wheeled  PLOF: Independent  PATIENT GOALS: increase arm strength  OBJECTIVE:   HAND DOMINANCE: Left  ADLs: Overall ADLs: increased time due to balance and endurance Transfers/ambulation related to ADLs: Eating: independent Grooming: independent UB Dressing: mod I LB Dressing: mod I Toileting: mod I without assistive device, only uses walker in public Bathing: sponge bath mod I Tub Shower transfers: not performing due to peg, has shower chair  Equipment: Shower seat with back  IADLs: Shopping: dependent for transportation Light housekeeping: has performed light home management Meal Prep: performs snack prep only has not attempted cooking Community mobility: mod I  with walker Medication management: brother loads pillbox, and then pt takes them Financial management: family is assisting currently Handwriting: 100% legible  MOBILITY STATUS:  mod I    ACTIVITY TOLERANCE: Activity tolerance: 10-15 mins standing, prior to rest  break  Posture- Rounded shoulders with scapular winging UPPER EXTREMITY ROM:  remaining A/ROM [facility]  Active ROM Right eval Left eval  Shoulder flexion 110 105  Shoulder abduction 70 70  Shoulder adduction    Shoulder extension    Shoulder internal rotation    Shoulder external rotation    Elbow flexion    Elbow extension    Wrist flexion    Wrist extension    Wrist ulnar deviation    Wrist radial deviation    Wrist pronation    Wrist supination    (Blank rows = not tested)  UPPER EXTREMITY MMT:     MMT Right eval Left eval  Shoulder flexion 3+/5 3+/5  Shoulder abduction    Shoulder adduction    Shoulder extension    Shoulder internal rotation    Shoulder external rotation    Middle trapezius    Lower trapezius    Elbow flexion 4/5 4-/5  Elbow extension 4/5 4-/5  Wrist flexion    Wrist extension    Wrist ulnar deviation    Wrist radial deviation    Wrist pronation    Wrist supination    (Blank rows = not tested)  HAND FUNCTION: Grip strength: Right: 55 lbs; Left: 85 lbs  COORDINATION: 9 Hole Peg test: Right: 25.59 sec; Left: 21.99 sec  SENSATION: WFL   COGNITION: Overall cognitive status:  to be further assessed in a functional context, pt recalled 1/3 items following a short delay. For spelling [place] backwards pt omitted R  VISION:   VISION ASSESSMENT: Not tested      OBSERVATIONS: Pleasant young man agreeable to therapy   TODAY'S TREATMENT:                                                                                                                              DATE: 02/24/23-Reviewed HEP issued last visit, min v.c for performance, 10-15 reps each exercise. Bird dog exercise was broken down so pt lifted alternate arms x 10 reps then legs x 10 reps instead of together. Arm bike x 8 mins level 3 for conditioning. Pt was educated in memory compensation strategies.   02/20/23- Supine-  exercises for scapular retraction,then chest press and  shoulder flexion with cane min v.c . Pt was instructed in HEP, for UE strength and scapular stability,  yellow band issued for the theraband exercises. see pt instructions- 10 reps each exercise and min v.c (Pt practiced getting up and down from floor safely mod I) Arm bike x 6 mins level 1 for conditioning  02/18/23 - evaluation completed,  Pt performed supine scapular retraction, chest press and shoulder flexion with cane, min v.c and demonstration for shoulder and scapular positioning, seated  low range chest press- mirror used to educated pt in positioning.   PATIENT EDUCATION: Education details: HEP- see pt instructions Person educated: Patient Education method: Explanation, demonstration, handout Education comprehension: verbalized understanding, returned demonstration  HOME EXERCISE PROGRAM:  02/20/23- scapular stability / UE strength   GOALS: Goals reviewed with patient? Yes  SHORT TERM GOALS: Target date: 03/19/23  Pt will be I with Initial HEP Baseline: Goal status:  issued, ongoing 02/20/23  2.  Pt will retrieve a lightweight object at 110 shoulder flexion with LUE Baseline: 105* Goal status: INITIAL  3.  Pt will retrieve a lightweight object at 115 shoulder flexion with RUE Baseline: 110* Goal status: INITIAL  4.  Pt will increase RUE grip strength by 5 lbs for increased RUE functional use. Baseline:  RUE 55 lbs, LUE 85 lbs Goal status: met, 80 lbs  5.  I with memory/ cognitive compensation strategies.  Goal status: ongoing, issued 02/24/23  6.  Pt will perform basic stovetop cooking with supervision.  Goal status: INITIAL  LONG TERM GOALS: Target date: 05/13/23  I with updated HEP  Goal status: INITIAL  2.  Pt will demonstrate increased activity tolerance as evidenced by performing functional activities in standing x 25 mins without rest or LOB.  Goal status: INITIAL  3.  Pt will retrieve a 5 lbs object at 120 shoulder flexion with LUE with good  control Baseline: 105* Goal status: INITIAL   4.   Pt will retrieve a 5 lbs object at 120 shoulder flexion with RUE with good control Baseline: 110* Goal status: INITIAL  5.  Pt will perform basic stovetop cooking/ mod complex home management withmod I  Goal status: INITIAL  6.  Pt will perform simulated work activities modified independently without LOB  Goal status: INITIAL  ASSESSMENT:  CLINICAL IMPRESSION: Pt is progressing towards goals. He demonstrates understanding of HEP following review. Pt verbalized understanding of memory compensation strategies.  PERFORMANCE DEFICITS: in functional skills including ADLs, IADLs, coordination, dexterity, proprioception, sensation, ROM, strength, flexibility, Fine motor control, Gross motor control, mobility, balance, endurance, decreased knowledge of precautions, decreased knowledge of use of DME, and UE functional use, cognitive skills including memory and thought, and psychosocial skills including coping strategies, environmental adaptation, habits, interpersonal interactions, and routines and behaviors.   IMPAIRMENTS: are limiting patient from ADLs, IADLs, work, play, leisure, and social participation.   CO-MORBIDITIES: may have co-morbidities  that affects occupational performance. Patient will benefit from skilled OT to address above impairments and improve overall function.  MODIFICATION OR ASSISTANCE TO COMPLETE EVALUATION: No modification of tasks or assist necessary to complete an evaluation.  OT OCCUPATIONAL PROFILE AND HISTORY: Detailed assessment: Review of records and additional review of physical, cognitive, psychosocial history related to current functional performance.  CLINICAL DECISION MAKING: LOW - limited treatment options, no task modification necessary  REHAB POTENTIAL: Good  EVALUATION COMPLEXITY: Low    PLAN:  OT FREQUENCY:  16 visits pus eval  OT DURATION: 12 weeks  PLANNED INTERVENTIONS: self care/ADL  training, therapeutic exercise, therapeutic activity, neuromuscular re-education, manual therapy, passive range of motion, ultrasound, moist heat, cryotherapy, contrast bath, patient/family education, cognitive remediation/compensation, energy conservation, coping strategies training, DME and/or AE instructions, and Re-evaluation  RECOMMENDED OTHER SERVICES: PT  CONSULTED AND AGREED WITH PLAN OF CARE: Patient  PLAN FOR NEXT SESSION:  continue to address proximal strength and core stability   Taeja Debellis, OT 02/24/2023, 3:30 PM

## 2023-02-26 ENCOUNTER — Ambulatory Visit: Payer: BC Managed Care – PPO | Attending: Nurse Practitioner | Admitting: Occupational Therapy

## 2023-02-26 ENCOUNTER — Encounter: Payer: Self-pay | Admitting: Occupational Therapy

## 2023-02-26 DIAGNOSIS — R2689 Other abnormalities of gait and mobility: Secondary | ICD-10-CM | POA: Insufficient documentation

## 2023-02-26 DIAGNOSIS — R41840 Attention and concentration deficit: Secondary | ICD-10-CM | POA: Insufficient documentation

## 2023-02-26 DIAGNOSIS — R41844 Frontal lobe and executive function deficit: Secondary | ICD-10-CM | POA: Insufficient documentation

## 2023-02-26 DIAGNOSIS — M6281 Muscle weakness (generalized): Secondary | ICD-10-CM | POA: Insufficient documentation

## 2023-02-26 DIAGNOSIS — R2681 Unsteadiness on feet: Secondary | ICD-10-CM | POA: Insufficient documentation

## 2023-02-26 NOTE — Therapy (Signed)
OUTPATIENT OCCUPATIONAL THERAPY NEURO treatment  Patient Name: Colton Zhang MRN: [id number] DOB:11-12-2000, 22 y.o., male Today's Date: 02/26/2023  PCP: none REFERRING PROVIDER: Murriel Hopper FNP  END OF SESSION:  OT End of Session - 02/26/23 1448     Visit Number 4    Number of Visits 17    Date for OT Re-Evaluation 05/13/23    Authorization Time Period 12 weeks    Authorization - Visit Number 3    Authorization - Number of Visits 17              Past Medical History:  Diagnosis Date   ADD (attention deficit disorder)    Asthma    Past Surgical History:  Procedure Laterality Date   CIRCUMCISION     Patient Active Problem List   Diagnosis Date Noted   Acute hypoxic respiratory failure (HCC) 11/25/2022   Status epilepticus (HCC) 11/25/2022   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   Asthma, chronic 11/18/2022   Hypertensive urgency 11/18/2022   Headache 09/16/2013   Migraine without aura 09/16/2013   ADD (attention deficit disorder) 09/16/2013   Transient alteration of awareness 09/16/2013    ONSET DATE: 01/30/23 referral date  REFERRING DIAG:  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 PMHx significant for asthma, ADD, DVT No prior seizure history. Several days of hypertension prior to admission. Presented with slurred speech, gait disturbance, blurred vision. Hypertensive to 190 . MRI Brain 6/23 with multifocal cortical edema  within both hemispheres, R > L, concerning for viral encephalitis.  patient was started on empiric meningitis coverage (ceftriaxone, vanc, acyclovir). 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 He had prolonged intubation followed by tracheostomy, stayed on vent for over a month. His tracheostomy was reversed last month. He still has a feeding tube, but plan is to follow with surgeon for removal. Pt received therapies at [facility]. PRECAUTIONS: Other: seizures , DVT, peg tube  WEIGHT BEARING RESTRICTIONS: No  PAIN:  Are you having pain? No  FALLS: Has patient fallen in last 6 months? No  LIVING ENVIRONMENT: Lives with: lives with their family Lives in: House/apartment Stairs: Yes: External: Has following equipment at home: Environmental consultant - 2 wheeled  PLOF: Independent  PATIENT GOALS: increase arm strength  OBJECTIVE:   HAND DOMINANCE: Left  ADLs: Overall ADLs: increased time due to balance and endurance Transfers/ambulation related to ADLs: Eating: independent Grooming: independent UB Dressing: mod I LB Dressing: mod I Toileting: mod I without assistive device, only uses walker in public Bathing: sponge bath mod I Tub Shower transfers: not performing due to peg, has shower chair  Equipment: Shower seat with back  IADLs: Shopping: dependent for transportation Light housekeeping: has performed light home management Meal Prep: performs snack prep only has not attempted cooking Community mobility: mod I with walker Medication management: brother loads pillbox, and then pt takes them Financial management: family is assisting currently Handwriting: 100% legible  MOBILITY STATUS:  mod I    ACTIVITY TOLERANCE: Activity tolerance: 10-15 mins standing, prior  to rest break  Posture- Rounded shoulders with scapular winging UPPER EXTREMITY ROM:  remaining A/ROM [facility]  Active ROM Right eval Left eval  Shoulder flexion 110 105  Shoulder  abduction 70 70  Shoulder adduction    Shoulder extension    Shoulder internal rotation    Shoulder external rotation    Elbow flexion    Elbow extension    Wrist flexion    Wrist extension    Wrist ulnar deviation    Wrist radial deviation    Wrist pronation    Wrist supination    (Blank rows = not tested)  UPPER EXTREMITY MMT:     MMT Right eval Left eval  Shoulder flexion 3+/5 3+/5  Shoulder abduction    Shoulder adduction    Shoulder extension    Shoulder internal rotation    Shoulder external rotation    Middle trapezius    Lower trapezius    Elbow flexion 4/5 4-/5  Elbow extension 4/5 4-/5  Wrist flexion    Wrist extension    Wrist ulnar deviation    Wrist radial deviation    Wrist pronation    Wrist supination    (Blank rows = not tested)  HAND FUNCTION: Grip strength: Right: 55 lbs; Left: 85 lbs  COORDINATION: 9 Hole Peg test: Right: 25.59 sec; Left: 21.99 sec  SENSATION: WFL   COGNITION: Overall cognitive status:  to be further assessed in a functional context, pt recalled 1/3 items following a short delay. For spelling World backwards pt omitted R  VISION:   VISION ASSESSMENT: Not tested      OBSERVATIONS: Pleasant young man agreeable to therapy   TODAY'S TREATMENT:                                                                                                                              DATE:02/26/23-Arm bike level 4 for conditioning x 8 mins Supine-Pt held a 3 lbs weight between palms and performed chest press with min facilitation. He attempted shoulder flexion however he was unable to control his right shoulder so task was discontinued. Supine pt was instructed in HEP for chest press and shoulder flexion holding foam roll 10 reps each and min v.c  Pt was instructed to use a paper towel roll at home for these exercises. Prone on elbows lifting chest. Quadraped rocking forwards and backwards then bid dog with therapist performing min  facilitation.     02/24/23-Reviewed HEP issued last visit, min v.c for performance, 10-15 reps each exercise. Bird dog exercise was broken down so pt lifted alternate arms x 10 reps then legs x 10 reps instead of together. Arm bike x 8 mins level 3 for conditioning. Pt was educated in memory compensation strategies.   02/20/23- Supine-  exercises for scapular retraction,then chest press and shoulder flexion with cane min v.c . Pt was instructed in HEP, for UE strength and scapular stability,  yellow band issued for the  theraband exercises. see pt instructions- 10 reps each exercise and min v.c (Pt practiced getting up and down from floor safely mod I) Arm bike x 6 mins level 1 for conditioning  02/18/23 - evaluation completed,  Pt performed supine scapular retraction, chest press and shoulder flexion with cane, min v.c and demonstration for shoulder and scapular positioning, seated low range chest press- mirror used to educated pt in positioning.   PATIENT EDUCATION: Education details: HEP- see pt instructions, paper towel roll Person educated: Patient Education method: Explanation, demonstration, handout Education comprehension: verbalized understanding, returned demonstration  HOME EXERCISE PROGRAM:  02/20/23- scapular stability / UE strength   GOALS: Goals reviewed with patient? Yes  SHORT TERM GOALS: Target date: 03/19/23  Pt will be I with Initial HEP Baseline: Goal status: met, 02/26/23 2.  Pt will retrieve a lightweight object at 110 shoulder flexion with LUE Baseline: 105* Goal status: INITIAL  3.  Pt will retrieve a lightweight object at 115 shoulder flexion with RUE Baseline: 110* Goal status: INITIAL  4.  Pt will increase RUE grip strength by 5 lbs for increased RUE functional use. Baseline:  RUE 55 lbs, LUE 85 lbs Goal status: met, 80 lbs  5.  I with memory/ cognitive compensation strategies.  Goal status: ongoing, issued 02/24/23  6.  Pt will perform basic  stovetop cooking with supervision.  Goal status: I ongoing 02/26/23  LONG TERM GOALS: Target date: 05/13/23  I with updated HEP  Goal status: INITIAL  2.  Pt will demonstrate increased activity tolerance as evidenced by performing functional activities in standing x 25 mins without rest or LOB.  Goal status: INITIAL  3.  Pt will retrieve a 5 lbs object at 120 shoulder flexion with LUE with good control Baseline: 105* Goal status: INITIAL   4.   Pt will retrieve a 5 lbs object at 120 shoulder flexion with RUE with good control Baseline: 110* Goal status: INITIAL  5.  Pt will perform basic stovetop cooking/ mod complex home management withmod I  Goal status: INITIAL  6.  Pt will perform simulated work activities modified independently without LOB  Goal status: INITIAL  ASSESSMENT:  CLINICAL IMPRESSION: Pt is progressing towards goals. He demonstrates improving  UE strength and control. PERFORMANCE DEFICITS: in functional skills including ADLs, IADLs, coordination, dexterity, proprioception, sensation, ROM, strength, flexibility, Fine motor control, Gross motor control, mobility, balance, endurance, decreased knowledge of precautions, decreased knowledge of use of DME, and UE functional use, cognitive skills including memory and thought, and psychosocial skills including coping strategies, environmental adaptation, habits, interpersonal interactions, and routines and behaviors.   IMPAIRMENTS: are limiting patient from ADLs, IADLs, work, play, leisure, and social participation.   CO-MORBIDITIES: may have co-morbidities  that affects occupational performance. Patient will benefit from skilled OT to address above impairments and improve overall function.  MODIFICATION OR ASSISTANCE TO COMPLETE EVALUATION: No modification of tasks or assist necessary to complete an evaluation.  OT OCCUPATIONAL PROFILE AND HISTORY: Detailed assessment: Review of records and additional review of  physical, cognitive, psychosocial history related to current functional performance.  CLINICAL DECISION MAKING: LOW - limited treatment options, no task modification necessary  REHAB POTENTIAL: Good  EVALUATION COMPLEXITY: Low    PLAN:  OT FREQUENCY:  16 visits pus eval  OT DURATION: 12 weeks  PLANNED INTERVENTIONS: self care/ADL training, therapeutic exercise, therapeutic activity, neuromuscular re-education, manual therapy, passive range of motion, ultrasound, moist heat, cryotherapy, contrast bath, patient/family education, cognitive remediation/compensation, energy conservation, coping strategies training,  DME and/or AE instructions, and Re-evaluation  RECOMMENDED OTHER SERVICES: PT  CONSULTED AND AGREED WITH PLAN OF CARE: Patient  PLAN FOR NEXT SESSION:  continue to address proximal strength and core stability   Kevontae Burgoon, OT 02/26/2023, 2:49 PM

## 2023-02-26 NOTE — Patient Instructions (Addendum)
Shoulder: Flexion (Supine)    With hands shoulder width apart, slowly lower dowel to floor behind head. Do not let elbows bend. Keep back flat. Hold __5__ seconds. Repeat _10___ times. Do __1-2__ sessions per day.  ELBOW: Extension / Chest Press (Frame)    Lie on back with knees bent. Straighten elbows to raise frame. Hold _5__ seconds. 10___ reps per set, _1__ sets per day, __7_ days per week Use steering wheel or hula hoop instead of frame.  Copyright  VHI. All rights reserved.   CAUTION: Stretch slowly and gently.  Copyright  VHI. All rights reserved.

## 2023-03-03 ENCOUNTER — Ambulatory Visit: Payer: BC Managed Care – PPO | Admitting: Occupational Therapy

## 2023-03-03 ENCOUNTER — Encounter: Payer: Self-pay | Admitting: Occupational Therapy

## 2023-03-03 VITALS — BP 138/86 | HR 67

## 2023-03-03 DIAGNOSIS — R41844 Frontal lobe and executive function deficit: Secondary | ICD-10-CM

## 2023-03-03 DIAGNOSIS — M6281 Muscle weakness (generalized): Secondary | ICD-10-CM

## 2023-03-03 DIAGNOSIS — R2689 Other abnormalities of gait and mobility: Secondary | ICD-10-CM

## 2023-03-03 DIAGNOSIS — R2681 Unsteadiness on feet: Secondary | ICD-10-CM

## 2023-03-03 DIAGNOSIS — R41840 Attention and concentration deficit: Secondary | ICD-10-CM

## 2023-03-03 NOTE — Therapy (Signed)
OUTPATIENT OCCUPATIONAL THERAPY NEURO treatment  Patient Name: Colton Zhang MRN: [id number] DOB:30-Sep-2000, 22 y.o., male Today's Date: 03/03/2023  PCP: none REFERRING PROVIDER: Murriel Hopper FNP  END OF SESSION:  OT End of Session - 03/03/23 1555     Visit Number 5    Number of Visits 17    Date for OT Re-Evaluation 05/13/23    Authorization Type [employer]    Authorization Time Period 12 weeks    Authorization - Visit Number 5    Authorization - Number of Visits 17    OT Start Time 1448    OT Stop Time 1527    OT Time Calculation (min) 39 min    Activity Tolerance Patient tolerated treatment well    Behavior During Therapy WFL for tasks assessed/performed               Past Medical History:  Diagnosis Date   ADD (attention deficit disorder)    Asthma    Past Surgical History:  Procedure Laterality Date   CIRCUMCISION     Patient Active Problem List   Diagnosis Date Noted   Acute hypoxic respiratory failure (HCC) 11/25/2022   Status epilepticus (HCC) 11/25/2022   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   Asthma, chronic 11/18/2022   Hypertensive urgency 11/18/2022   Headache 09/16/2013   Migraine without aura 09/16/2013   ADD (attention deficit disorder) 09/16/2013   Transient alteration of awareness 09/16/2013    ONSET DATE: 01/30/23 referral date  REFERRING DIAG:  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  Z74.09 (ICD-10-CM) - Other reduced mobility  Z78.9 (ICD-10-CM) - Other specified health status    THERAPY DIAG:  Muscle weakness (generalized)  Other abnormalities of gait and mobility  Unsteadiness on feet  Frontal lobe and executive function deficit  Attention and concentration deficit  Rationale for Evaluation and Treatment: Rehabilitation  SUBJECTIVE:   SUBJECTIVE STATEMENT: Pt reports he is having trouble getting some of his medications, he plans to call PCP Pt accompanied by: self  PERTINENT HISTORY: Per chart review:22 year old man  who presented to [facility] 6/23 with HA and blurry vision. PMHx significant for asthma, ADD, DVT No prior seizure history. Several days of hypertension prior to admission. Presented with slurred speech, gait disturbance, blurred vision. Hypertensive to 190 . MRI Brain 6/23 with multifocal cortical edema within both hemispheres, R > L, concerning for viral encephalitis.  patient was started on empiric meningitis coverage (ceftriaxone, vanc, acyclovir). 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 He had prolonged intubation followed by tracheostomy, stayed on vent for over a month. His tracheostomy was reversed last month. He still has a feeding tube, but plan is to follow with surgeon for removal. Pt received therapies at [facility]. PRECAUTIONS: Other: seizures , DVT, peg tube  WEIGHT BEARING RESTRICTIONS: No  PAIN:  Are you having pain? No  FALLS: Has patient fallen in last 6 months? No  LIVING ENVIRONMENT: Lives with: lives with their family Lives in: House/apartment Stairs: Yes: External: Has following equipment at home: Walker - 2 wheeled  PLOF: Independent  PATIENT GOALS: increase arm strength  OBJECTIVE:   HAND DOMINANCE: Left  ADLs: Overall ADLs: increased time due to balance and endurance Transfers/ambulation related to ADLs: Eating: independent Grooming: independent UB Dressing: mod I LB Dressing: mod I Toileting: mod I without assistive device, only uses walker in public Bathing: sponge bath mod I Tub Shower transfers: not performing due to peg, has shower chair  Equipment: Shower seat with back  IADLs:  Shopping: dependent for transportation Light housekeeping: has performed light home management Meal Prep: performs snack prep only has not attempted cooking Community mobility: mod I with walker Medication management: brother loads pillbox, and then pt takes them Financial management: family is assisting currently Handwriting:  100% legible  MOBILITY STATUS:  mod I    ACTIVITY TOLERANCE: Activity tolerance: 10-15 mins standing, prior to rest break  Posture- Rounded shoulders with scapular winging UPPER EXTREMITY ROM:  remaining A/ROM [facility]  Active ROM Right eval Left eval  Shoulder flexion 110 105  Shoulder abduction 70 70  Shoulder adduction    Shoulder extension    Shoulder internal rotation    Shoulder external rotation    Elbow flexion    Elbow extension    Wrist flexion    Wrist extension    Wrist ulnar deviation    Wrist radial deviation    Wrist pronation    Wrist supination    (Blank rows = not tested)  UPPER EXTREMITY MMT:     MMT Right eval Left eval  Shoulder flexion 3+/5 3+/5  Shoulder abduction    Shoulder adduction    Shoulder extension    Shoulder internal rotation    Shoulder external rotation    Middle trapezius    Lower trapezius    Elbow flexion 4/5 4-/5  Elbow extension 4/5 4-/5  Wrist flexion    Wrist extension    Wrist ulnar deviation    Wrist radial deviation    Wrist pronation    Wrist supination    (Blank rows = not tested)  HAND FUNCTION: Grip strength: Right: 55 lbs; Left: 85 lbs  COORDINATION: 9 Hole Peg test: Right: 25.59 sec; Left: 21.99 sec  SENSATION: WFL   COGNITION: Overall cognitive status:  to be further assessed in a functional context, pt recalled 1/3 items following a short delay. For spelling World backwards pt omitted R  VISION:   VISION ASSESSMENT: Not tested      OBSERVATIONS: Pleasant young man agreeable to therapy   TODAY'S TREATMENT:                                                                                                                              DATE:03/03/23 Arm bike x 6 mins level 4 for conditioning. Supine pt performed chest press and shoulder flexion holding foam roll 10 reps each and min v.c  Prone on elbows lifting chest then reaching with right and left UE's diagonally, followed by exercises in  quadraped, rocking forwards and backwards, lifting alternate arms, then alternate legs, then performing thread the needle, min facilitation/ v.c  Theraband exercises with yellow band for rowing and shoulder extension, 10-15 reps eac, min v.c  02/26/23-Arm bike level 4 for conditioning x 8 mins Supine-Pt held a 3 lbs weight between palms and performed chest press with min facilitation. He attempted shoulder flexion however he was unable to control his right shoulder so task was discontinued. Supine  pt was instructed in HEP for chest press and shoulder flexion holding foam roll 10 reps each and min v.c  Pt was instructed to use a paper towel roll at home for these exercises. Prone on elbows lifting chest. Quadraped rocking forwards and backwards then bid dog with therapist performing min facilitation.     02/24/23-Reviewed HEP issued last visit, min v.c for performance, 10-15 reps each exercise. Bird dog exercise was broken down so pt lifted alternate arms x 10 reps then legs x 10 reps instead of together. Arm bike x 8 mins level 3 for conditioning. Pt was educated in memory compensation strategies.   02/20/23- Supine-  exercises for scapular retraction,then chest press and shoulder flexion with cane min v.c . Pt was instructed in HEP, for UE strength and scapular stability,  yellow band issued for the theraband exercises. see pt instructions- 10 reps each exercise and min v.c (Pt practiced getting up and down from floor safely mod I) Arm bike x 6 mins level 1 for conditioning  02/18/23 - evaluation completed,  Pt performed supine scapular retraction, chest press and shoulder flexion with cane, min v.c and demonstration for shoulder and scapular positioning, seated low range chest press- mirror used to educated pt in positioning.   PATIENT EDUCATION: Education details:see above Person educated: Patient Education method: Explanation, demonstration, handout Education comprehension: verbalized  understanding, returned demonstration  HOME EXERCISE PROGRAM:  02/20/23- scapular stability / UE strength   GOALS: Goals reviewed with patient? Yes  SHORT TERM GOALS: Target date: 03/19/23  Pt will be I with Initial HEP Baseline: Goal status: met, 02/26/23 2.  Pt will retrieve a lightweight object at 110 shoulder flexion with LUE Baseline: 105* Goal status: ongoing 03/03/23  3.  Pt will retrieve a lightweight object at 115 shoulder flexion with RUE Baseline: 110* Goal status: ongoing 03/03/23  4.  Pt will increase RUE grip strength by 5 lbs for increased RUE functional use. Baseline:  RUE 55 lbs, LUE 85 lbs Goal status: met, 80 lbs  5.  I with memory/ cognitive compensation strategies.  Goal status: ongoing, issued 02/24/23  6.  Pt will perform basic stovetop cooking with supervision.  Goal status:  ongoing 02/26/23  LONG TERM GOALS: Target date: 05/13/23  I with updated HEP  Goal status: INITIAL  2.  Pt will demonstrate increased activity tolerance as evidenced by performing functional activities in standing x 25 mins without rest or LOB.  Goal status: INITIAL  3.  Pt will retrieve a 5 lbs object at 120 shoulder flexion with LUE with good control Baseline: 105* Goal status: INITIAL   4.   Pt will retrieve a 5 lbs object at 120 shoulder flexion with RUE with good control Baseline: 110* Goal status: INITIAL  5.  Pt will perform basic stovetop cooking/ mod complex home management withmod I  Goal status: INITIAL  6.  Pt will perform simulated work activities modified independently without LOB  Goal status: INITIAL  ASSESSMENT:  CLINICAL IMPRESSION: Pt is progressing towards goals with improving strength and endurance. PERFORMANCE DEFICITS: in functional skills including ADLs, IADLs, coordination, dexterity, proprioception, sensation, ROM, strength, flexibility, Fine motor control, Gross motor control, mobility, balance, endurance, decreased knowledge of  precautions, decreased knowledge of use of DME, and UE functional use, cognitive skills including memory and thought, and psychosocial skills including coping strategies, environmental adaptation, habits, interpersonal interactions, and routines and behaviors.   IMPAIRMENTS: are limiting patient from ADLs, IADLs, work, play, leisure, and social participation.  CO-MORBIDITIES: may have co-morbidities  that affects occupational performance. Patient will benefit from skilled OT to address above impairments and improve overall function.  MODIFICATION OR ASSISTANCE TO COMPLETE EVALUATION: No modification of tasks or assist necessary to complete an evaluation.  OT OCCUPATIONAL PROFILE AND HISTORY: Detailed assessment: Review of records and additional review of physical, cognitive, psychosocial history related to current functional performance.  CLINICAL DECISION MAKING: LOW - limited treatment options, no task modification necessary  REHAB POTENTIAL: Good  EVALUATION COMPLEXITY: Low    PLAN:  OT FREQUENCY:  16 visits pus eval  OT DURATION: 12 weeks  PLANNED INTERVENTIONS: self care/ADL training, therapeutic exercise, therapeutic activity, neuromuscular re-education, manual therapy, passive range of motion, ultrasound, moist heat, cryotherapy, contrast bath, patient/family education, cognitive remediation/compensation, energy conservation, coping strategies training, DME and/or AE instructions, and Re-evaluation  RECOMMENDED OTHER SERVICES: PT  CONSULTED AND AGREED WITH PLAN OF CARE: Patient  PLAN FOR NEXT SESSION:  continue to address proximal strength and core stability   Wilhelmenia Addis, OT 03/03/2023, 3:57 PM

## 2023-03-05 ENCOUNTER — Ambulatory Visit: Payer: BC Managed Care – PPO | Admitting: Physical Therapy

## 2023-03-05 ENCOUNTER — Ambulatory Visit: Payer: BC Managed Care – PPO | Admitting: Occupational Therapy

## 2023-03-05 ENCOUNTER — Encounter: Payer: Self-pay | Admitting: Physical Therapy

## 2023-03-05 DIAGNOSIS — R2689 Other abnormalities of gait and mobility: Secondary | ICD-10-CM

## 2023-03-05 DIAGNOSIS — M6281 Muscle weakness (generalized): Secondary | ICD-10-CM

## 2023-03-05 DIAGNOSIS — R2681 Unsteadiness on feet: Secondary | ICD-10-CM

## 2023-03-05 DIAGNOSIS — R41844 Frontal lobe and executive function deficit: Secondary | ICD-10-CM

## 2023-03-05 DIAGNOSIS — R41840 Attention and concentration deficit: Secondary | ICD-10-CM

## 2023-03-05 NOTE — Therapy (Signed)
OUTPATIENT PHYSICAL THERAPY LOWER EXTREMITY TREATMENT   Patient Name: Colton Zhang MRN: [id number] DOB:[birth year], 22 y.o., male Today's Date: 03/05/2023  END OF SESSION:  PT End of Session - 03/05/23 1516     Visit Number 2    Date for PT Re-Evaluation 05/20/23    PT Start Time 1529    PT Stop Time 1612    PT Time Calculation (min) 43 min    Activity Tolerance Patient tolerated treatment well    Behavior During Therapy [facility] for tasks assessed/performed             Past Medical History:  Diagnosis Date   ADD (attention deficit disorder)    Asthma    Past Surgical History:  Procedure Laterality Date   CIRCUMCISION     Patient Active Problem List   Diagnosis Date Noted   Acute hypoxic respiratory failure (HCC) 11/25/2022   Status epilepticus (HCC) 11/25/2022   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   Asthma, chronic 11/18/2022   Hypertensive urgency 11/18/2022   Headache 09/16/2013   Migraine without aura 09/16/2013   ADD (attention deficit disorder) 09/16/2013   Transient alteration of awareness 09/16/2013    PCP: None  REFERRING PROVIDER: Lavone Nian, FNP  REFERRING DIAG: difficulty walking and weakness  THERAPY DIAG:  Muscle weakness (generalized)  Other abnormalities of gait and mobility  Unsteadiness on feet  Unsteady gait  Rationale for Evaluation and Treatment: Rehabilitation  ONSET DATE: 11/21/22  SUBJECTIVE:   SUBJECTIVE STATEMENT: Patient has been doing well, no longer using FWW or cane, reports able to go and watch a soccer game without any difficulty getting around  PERTINENT HISTORY: HA, ADD, encephalitis. Epilepticus, asthma PAIN:  Are you having pain? Feeding tube site pain  PRECAUTIONS: Fall  RED FLAGS: None   WEIGHT BEARING RESTRICTIONS: No  FALLS:  Has patient fallen in last 6 months? 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#  PLOF: Independent and no issues  PATIENT GOALS: be normal like before  NEXT MD VISIT: none   OBJECTIVE:   DIAGNOSTIC FINDINGS: Encephalitis  COGNITION: Overall cognitive status: Within functional limits for tasks assessed    SENSATION: WFL   POSTURE: rounded shoulders and forward head  LOWER EXTREMITY ROM:  WFL's  LOWER EXTREMITY MMT:  MMT Right eval Left eval  Hip flexion 4- 4-  Hip extension 4- 4-  Hip abduction 4 4  Hip adduction    Hip internal rotation    Hip external rotation    Knee flexion 4 4  Knee extension 4- 4-  Ankle dorsiflexion 4- 4-  Ankle plantarflexion 4- 4-  Ankle inversion 4- 4-  Ankle eversion 4- 4-   (Blank rows = not tested)  FUNCTIONAL TESTS:  5 times sit to stand: 18 seconds, but with difficulty, had some compensation in the hips Timed up and go (TUG): 11 seconds no device  3 minute walk test: not tested Berg Balance Scale: 54/56 Dynamic Gait Index: 18  GAIT: Distance walked: 100' Assistive device utilized: None Level of assistance: Modified independence Comments: at times some weaving  at times, stairs independent   TODAY'S TREATMENT:  DATE:   03/05/23 Gait around the back building with a brisk pace, no issues Leg extension 10# 2x15 25# leg curls 2x15 struggled a little with this one due to weakness Leg press 40# 2x15 Resisted gait all directions 50# 10# hip extension and abduction 2x10 Reviewed safe HEP and return to a gym if he would like, went over safety, form and weight and reps in our gym, answered questions with him   PATIENT EDUCATION:  Education details: HEP/POC Person educated: Patient Education method: Medical illustrator Education  comprehension: verbalized understanding  HOME EXERCISE PROGRAM: Standing hip abduction and extension, marching  ASSESSMENT:  CLINICAL IMPRESSION: Patient is a 22 y.o. male who was seen today for physical therapy evaluation and treatment for weakness and difficulty walking.  He was in the hospital for 2 months, lost 50#, is on a feeding tube.  He is doing very well, reports no real issues with his mobility, not using cane or walker over the past 3 weeks.  I put him through a LE gym program and he did well, just a little weak.  I feel that he can safely continue on his own  OBJECTIVE IMPAIRMENTS: Abnormal gait, cardiopulmonary status limiting activity, decreased activity tolerance, decreased balance, decreased coordination, decreased endurance, decreased mobility, difficulty walking, decreased ROM, decreased strength, impaired flexibility, improper body mechanics, postural dysfunction, and pain.   REHAB POTENTIAL: Good  CLINICAL DECISION MAKING: Stable/uncomplicated  EVALUATION COMPLEXITY: Low   GOALS: Goals reviewed with patient? Yes  SHORT TERM GOALS: Target date: 02/28/23 Independent with initial HEP Goal status: met 03/05/23  LONG TERM GOALS: Target date: 05/20/23  Independent with advanced HEP Goal status: met 03/05/23  2.  Decrease TUG time to 7 seconds Goal status: met 03/05/23  3.  5XSTS 12 seconds without compensation Goal status: met 03/05/23  4.  Walk 1000+ feet in the community without difficulty Goal status: met 03/05/23  PLAN:  PT FREQUENCY: 1x/week  PT DURATION: 8 weeks  PLANNED INTERVENTIONS: Therapeutic exercises, Therapeutic activity, Neuromuscular re-education, Balance training, Gait training, Patient/Family education, Self Care, Joint mobilization, Stair training, and Manual therapy  PLAN FOR NEXT SESSION: all goals met, will D/C PT  Annissa Andreoni W, PT 03/05/2023, 3:17 PM

## 2023-03-05 NOTE — Therapy (Signed)
OUTPATIENT OCCUPATIONAL THERAPY NEURO treatment  Patient Name: Colton Zhang MRN: [id number] DOB:03/01/2001, 22 y.o., male Today's Date: 03/05/2023  PCP: none REFERRING PROVIDER: Murriel Hopper FNP  END OF SESSION:  OT End of Session - 03/05/23 1648     Visit Number 6    Number of Visits 17    Date for OT Re-Evaluation 05/13/23    Authorization Type [employer]    Authorization Time Period 12 weeks    Authorization - Visit Number 6    Authorization - Number of Visits 17    OT Start Time 1450    OT Stop Time 1530    OT Time Calculation (min) 40 min    Activity Tolerance Patient tolerated treatment well    Behavior During Therapy [facility] for tasks assessed/performed                Past Medical History:  Diagnosis Date   ADD (attention deficit disorder)    Asthma    Past Surgical History:  Procedure Laterality Date   CIRCUMCISION     Patient Active Problem List   Diagnosis Date Noted   Acute hypoxic respiratory failure (HCC) 11/25/2022   Status epilepticus (HCC) 11/25/2022   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   Asthma, chronic 11/18/2022   Hypertensive urgency 11/18/2022   Headache 09/16/2013   Migraine without aura 09/16/2013   ADD (attention deficit disorder) 09/16/2013   Transient alteration of awareness 09/16/2013    ONSET DATE: 01/30/23 referral date  REFERRING DIAG:  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 PMHx significant  for asthma, ADD, DVT No prior seizure history. Several days of hypertension prior to admission. Presented with slurred speech, gait disturbance, blurred vision. Hypertensive to 190 . MRI Brain 6/23 with multifocal cortical edema within both hemispheres, R > L, concerning for viral encephalitis.  patient was started on empiric meningitis coverage (ceftriaxone, vanc, acyclovir). 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 He had prolonged intubation followed by tracheostomy, stayed on vent for over a month. His tracheostomy was reversed last month. He still has a feeding tube, but plan is to follow with surgeon for removal. Pt received therapies at [facility]. PRECAUTIONS: Other: seizures , DVT, peg tube  WEIGHT BEARING RESTRICTIONS: No  PAIN:  Are you having pain? No  FALLS: Has patient fallen in last 6 months? No  LIVING ENVIRONMENT: Lives with: lives with their family Lives in: House/apartment Stairs: Yes: External: Has following equipment at home: Walker - 2 wheeled  PLOF: Independent  PATIENT GOALS: increase arm strength  OBJECTIVE:   HAND DOMINANCE: Left  ADLs: Overall ADLs: increased time due to balance and endurance Transfers/ambulation related to ADLs: Eating: independent Grooming: independent UB Dressing: mod I LB Dressing: mod I Toileting: mod I without assistive device, only uses walker in public Bathing: sponge bath mod I Tub Shower transfers: not performing due to peg, has shower chair  Equipment: Shower seat with back  IADLs: Shopping: dependent for transportation Light housekeeping: has performed light home management Meal  Prep: performs snack prep only has not attempted cooking Community mobility: mod I with walker Medication management: brother loads pillbox, and then pt takes them Financial management: family is assisting currently Handwriting: 100% legible  MOBILITY STATUS:  mod I    ACTIVITY  TOLERANCE: Activity tolerance: 10-15 mins standing, prior to rest break  Posture- Rounded shoulders with scapular winging UPPER EXTREMITY ROM:  remaining A/ROM [facility]  Active ROM Right eval Left eval  Shoulder flexion 110 105  Shoulder abduction 70 70  Shoulder adduction    Shoulder extension    Shoulder internal rotation    Shoulder external rotation    Elbow flexion    Elbow extension    Wrist flexion    Wrist extension    Wrist ulnar deviation    Wrist radial deviation    Wrist pronation    Wrist supination    (Blank rows = not tested)  UPPER EXTREMITY MMT:     MMT Right eval Left eval  Shoulder flexion 3+/5 3+/5  Shoulder abduction    Shoulder adduction    Shoulder extension    Shoulder internal rotation    Shoulder external rotation    Middle trapezius    Lower trapezius    Elbow flexion 4/5 4-/5  Elbow extension 4/5 4-/5  Wrist flexion    Wrist extension    Wrist ulnar deviation    Wrist radial deviation    Wrist pronation    Wrist supination    (Blank rows = not tested)  HAND FUNCTION: Grip strength: Right: 55 lbs; Left: 85 lbs  COORDINATION: 9 Hole Peg test: Right: 25.59 sec; Left: 21.99 sec  SENSATION: WFL   COGNITION: Overall cognitive status:  to be further assessed in a functional context, pt recalled 1/3 items following a short delay. For spelling World backwards pt omitted R  VISION:   VISION ASSESSMENT: Not tested      OBSERVATIONS: Pleasant young man agreeable to therapy   TODAY'S TREATMENT:                                                                                                                              DATE:03/05/23 Arm bike x 6 mins level 4 for conditioning. Supine pt performed chest press and shoulder flexion holding  2 lbs bar,  10 reps each and min v.c  quadraped, rocking forwards and backwards, bird dog over physio ball, min facilitation, then performing thread the needle, min facilitation/ v.c , tall kneeling  rolling ball forwards and backwards for core control, min facilitation Standing for rowing, shoulder abduction, and biceps curls with weight machine, 5 lbs each UE, 10 reps min facilitation.  UE ranger mid level shoulder flexion  03/03/23 Arm bike x 6 mins level 4 for conditioning. Supine pt performed chest press and shoulder flexion holding foam roll 10 reps each and min v.c  Prone on elbows lifting chest then reaching with right and left UE's diagonally, followed by exercises  in quadraped, rocking forwards and backwards, lifting alternate arms, then alternate legs, then performing thread the needle, min facilitation/ v.c  Theraband exercises with yellow band for rowing and shoulder extension, 10-15 reps eac, min v.c  02/26/23-Arm bike level 4 for conditioning x 8 mins Supine-Pt held a 3 lbs weight between palms and performed chest press with min facilitation. He attempted shoulder flexion however he was unable to control his right shoulder so task was discontinued. Supine pt was instructed in HEP for chest press and shoulder flexion holding foam roll 10 reps each and min v.c  Pt was instructed to use a paper towel roll at home for these exercises. Prone on elbows lifting chest. Quadraped rocking forwards and backwards then bid dog with therapist performing min facilitation.     02/24/23-Reviewed HEP issued last visit, min v.c for performance, 10-15 reps each exercise. Bird dog exercise was broken down so pt lifted alternate arms x 10 reps then legs x 10 reps instead of together. Arm bike x 8 mins level 3 for conditioning. Pt was educated in memory compensation strategies.   02/20/23- Supine-  exercises for scapular retraction,then chest press and shoulder flexion with cane min v.c . Pt was instructed in HEP, for UE strength and scapular stability,  yellow band issued for the theraband exercises. see pt instructions- 10 reps each exercise and min v.c (Pt practiced getting up and down from floor  safely mod I) Arm bike x 6 mins level 1 for conditioning  02/18/23 - evaluation completed,  Pt performed supine scapular retraction, chest press and shoulder flexion with cane, min v.c and demonstration for shoulder and scapular positioning, seated low range chest press- mirror used to educated pt in positioning.   PATIENT EDUCATION: Education details:see above Person educated: Patient Education method: Explanation, demonstration, handout Education comprehension: verbalized understanding, returned demonstration  HOME EXERCISE PROGRAM:  02/20/23- scapular stability / UE strength   GOALS: Goals reviewed with patient? Yes  SHORT TERM GOALS: Target date: 03/19/23  Pt will be I with Initial HEP Baseline: Goal status: met, 02/26/23 2.  Pt will retrieve a lightweight object at 110 shoulder flexion with LUE Baseline: 105* Goal status: ongoing 03/03/23  3.  Pt will retrieve a lightweight object at 115 shoulder flexion with RUE Baseline: 110* Goal status: ongoing 03/03/23  4.  Pt will increase RUE grip strength by 5 lbs for increased RUE functional use. Baseline:  RUE 55 lbs, LUE 85 lbs Goal status: met, 80 lbs  5.  I with memory/ cognitive compensation strategies.  Goal status: memory strategies issued  6.  Pt will perform basic stovetop cooking with supervision.  Goal status:  ongoing 02/26/23  LONG TERM GOALS: Target date: 05/13/23  I with updated HEP  Goal status: INITIAL  2.  Pt will demonstrate increased activity tolerance as evidenced by performing functional activities in standing x 25 mins without rest or LOB.  Goal status: INITIAL  3.  Pt will retrieve a 5 lbs object at 120 shoulder flexion with LUE with good control Baseline: 105* Goal status: INITIAL   4.   Pt will retrieve a 5 lbs object at 120 shoulder flexion with RUE with good control Baseline: 110* Goal status: INITIAL  5.  Pt will perform basic stovetop cooking/ mod complex home management withmod  I  Goal status: INITIAL  6.  Pt will perform simulated work activities modified independently without LOB  Goal status: INITIAL  ASSESSMENT:  CLINICAL IMPRESSION: Pt is progressing towards goals. He  demonstrates improved strength however he still has significant scapular winging. PERFORMANCE DEFICITS: in functional skills including ADLs, IADLs, coordination, dexterity, proprioception, sensation, ROM, strength, flexibility, Fine motor control, Gross motor control, mobility, balance, endurance, decreased knowledge of precautions, decreased knowledge of use of DME, and UE functional use, cognitive skills including memory and thought, and psychosocial skills including coping strategies, environmental adaptation, habits, interpersonal interactions, and routines and behaviors.   IMPAIRMENTS: are limiting patient from ADLs, IADLs, work, play, leisure, and social participation.   CO-MORBIDITIES: may have co-morbidities  that affects occupational performance. Patient will benefit from skilled OT to address above impairments and improve overall function.  MODIFICATION OR ASSISTANCE TO COMPLETE EVALUATION: No modification of tasks or assist necessary to complete an evaluation.  OT OCCUPATIONAL PROFILE AND HISTORY: Detailed assessment: Review of records and additional review of physical, cognitive, psychosocial history related to current functional performance.  CLINICAL DECISION MAKING: LOW - limited treatment options, no task modification necessary  REHAB POTENTIAL: Good  EVALUATION COMPLEXITY: Low    PLAN:  OT FREQUENCY:  16 visits pus eval  OT DURATION: 12 weeks  PLANNED INTERVENTIONS: self care/ADL training, therapeutic exercise, therapeutic activity, neuromuscular re-education, manual therapy, passive range of motion, ultrasound, moist heat, cryotherapy, contrast bath, patient/family education, cognitive remediation/compensation, energy conservation, coping strategies training, DME  and/or AE instructions, and Re-evaluation  RECOMMENDED OTHER SERVICES: PT  CONSULTED AND AGREED WITH PLAN OF CARE: Patient  PLAN FOR NEXT SESSION:  continue to address proximal strength and core stability   Cynithia Hakimi, OT 03/05/2023, 4:49 PM

## 2023-03-13 ENCOUNTER — Encounter: Payer: Self-pay | Admitting: Occupational Therapy

## 2023-03-13 ENCOUNTER — Ambulatory Visit: Payer: BC Managed Care – PPO | Admitting: Occupational Therapy

## 2023-03-13 DIAGNOSIS — R41844 Frontal lobe and executive function deficit: Secondary | ICD-10-CM

## 2023-03-13 DIAGNOSIS — R2681 Unsteadiness on feet: Secondary | ICD-10-CM

## 2023-03-13 DIAGNOSIS — R41840 Attention and concentration deficit: Secondary | ICD-10-CM

## 2023-03-13 DIAGNOSIS — R2689 Other abnormalities of gait and mobility: Secondary | ICD-10-CM

## 2023-03-13 DIAGNOSIS — M6281 Muscle weakness (generalized): Secondary | ICD-10-CM

## 2023-03-13 NOTE — Therapy (Signed)
OUTPATIENT OCCUPATIONAL THERAPY NEURO treatment  Patient Name: Colton Zhang MRN: [id number] DOB:Nov 17, 2000, 22 y.o., male Today's Date: 03/13/2023  PCP: none REFERRING PROVIDER: Murriel Hopper FNP  END OF SESSION:  OT End of Session - 03/13/23 0809     Visit Number 7    Number of Visits 17    Date for OT Re-Evaluation 05/13/23    Authorization Type [employer]    Authorization Time Period 12 weeks    Authorization - Visit Number 7    Authorization - Number of Visits 17    OT Start Time 0801    OT Stop Time 0843    OT Time Calculation (min) 42 min    Activity Tolerance Patient tolerated treatment well    Behavior During Therapy [facility] for tasks assessed/performed                Past Medical History:  Diagnosis Date   ADD (attention deficit disorder)    Asthma    Past Surgical History:  Procedure Laterality Date   CIRCUMCISION     Patient Active Problem List   Diagnosis Date Noted   Acute hypoxic respiratory failure (HCC) 11/25/2022   Status epilepticus (HCC) 11/25/2022   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   Asthma, chronic 11/18/2022   Hypertensive urgency 11/18/2022   Headache 09/16/2013   Migraine without aura 09/16/2013   ADD (attention deficit disorder) 09/16/2013   Transient alteration of awareness 09/16/2013    ONSET DATE: 01/30/23 referral date  REFERRING DIAG:  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 PMHx significant  for asthma, ADD, DVT No prior seizure history. Several days of hypertension prior to admission. Presented with slurred speech, gait disturbance, blurred vision. Hypertensive to 190 . MRI Brain 6/23 with multifocal cortical edema within both hemispheres, R > L, concerning for viral encephalitis.  patient was started on empiric meningitis coverage (ceftriaxone, vanc, acyclovir). 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 He had prolonged intubation followed by tracheostomy, stayed on vent for over a month. His tracheostomy was reversed last month. He still has a feeding tube, but plan is to follow with surgeon for removal. Pt received therapies at [facility]. PRECAUTIONS: Other: seizures , DVT, peg tube  WEIGHT BEARING RESTRICTIONS: No  PAIN:  Are you having pain? No  FALLS: Has patient fallen in last 6 months? No  LIVING ENVIRONMENT: Lives with: lives with their family Lives in: House/apartment Stairs: Yes: External: Has following equipment at home: Walker - 2 wheeled  PLOF: Independent  PATIENT GOALS: increase arm strength  OBJECTIVE:   HAND DOMINANCE: Left  ADLs: Overall ADLs: increased time due to balance and endurance Transfers/ambulation related to ADLs: Eating: independent Grooming: independent UB Dressing: mod I LB Dressing: mod I Toileting: mod I without assistive device, only uses walker in public Bathing: sponge bath mod I Tub Shower transfers: not performing due to peg, has shower chair  Equipment: Shower seat with back  IADLs: Shopping: dependent for transportation Light housekeeping: has performed light home management Meal  Prep: performs snack prep only has not attempted cooking Community mobility: mod I with walker Medication management: brother loads pillbox, and then pt takes them Financial management: family is assisting currently Handwriting: 100% legible  MOBILITY STATUS:  mod I    ACTIVITY  TOLERANCE: Activity tolerance: 10-15 mins standing, prior to rest break  Posture- Rounded shoulders with scapular winging UPPER EXTREMITY ROM:  remaining A/ROM [facility]  Active ROM Right eval Left eval  Shoulder flexion 110 105  Shoulder abduction 70 70  Shoulder adduction    Shoulder extension    Shoulder internal rotation    Shoulder external rotation    Elbow flexion    Elbow extension    Wrist flexion    Wrist extension    Wrist ulnar deviation    Wrist radial deviation    Wrist pronation    Wrist supination    (Blank rows = not tested)  UPPER EXTREMITY MMT:     MMT Right eval Left eval  Shoulder flexion 3+/5 3+/5  Shoulder abduction    Shoulder adduction    Shoulder extension    Shoulder internal rotation    Shoulder external rotation    Middle trapezius    Lower trapezius    Elbow flexion 4/5 4-/5  Elbow extension 4/5 4-/5  Wrist flexion    Wrist extension    Wrist ulnar deviation    Wrist radial deviation    Wrist pronation    Wrist supination    (Blank rows = not tested)  HAND FUNCTION: Grip strength: Right: 55 lbs; Left: 85 lbs  COORDINATION: 9 Hole Peg test: Right: 25.59 sec; Left: 21.99 sec  SENSATION: WFL   COGNITION: Overall cognitive status:  to be further assessed in a functional context, pt recalled 1/3 items following a short delay. For spelling World backwards pt omitted R  VISION:   VISION ASSESSMENT: Not tested      OBSERVATIONS: Pleasant young man agreeable to therapy   TODAY'S TREATMENT:                                                                                                                              DATE:03/13/23-Arm bike x 6 mins level 4 for conditioning. Supine pt performed chest press and shoulder flexion holding  3 lbs bar,  15 reps each and min v.c  quadraped, rocking forwards and backwards, bird dog over physio ball, min facilitation, tall kneeling rolling ball forwards and backwards for core control, min  facilitation Yellow theraband for shoulder horizontal abduction followed by red theraband for: biceps curls, triceps extension, shoulder flexion and extension bilaterally 10-15 reps each Rolling medium ball up/ down wall for shoulder flexion then rolling ball up wall unilaterally with right and left UE's, min v.c and facilitation. Wall pushups x 10 reps min v.c 03/05/23 Arm bike x 6 mins level 4 for conditioning. Supine pt performed chest press and shoulder flexion holding  2 lbs bar,  10 reps each  and min v.c  quadraped, rocking forwards and backwards, bird dog over physio ball, min facilitation, then performing thread the needle, min facilitation/ v.c , tall kneeling rolling ball forwards and backwards for core control, min facilitation Standing for rowing, shoulder abduction, and biceps curls with weight machine, 5 lbs each UE, 10 reps min facilitation.  UE ranger mid level shoulder flexion  03/03/23 Arm bike x 6 mins level 4 for conditioning. Supine pt performed chest press and shoulder flexion holding foam roll 10 reps each and min v.c  Prone on elbows lifting chest then reaching with right and left UE's diagonally, followed by exercises in quadraped, rocking forwards and backwards, lifting alternate arms, then alternate legs, then performing thread the needle, min facilitation/ v.c  Theraband exercises with yellow band for rowing and shoulder extension, 10-15 reps eac, min v.c  02/26/23-Arm bike level 4 for conditioning x 8 mins Supine-Pt held a 3 lbs weight between palms and performed chest press with min facilitation. He attempted shoulder flexion however he was unable to control his right shoulder so task was discontinued. Supine pt was instructed in HEP for chest press and shoulder flexion holding foam roll 10 reps each and min v.c  Pt was instructed to use a paper towel roll at home for these exercises. Prone on elbows lifting chest. Quadraped rocking forwards and backwards then bid dog  with therapist performing min facilitation.     02/24/23-Reviewed HEP issued last visit, min v.c for performance, 10-15 reps each exercise. Bird dog exercise was broken down so pt lifted alternate arms x 10 reps then legs x 10 reps instead of together. Arm bike x 8 mins level 3 for conditioning. Pt was educated in memory compensation strategies.   02/20/23- Supine-  exercises for scapular retraction,then chest press and shoulder flexion with cane min v.c . Pt was instructed in HEP, for UE strength and scapular stability,  yellow band issued for the theraband exercises. see pt instructions- 10 reps each exercise and min v.c (Pt practiced getting up and down from floor safely mod I) Arm bike x 6 mins level 1 for conditioning  02/18/23 - evaluation completed,  Pt performed supine scapular retraction, chest press and shoulder flexion with cane, min v.c and demonstration for shoulder and scapular positioning, seated low range chest press- mirror used to educated pt in positioning.   PATIENT EDUCATION: Education details: wall push ups Person educated: Patient Education method: Explanation, demonstration, handout Education comprehension: verbalized understanding, returned demonstration  HOME EXERCISE PROGRAM:  02/20/23- scapular stability / UE strength   GOALS: Goals reviewed with patient? Yes  SHORT TERM GOALS: Target date: 03/19/23  Pt will be I with Initial HEP Baseline: Goal status: met, 02/26/23 2.  Pt will retrieve a lightweight object at 110 shoulder flexion with LUE Baseline: 105* Goal status: ongoing 03/03/23  3.  Pt will retrieve a lightweight object at 115 shoulder flexion with RUE Baseline: 110* Goal status: ongoing 03/03/23  4.  Pt will increase RUE grip strength by 5 lbs for increased RUE functional use. Baseline:  RUE 55 lbs, LUE 85 lbs Goal status: met, 80 lbs  5.  I with memory/ cognitive compensation strategies.  Goal status: met, memory strategies issued  6.  Pt  will perform basic stovetop cooking with supervision.  Goal status:  met, per pt report 1017/24  LONG TERM GOALS: Target date: 05/13/23  I with updated HEP  Goal status: INITIAL  2.  Pt will demonstrate increased activity tolerance as evidenced  by performing functional activities in standing x 25 mins without rest or LOB.  Goal status: INITIAL  3.  Pt will retrieve a 5 lbs object at 120 shoulder flexion with LUE with good control Baseline: 105* Goal status: INITIAL   4.   Pt will retrieve a 5 lbs object at 120 shoulder flexion with RUE with good control Baseline: 110* Goal status: INITIAL  5.  Pt will perform basic stovetop cooking/ mod complex home management withmod I  Goal status: INITIAL  6.  Pt will perform simulated work activities modified independently without LOB  Goal status: INITIAL  ASSESSMENT:  CLINICAL IMPRESSION: Pt is progressing towards goals. He demonstrates improved UE strength and control. PERFORMANCE DEFICITS: in functional skills including ADLs, IADLs, coordination, dexterity, proprioception, sensation, ROM, strength, flexibility, Fine motor control, Gross motor control, mobility, balance, endurance, decreased knowledge of precautions, decreased knowledge of use of DME, and UE functional use, cognitive skills including memory and thought, and psychosocial skills including coping strategies, environmental adaptation, habits, interpersonal interactions, and routines and behaviors.   IMPAIRMENTS: are limiting patient from ADLs, IADLs, work, play, leisure, and social participation.   CO-MORBIDITIES: may have co-morbidities  that affects occupational performance. Patient will benefit from skilled OT to address above impairments and improve overall function.  MODIFICATION OR ASSISTANCE TO COMPLETE EVALUATION: No modification of tasks or assist necessary to complete an evaluation.  OT OCCUPATIONAL PROFILE AND HISTORY: Detailed assessment: Review of records  and additional review of physical, cognitive, psychosocial history related to current functional performance.  CLINICAL DECISION MAKING: LOW - limited treatment options, no task modification necessary  REHAB POTENTIAL: Good  EVALUATION COMPLEXITY: Low    PLAN:  OT FREQUENCY:  16 visits pus eval  OT DURATION: 12 weeks  PLANNED INTERVENTIONS: self care/ADL training, therapeutic exercise, therapeutic activity, neuromuscular re-education, manual therapy, passive range of motion, ultrasound, moist heat, cryotherapy, contrast bath, patient/family education, cognitive remediation/compensation, energy conservation, coping strategies training, DME and/or AE instructions, and Re-evaluation  RECOMMENDED OTHER SERVICES: PT  CONSULTED AND AGREED WITH PLAN OF CARE: Patient  PLAN FOR NEXT SESSION:   check shor term goals, continue to address proximal strength and core stability   Fidela Cieslak, OT 03/13/2023, 8:11 AM

## 2023-03-13 NOTE — Patient Instructions (Signed)
Wall Push-Up: Double Arm    Stand in front of wall with bo. Do __1-2_ sets per session.  http://plyo.exer.us/182   Copyright  VHI. All rights reserved.

## 2023-03-17 ENCOUNTER — Ambulatory Visit: Payer: BC Managed Care – PPO | Admitting: Occupational Therapy

## 2023-03-17 DIAGNOSIS — R2681 Unsteadiness on feet: Secondary | ICD-10-CM

## 2023-03-17 DIAGNOSIS — M6281 Muscle weakness (generalized): Secondary | ICD-10-CM

## 2023-03-17 DIAGNOSIS — R2689 Other abnormalities of gait and mobility: Secondary | ICD-10-CM

## 2023-03-17 DIAGNOSIS — R41844 Frontal lobe and executive function deficit: Secondary | ICD-10-CM

## 2023-03-17 DIAGNOSIS — R41840 Attention and concentration deficit: Secondary | ICD-10-CM

## 2023-03-17 NOTE — Patient Instructions (Signed)
    ROM: Abduction - Wand   Holding wand with left hand palm up, push wand directly out to side, leading with other hand palm down, until stretch is felt. Stop at shoulder height Hold 5 seconds. Repeat 10 times per set. Do 2-3 sessions per day.    ROM: Extension - Wand (Standing)    [employer] - Standing   With arms straight, hold cane forward at waist. Raise  to shoulder eight only Hold 3 seconds. Repeat 10 times. Do 2 times per day.

## 2023-03-17 NOTE — Therapy (Signed)
OUTPATIENT OCCUPATIONAL THERAPY NEURO treatment  Patient Name: Colton Zhang MRN: [id number] DOB:11-14-2000, 22 y.o., male Today's Date: 03/17/2023  PCP: none REFERRING PROVIDER: Murriel Hopper FNP  END OF SESSION:  OT End of Session - 03/17/23 1511     Visit Number 8    Number of Visits 17    Date for OT Re-Evaluation 05/13/23    Authorization Type [employer]    Authorization Time Period 12 weeks    Authorization - Visit Number 8    Authorization - Number of Visits 17    OT Start Time 1448    OT Stop Time 1529    OT Time Calculation (min) 41 min    Activity Tolerance Patient tolerated treatment well    Behavior During Therapy WFL for tasks assessed/performed                Past Medical History:  Diagnosis Date   ADD (attention deficit disorder)    Asthma    Past Surgical History:  Procedure Laterality Date   CIRCUMCISION     Patient Active Problem List   Diagnosis Date Noted   Acute hypoxic respiratory failure (HCC) 11/25/2022   Status epilepticus (HCC) 11/25/2022   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   Asthma, chronic 11/18/2022   Hypertensive urgency 11/18/2022   Headache 09/16/2013   Migraine without aura 09/16/2013   ADD (attention deficit disorder) 09/16/2013   Transient alteration of awareness 09/16/2013    ONSET DATE: 01/30/23 referral date  REFERRING DIAG:  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  Z74.09 (ICD-10-CM) - Other reduced mobility  Z78.9 (ICD-10-CM) - Other specified health status    THERAPY DIAG:  Muscle weakness (generalized)  Unsteadiness on feet  Frontal lobe and executive function deficit  Attention and concentration deficit  Other abnormalities of gait and mobility  Rationale for Evaluation and Treatment: Rehabilitation  SUBJECTIVE:   SUBJECTIVE STATEMENT: Pt  reports he is trying to get new insurance Pt accompanied by: self  PERTINENT HISTORY: Per chart review:22 year old man who presented to [facility] 6/23 with HA and  blurry vision. PMHx significant for asthma, ADD, DVT No prior seizure history. Several days of hypertension prior to admission. Presented with slurred speech, gait disturbance, blurred vision. Hypertensive to 190 . MRI Brain 6/23 with multifocal cortical edema within both hemispheres, R > L, concerning for viral encephalitis.  patient was started on empiric meningitis coverage (ceftriaxone, vanc, acyclovir). 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 He had prolonged intubation followed by tracheostomy, stayed on vent for over a month. His tracheostomy was reversed last month. He still has a feeding tube, but plan is to follow with surgeon for removal. Pt received therapies at [facility]. PRECAUTIONS: Other: seizures , DVT, peg tube  WEIGHT BEARING RESTRICTIONS: No  PAIN:  Are you having pain? No  FALLS: Has patient fallen in last 6 months? No  LIVING ENVIRONMENT: Lives with: lives with their family Lives in: House/apartment Stairs: Yes: External: Has following equipment at home: Walker - 2 wheeled  PLOF: Independent  PATIENT GOALS: increase arm strength  OBJECTIVE:   HAND DOMINANCE: Left  ADLs: Overall ADLs: increased time due to balance and endurance Transfers/ambulation related to ADLs: Eating: independent Grooming: independent UB Dressing: mod I LB Dressing: mod I Toileting: mod I without assistive device, only uses walker in public Bathing: sponge bath mod I Tub Shower transfers: not performing due to peg, has shower chair  Equipment: Shower seat with back  IADLs: Shopping: dependent for transportation Light  housekeeping: has performed light home management Meal Prep: performs snack prep only has not attempted cooking Community mobility: mod I with walker Medication management: brother loads pillbox, and then pt takes them Financial management: family is assisting currently Handwriting: 100% legible  MOBILITY STATUS:  mod  I    ACTIVITY TOLERANCE: Activity tolerance: 10-15 mins standing, prior to rest break  Posture- Rounded shoulders with scapular winging UPPER EXTREMITY ROM:  remaining A/ROM [facility]  Active ROM Right eval Left eval  Shoulder flexion 110 105  Shoulder abduction 70 70  Shoulder adduction    Shoulder extension    Shoulder internal rotation    Shoulder external rotation    Elbow flexion    Elbow extension    Wrist flexion    Wrist extension    Wrist ulnar deviation    Wrist radial deviation    Wrist pronation    Wrist supination    (Blank rows = not tested)  UPPER EXTREMITY MMT:     MMT Right eval Left eval  Shoulder flexion 3+/5 3+/5  Shoulder abduction    Shoulder adduction    Shoulder extension    Shoulder internal rotation    Shoulder external rotation    Middle trapezius    Lower trapezius    Elbow flexion 4/5 4-/5  Elbow extension 4/5 4-/5  Wrist flexion    Wrist extension    Wrist ulnar deviation    Wrist radial deviation    Wrist pronation    Wrist supination    (Blank rows = not tested)  HAND FUNCTION: Grip strength: Right: 55 lbs; Left: 85 lbs  COORDINATION: 9 Hole Peg test: Right: 25.59 sec; Left: 21.99 sec  SENSATION: WFL   COGNITION: Overall cognitive status:  to be further assessed in a functional context, pt recalled 1/3 items following a short delay. For spelling World backwards pt omitted R  VISION:   VISION ASSESSMENT: Not tested      OBSERVATIONS: Pleasant young man agreeable to therapy   TODAY'S TREATMENT:                                                                                                                              DATE:03/17/23 Arm bike x 6 mins level 4 for conditioning. Supine pt performed chest press and shoulder flexion holding  3 lbs bar, 2 sets of 10  reps each and min v.c  Cane exercises for shoulder flexion and abduction, 10 reps each, min v.c  quadraped, rocking forwards and backwards, bird dog over  physio ball, min facilitation, tall kneeling rolling ball forwards and backwards for core control, min facilitation Wall pushups x 10 reps min v.c wall slides x 20 reps Discussed reducing frequency as pt has lost his insurance.  03/13/23-Arm bike x 6 mins level 4 for conditioning. Supine pt performed chest press and shoulder flexion holding  3 lbs bar,  15 reps each and min v.c  quadraped, rocking forwards and backwards,  bird dog over physio ball, min facilitation, tall kneeling rolling ball forwards and backwards for core control, min facilitation Yellow theraband for shoulder horizontal abduction followed by red theraband for: biceps curls, triceps extension, shoulder flexion and extension bilaterally 10-15 reps each Rolling medium ball up/ down wall for shoulder flexion then rolling ball up wall unilaterally with right and left UE's, min v.c and facilitation. Wall pushups x 10 reps min v.c 03/05/23 Arm bike x 6 mins level 4 for conditioning. Supine pt performed chest press and shoulder flexion holding  2 lbs bar,  10 reps each and min v.c  quadraped, rocking forwards and backwards, bird dog over physio ball, min facilitation, then performing thread the needle, min facilitation/ v.c , tall kneeling rolling ball forwards and backwards for core control, min facilitation Standing for rowing, shoulder abduction, and biceps curls with weight machine, 5 lbs each UE, 10 reps min facilitation.  UE ranger mid level shoulder flexion  03/03/23 Arm bike x 6 mins level 4 for conditioning. Supine pt performed chest press and shoulder flexion holding foam roll 10 reps each and min v.c  Prone on elbows lifting chest then reaching with right and left UE's diagonally, followed by exercises in quadraped, rocking forwards and backwards, lifting alternate arms, then alternate legs, then performing thread the needle, min facilitation/ v.c  Theraband exercises with yellow band for rowing and shoulder extension, 10-15  reps eac, min v.c  02/26/23-Arm bike level 4 for conditioning x 8 mins Supine-Pt held a 3 lbs weight between palms and performed chest press with min facilitation. He attempted shoulder flexion however he was unable to control his right shoulder so task was discontinued. Supine pt was instructed in HEP for chest press and shoulder flexion holding foam roll 10 reps each and min v.c  Pt was instructed to use a paper towel roll at home for these exercises. Prone on elbows lifting chest. Quadraped rocking forwards and backwards then bid dog with therapist performing min facilitation.     02/24/23-Reviewed HEP issued last visit, min v.c for performance, 10-15 reps each exercise. Bird dog exercise was broken down so pt lifted alternate arms x 10 reps then legs x 10 reps instead of together. Arm bike x 8 mins level 3 for conditioning. Pt was educated in memory compensation strategies.   02/20/23- Supine-  exercises for scapular retraction,then chest press and shoulder flexion with cane min v.c . Pt was instructed in HEP, for UE strength and scapular stability,  yellow band issued for the theraband exercises. see pt instructions- 10 reps each exercise and min v.c (Pt practiced getting up and down from floor safely mod I) Arm bike x 6 mins level 1 for conditioning  02/18/23 - evaluation completed,  Pt performed supine scapular retraction, chest press and shoulder flexion with cane, min v.c and demonstration for shoulder and scapular positioning, seated low range chest press- mirror used to educated pt in positioning.   PATIENT EDUCATION: Education details: wall push ups Person educated: Patient Education method: Explanation, demonstration, handout Education comprehension: verbalized understanding, returned demonstration  HOME EXERCISE PROGRAM:  02/20/23- scapular stability / UE strength   GOALS: Goals reviewed with patient? Yes  SHORT TERM GOALS: Target date: 03/19/23  Pt will be I with  Initial HEP Baseline: Goal status: met, 02/26/23 2.  Pt will retrieve a lightweight object at 110 shoulder flexion with LUE Baseline: 105* Goal status met 120  3.  Pt will retrieve a lightweight object at 115 shoulder flexion with  RUE Baseline: 110* Goal status: met 115  4.  Pt will increase RUE grip strength by 5 lbs for increased RUE functional use. Baseline:  RUE 55 lbs, LUE 85 lbs Goal status: met, 80 lbs  5.  I with memory/ cognitive compensation strategies.  Goal status: met, memory strategies issued  6.  Pt will perform basic stovetop cooking with supervision.  Goal status:  met, per pt report 1017/24  LONG TERM GOALS: Target date: 05/13/23  I with updated HEP  Goal status: INITIAL  2.  Pt will demonstrate increased activity tolerance as evidenced by performing functional activities in standing x 25 mins without rest or LOB.  Goal status: INITIAL  3.  Pt will retrieve a 5 lbs object at 120 shoulder flexion with LUE with good control Baseline: 105* Goal status: INITIAL   4.   Pt will retrieve a 5 lbs object at 120 shoulder flexion with RUE with good control Baseline: 110* Goal status: INITIAL  5.  Pt will perform basic stovetop cooking/ mod complex home management withmod I  Goal status: INITIAL  6.  Pt will perform simulated work activities modified independently without LOB  Goal status: INITIAL  ASSESSMENT:  CLINICAL IMPRESSION: Pt is progressing towards goals. He  met short term goals 2, 3 today. PERFORMANCE DEFICITS: in functional skills including ADLs, IADLs, coordination, dexterity, proprioception, sensation, ROM, strength, flexibility, Fine motor control, Gross motor control, mobility, balance, endurance, decreased knowledge of precautions, decreased knowledge of use of DME, and UE functional use, cognitive skills including memory and thought, and psychosocial skills including coping strategies, environmental adaptation, habits, interpersonal  interactions, and routines and behaviors.   IMPAIRMENTS: are limiting patient from ADLs, IADLs, work, play, leisure, and social participation.   CO-MORBIDITIES: may have co-morbidities  that affects occupational performance. Patient will benefit from skilled OT to address above impairments and improve overall function.  MODIFICATION OR ASSISTANCE TO COMPLETE EVALUATION: No modification of tasks or assist necessary to complete an evaluation.  OT OCCUPATIONAL PROFILE AND HISTORY: Detailed assessment: Review of records and additional review of physical, cognitive, psychosocial history related to current functional performance.  CLINICAL DECISION MAKING: LOW - limited treatment options, no task modification necessary  REHAB POTENTIAL: Good  EVALUATION COMPLEXITY: Low    PLAN:  OT FREQUENCY:  16 visits pus eval  OT DURATION: 12 weeks  PLANNED INTERVENTIONS: self care/ADL training, therapeutic exercise, therapeutic activity, neuromuscular re-education, manual therapy, passive range of motion, ultrasound, moist heat, cryotherapy, contrast bath, patient/family education, cognitive remediation/compensation, energy conservation, coping strategies training, DME and/or AE instructions, and Re-evaluation  RECOMMENDED OTHER SERVICES: PT  CONSULTED AND AGREED WITH PLAN OF CARE: Patient  PLAN FOR NEXT SESSION:    continue to address proximal strength and core stability, pt may reduce frequency as he is now self pay   Pricila Bridge, OT 03/17/2023, 3:13 PM

## 2023-03-19 ENCOUNTER — Ambulatory Visit: Payer: BC Managed Care – PPO | Admitting: Occupational Therapy

## 2023-03-25 ENCOUNTER — Ambulatory Visit: Payer: BC Managed Care – PPO | Admitting: Occupational Therapy

## 2023-04-01 ENCOUNTER — Ambulatory Visit: Payer: Self-pay | Admitting: Occupational Therapy

## 2023-04-08 ENCOUNTER — Ambulatory Visit: Payer: Self-pay | Admitting: Occupational Therapy

## 2023-07-29 ENCOUNTER — Encounter (HOSPITAL_COMMUNITY): Payer: Self-pay

## 2023-07-29 ENCOUNTER — Ambulatory Visit (HOSPITAL_COMMUNITY)
Admission: EM | Admit: 2023-07-29 | Discharge: 2023-07-29 | Disposition: A | Discharge status: Home / Self Care | Payer: Self-pay

## 2023-07-29 DIAGNOSIS — R1114 Bilious vomiting: Secondary | ICD-10-CM

## 2023-07-29 HISTORY — DX: Encephalitis and encephalomyelitis, unspecified: G04.90

## 2023-07-29 LAB — POC COVID19/FLU A&B COMBO
Covid Antigen, POC: NEGATIVE
Influenza A Antigen, POC: NEGATIVE
Influenza B Antigen, POC: NEGATIVE

## 2023-07-29 MED ORDER — ONDANSETRON 4 MG PO TBDP
4.0000 mg | ORAL_TABLET | Freq: Three times a day (TID) | ORAL | 0 refills | Status: AC | PRN
Start: 2023-07-29 — End: ?

## 2023-07-29 MED ORDER — ONDANSETRON 4 MG PO TBDP
ORAL_TABLET | ORAL | Status: AC
  Filled 2023-07-29: qty 1

## 2023-07-29 MED ORDER — ONDANSETRON 4 MG PO TBDP
4.0000 mg | ORAL_TABLET | Freq: Once | ORAL | Status: AC
  Administered 2023-07-29: 4 mg via ORAL

## 2023-07-29 NOTE — Discharge Instructions (Addendum)
  Gastroenteritis -Point-of-care testing for COVID and flu today in [facility] are negative. -Use prescribed ondansetron 4 mg disintegrating tablet as directed every 8 hours for nausea and vomiting symptoms. -Try to replenish fluids by drinking Gatorade or Pedialyte to replenish electrolytes and fluid volume loss. -If symptoms do not improve or if they become worse follow-up in ER immediately for further evaluation and management.

## 2023-07-29 NOTE — ED Triage Notes (Signed)
 Patient reports that he has lost his "taste and smell", has chills, intermittent headache, and fatigue x 3 days.  Patient reports that he has not taken any medication for his symptoms.

## 2023-07-29 NOTE — ED Provider Notes (Signed)
 [facility]    CSN: [id number] Arrival date & time: 07/29/23  1641      History   Chief Complaint Chief Complaint  Patient presents with   Chills   Headache    HPI Colton Zhang is a 23 y.o. male.   Patient reports body aches, chills, loss of taste and smell, headache, fatigue since Saturday.  New onset nausea and vomiting started today.  Patient denies any shortness of breath, chest pain, weakness, cough, dizziness, diarrhea, abdominal pain.  Patient denies any known sick contacts.  Patient reports that his mother was diagnosed with flu approximately a month ago but does not think that that has anything to do with his current illness.  Patient has not been taking any over-the-counter medication to treat symptoms.   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 Authorizing Provider  gabapentin (NEURONTIN) 300 MG capsule Take 300 mg by mouth 3 (three) times daily.   Yes [provider]  ondansetron (ZOFRAN-ODT) 4 MG disintegrating tablet Take 1 tablet (4 mg total) by mouth every 8 (eight) hours as needed for nausea or vomiting. 07/29/23  Yes Carlyn Lemke B, NP  acetaminophen (TYLENOL) 160 MG/5ML solution Place 20.3 mLs (650 mg total) into feeding tube  every 6 (six) hours as needed for fever. 11/27/22   Lynnell Catalan, MD  acetaminophen (TYLENOL) 325 MG tablet Take 2 tablets (650 mg total) by mouth every 4 (four) hours as needed (discomfort or fever). 11/27/22   Lynnell Catalan, MD  albuterol (PROVENTIL) (2.5 MG/3ML) 0.083% nebulizer solution Take 3 mLs (2.5 mg total) by nebulization every 4 (four) hours as needed for wheezing. 11/27/22   Lynnell Catalan, MD  albuterol (PROVENTIL) (2.5 MG/3ML) 0.083% nebulizer solution Take 3 mLs (2.5 mg total) by nebulization once as needed for wheezing or shortness of breath (or anaphylaxis due to Rituxan infusion). 11/27/22   Lynnell Catalan, MD  brivaracetam 200 mg in sodium chloride 0.9 % 50 mL Inject 200 mg into the vein every 12 (twelve) hours. 11/27/22   Lynnell Catalan, MD  Chlorhexidine Gluconate Cloth 2 % PADS Apply 6 each topically daily. 11/27/22   Lynnell Catalan, MD  citrate dextrose (ACD-A ANTICOAGULANT) 0.73-2.45-2.2 GM/100ML solution 1,000 mLs by Other route continuous. 11/27/22   Lynnell Catalan, MD  diphenhydrAMINE (BENADRYL) 25 mg capsule Take 1 capsule (25 mg total) by mouth every 6 (six) hours as needed for itching ( or hives). 11/27/22   Lynnell Catalan, MD  diphenhydrAMINE (BENADRYL) 50 MG/ML injection Inject 1 mL (50 mg total) into the vein once as needed (for cytokine release/hypersensitivity reactions OR anaphylaxis due to Rituxan infusion). 11/27/22   Lynnell Catalan, MD  docusate sodium (COLACE) 100 MG capsule Take  1 capsule (100 mg total) by mouth 2 (two) times daily as needed for mild constipation. 11/27/22   Lynnell Catalan, MD  enoxaparin (LOVENOX) 40 MG/0.4ML injection Inject 0.4 mLs (40 mg total) into the skin daily. 11/28/22   Lynnell Catalan, MD  EPINEPHrine (ADRENALIN) 1 MG/ML SOLN Inject 0.3 mLs (0.3 mg total) into the muscle every 5 (five) minutes as needed (anaphylactic reaction due to Rituxan infusion). 11/27/22   Lynnell Catalan, MD  famotidine (PEPCID) 20-0.9 MG/50ML-% Inject 50 mLs (20 mg total) into the  vein once as needed (for cytokine release/hypersensitivity reactions OR anaphylaxis due to Rituxan infusion). 11/27/22   Lynnell Catalan, MD  fentaNYL (SUBLIMAZE) SOLN Inject 50-100 mcg into the vein every 15 (fifteen) minutes as needed (to maintain RASS & CPOT goal.). 11/27/22   Lynnell Catalan, MD  fentaNYL 10 mcg/ml SOLN infusion Inject 50-200 mcg/hr into the vein continuous. 11/27/22   Lynnell Catalan, MD  hydrALAZINE (APRESOLINE) 20 MG/ML injection Inject 0.5 mLs (10 mg total) into the vein every 6 (six) hours as needed (For systolic blood pressure more than 160 or diastolic blood pressure more than 100). 11/27/22   Lynnell Catalan, MD  ibuprofen (ADVIL) 100 MG/5ML suspension Place 10 mLs (200 mg total) into feeding tube every 6 (six) hours as needed for fever. 11/27/22   Lynnell Catalan, MD  insulin aspart (NOVOLOG) 100 UNIT/ML injection Inject 0-9 Units into the skin every 4 (four) hours. 11/27/22   Lynnell Catalan, MD  labetalol (NORMODYNE) 5 MG/ML injection Inject 4 mLs (20 mg total) into the vein every 3 (three) hours as needed (SBP more than 160 or DBP more than 100). 11/27/22   Agarwala, Daleen Bo, MD  levETIRacetam (KEPPRA) 1500 MG/100ML SOLN Inject 100 mLs (1,500 mg total) into the vein every 12 (twelve) hours. 11/27/22   Lynnell Catalan, MD  magnesium hydroxide (MILK OF MAGNESIA) 400 MG/5ML suspension Take 30 mLs by mouth daily as needed for mild constipation. 11/27/22   Lynnell Catalan, MD  methylPREDNISolone sodium succinate (SOLU-MEDROL) 125 mg/2 mL injection Inject 2 mLs (125 mg total) into the vein once as needed (for cytokine release/hypersensitivity reactions OR anaphylaxis due to Rituxan infusion). 11/27/22   Lynnell Catalan, MD  Mouthwashes (MOUTH RINSE) LIQD solution 15 mLs by Mouth Rinse route every 2 (two) hours. 11/27/22   Lynnell Catalan, MD  Mouthwashes (MOUTH RINSE) LIQD solution 15 mLs by Mouth Rinse route as needed (oral care). 11/27/22   Lynnell Catalan, MD  Nutritional Supplements (FEEDING SUPPLEMENT, VITAL  AF 1.2 CAL,) LIQD Place 1,000 mLs into feeding tube continuous. 11/27/22   Lynnell Catalan, MD  pantoprazole (PROTONIX) 40 MG injection Inject 40 mg into the vein at bedtime. 11/27/22   Lynnell Catalan, MD  PENTobarbital 2,500 mg in dextrose 5 % 250 mL Inject 276 mg/hr into the vein continuous. 11/27/22   Lynnell Catalan, MD  perampanel (FYCOMPA) 8 MG tablet Place 1.5 tablets (12 mg total) into feeding tube daily. 11/28/22   Lynnell Catalan, MD  PHENObarbital (LUMINAL) 130 MG/ML injection Inject 0.75 mLs (97.5 mg total) into the vein 2 (two) times daily. 11/27/22   Lynnell Catalan, MD  polyethylene glycol (MIRALAX / GLYCOLAX) 17 g packet Place 17 g into feeding tube 2 (two) times daily. 11/27/22   Lynnell Catalan, MD  Protein (FEEDING SUPPLEMENT, PROSOURCE TF20,) liquid Place 60 mLs into feeding tube every 4 (four) hours. 11/27/22   Lynnell Catalan, MD  senna-docusate (SENOKOT-S) 8.6-50 MG tablet Place 2 tablets into feeding tube 2 (two) times daily. 11/27/22  Lynnell Catalan, MD  sodium chloride 0.9 % Inject 1,000 mLs into the vein once as needed (for hypotension or anaphylactic reaction due to Rituxan infusion). 11/27/22   Lynnell Catalan, MD  sodium chloride flush (NS) 0.9 % SOLN 10-40 mLs by Intracatheter route every 12 (twelve) hours. 11/27/22   Lynnell Catalan, MD  sodium chloride flush (NS) 0.9 % SOLN 10-40 mLs by Intracatheter route as needed (flush). 11/27/22   Lynnell Catalan, MD  topiramate (TOPAMAX) 100 MG tablet Take 1 tablet (100 mg total) by mouth 2 (two) times daily. 11/27/22   Lynnell Catalan, MD  valproate 1,000 mg in dextrose 5 % 50 mL Inject 1,000 mg into the vein every 8 (eight) hours. 11/27/22   Lynnell Catalan, MD  Water For Irrigation, Sterile (FREE WATER) SOLN Place 300 mLs into feeding tube every 6 (six) hours. 11/27/22   Lynnell Catalan, MD    Family History Family History  Problem Relation Age of Onset   Cirrhosis Father    Migraines Brother    Heart failure Paternal Grandmother    Stroke Paternal  Grandfather     Social History Social History   Tobacco Use   Smoking status: Never   Smokeless tobacco: Never  Vaping Use   Vaping status: Never Used  Substance Use Topics   Alcohol use: Never   Drug use: Never     Allergies   Other   Review of Systems Review of Systems  Neurological:  Positive for headaches.  Refer to HPI for pertinent ROS.   Physical Exam Triage Vital Signs ED Triage Vitals [07/29/23 1809]  Encounter Vitals Group     BP (!) 135/90     Systolic BP Percentile      Diastolic BP Percentile      Pulse Rate 96     Resp 16     Temp 99.6 F (37.6 C)     Temp Source Oral     SpO2 96 %     Weight      Height      Head Circumference      Peak Flow      Pain Score 0     Pain Loc      Pain Education      Exclude from Growth Chart    No data found.  Updated Vital Signs BP (!) 135/90 (BP Location: Right Arm)   Pulse 96   Temp 99.6 F (37.6 C) (Oral)   Resp 16   SpO2 96%   Visual Acuity Right Eye Distance:   Left Eye Distance:   Bilateral Distance:    Right Eye Near:   Left Eye Near:    Bilateral Near:     Physical Exam Vitals and nursing note reviewed.  Constitutional:      General: He is not in acute distress.    Appearance: He is well-developed and normal weight. He is not ill-appearing or toxic-appearing.  HENT:     Head: Normocephalic.     Mouth/Throat:     Mouth: Mucous membranes are moist.     Pharynx: Oropharynx is clear.  Cardiovascular:     Rate and Rhythm: Normal rate.     Heart sounds: Normal heart sounds. No murmur heard. Pulmonary:     Effort: Pulmonary effort is normal. No respiratory distress.     Breath sounds: Normal breath sounds. No stridor. No wheezing, rhonchi or rales.  Abdominal:     Palpations: Abdomen is soft.     Tenderness: There is  no abdominal tenderness.  Skin:    General: Skin is warm and dry.  Neurological:     Mental Status: He is alert and oriented to person, place, and time.   Psychiatric:        Mood and Affect: Mood normal.        Speech: Speech normal.        Behavior: Behavior normal.      UC Treatments / Results  Labs (all labs ordered are listed, but only abnormal results are displayed) Labs Reviewed  POC COVID19/FLU A&B COMBO    EKG   Radiology No results found.  Procedures Procedures (including critical care time)  Medications Ordered in UC Medications  ondansetron (ZOFRAN-ODT) disintegrating tablet 4 mg (4 mg Oral Given 07/29/23 1841)    Initial Impression / Assessment and Plan / UC Course  I have reviewed the triage vital signs and the nursing notes.  Pertinent labs & imaging results that were available during my care of the patient were reviewed by me and considered in my medical decision making (see chart for details).   Gastroenteritis -Point-of-care testing for COVID and flu today in UC are negative. -Use prescribed ondansetron 4 mg disintegrating tablet as directed every 8 hours for nausea and vomiting symptoms. -Try to replenish fluids by drinking Gatorade or Pedialyte to replenish electrolytes and fluid volume loss. -If symptoms do not improve or if they become worse follow-up in ER immediately for further evaluation and management.  Final Clinical Impressions(s) / UC Diagnoses   Final diagnoses:  Bilious vomiting with nausea     Discharge Instructions       Gastroenteritis -Point-of-care testing for COVID and flu today in UC are negative. -Use prescribed ondansetron 4 mg disintegrating tablet as directed every 8 hours for nausea and vomiting symptoms. -Try to replenish fluids by drinking Gatorade or Pedialyte to replenish electrolytes and fluid volume loss. -If symptoms do not improve or if they become worse follow-up in ER immediately for further evaluation and management.     ED Prescriptions     Medication Sig Dispense Auth. Provider   ondansetron (ZOFRAN-ODT) 4 MG disintegrating tablet Take 1 tablet (4  mg total) by mouth every 8 (eight) hours as needed for nausea or vomiting. 20 tablet Isidora Laham B, NP      PDMP not reviewed this encounter.   Pola Corn B, NP 07/29/23 1907

## 2023-07-31 ENCOUNTER — Emergency Department (HOSPITAL_COMMUNITY)
Admission: EM | Admit: 2023-07-31 | Discharge: 2023-08-01 | Disposition: A | Discharge status: Home / Self Care | Payer: Self-pay | Attending: Emergency Medicine | Admitting: Emergency Medicine

## 2023-07-31 ENCOUNTER — Emergency Department (HOSPITAL_COMMUNITY): Payer: Self-pay

## 2023-07-31 ENCOUNTER — Encounter (HOSPITAL_COMMUNITY): Payer: Self-pay

## 2023-07-31 DIAGNOSIS — R197 Diarrhea, unspecified: Secondary | ICD-10-CM | POA: Diagnosis not present

## 2023-07-31 DIAGNOSIS — Z794 Long term (current) use of insulin: Secondary | ICD-10-CM | POA: Diagnosis not present

## 2023-07-31 DIAGNOSIS — R258 Other abnormal involuntary movements: Secondary | ICD-10-CM | POA: Insufficient documentation

## 2023-07-31 DIAGNOSIS — R1031 Right lower quadrant pain: Secondary | ICD-10-CM | POA: Insufficient documentation

## 2023-07-31 DIAGNOSIS — J45909 Unspecified asthma, uncomplicated: Secondary | ICD-10-CM | POA: Insufficient documentation

## 2023-07-31 DIAGNOSIS — R112 Nausea with vomiting, unspecified: Secondary | ICD-10-CM | POA: Insufficient documentation

## 2023-07-31 DIAGNOSIS — R569 Unspecified convulsions: Secondary | ICD-10-CM

## 2023-07-31 DIAGNOSIS — R000 Tachycardia, unspecified: Secondary | ICD-10-CM | POA: Diagnosis not present

## 2023-07-31 LAB — CBC
HCT: 47.5 % (ref 39.0–52.0)
Hemoglobin: 16.2 g/dL (ref 13.0–17.0)
MCH: 26.8 pg (ref 26.0–34.0)
MCHC: 34.1 g/dL (ref 30.0–36.0)
MCV: 78.5 fL — ABNORMAL LOW (ref 80.0–100.0)
Platelets: 197 10*3/uL (ref 150–400)
RBC: 6.05 MIL/uL — ABNORMAL HIGH (ref 4.22–5.81)
RDW: 11.9 % (ref 11.5–15.5)
WBC: 5.8 10*3/uL (ref 4.0–10.5)
nRBC: 0 % (ref 0.0–0.2)

## 2023-07-31 LAB — COMPREHENSIVE METABOLIC PANEL
ALT: 18 U/L (ref 0–44)
AST: 21 U/L (ref 15–41)
Albumin: 4.1 g/dL (ref 3.5–5.0)
Alkaline Phosphatase: 83 U/L (ref 38–126)
Anion gap: 10 (ref 5–15)
BUN: 12 mg/dL (ref 6–20)
CO2: 26 mmol/L (ref 22–32)
Calcium: 9.1 mg/dL (ref 8.9–10.3)
Chloride: 100 mmol/L (ref 98–111)
Creatinine, Ser: 1.21 mg/dL (ref 0.61–1.24)
GFR, Estimated: >60 mL/min (ref >60)
Glucose, Bld: 101 mg/dL — ABNORMAL HIGH (ref 70–99)
Potassium: 3.9 mmol/L (ref 3.5–5.1)
Sodium: 136 mmol/L (ref 135–145)
Total Bilirubin: 0.7 mg/dL (ref 0.0–1.2)
Total Protein: 7.1 g/dL (ref 6.5–8.1)

## 2023-07-31 LAB — LIPASE, BLOOD: Lipase: 22 U/L (ref 11–51)

## 2023-07-31 LAB — I-STAT CHEM 8, ED
BUN: 10 mg/dL (ref 6–20)
Calcium, Ion: 1.1 mmol/L — ABNORMAL LOW (ref 1.15–1.40)
Chloride: 100 mmol/L (ref 98–111)
Creatinine, Ser: 1.3 mg/dL — ABNORMAL HIGH (ref 0.61–1.24)
Glucose, Bld: 98 mg/dL (ref 70–99)
HCT: 44 % (ref 39.0–52.0)
Hemoglobin: 15 g/dL (ref 13.0–17.0)
Potassium: 3.8 mmol/L (ref 3.5–5.1)
Sodium: 139 mmol/L (ref 135–145)
TCO2: 28 mmol/L (ref 22–32)

## 2023-07-31 MED ORDER — SODIUM CHLORIDE 0.9 % IV BOLUS
1000.0000 mL | Freq: Once | INTRAVENOUS | Status: AC
  Administered 2023-07-31: 1000 mL via INTRAVENOUS

## 2023-07-31 MED ORDER — MORPHINE SULFATE (PF) 4 MG/ML IV SOLN
4.0000 mg | Freq: Once | INTRAVENOUS | Status: AC
  Administered 2023-07-31: 4 mg via INTRAVENOUS
  Filled 2023-07-31: qty 1

## 2023-07-31 MED ORDER — CLOBAZAM 10 MG PO TABS
10.0000 mg | ORAL_TABLET | Freq: Once | ORAL | Status: AC
  Administered 2023-07-31: 10 mg via ORAL
  Filled 2023-07-31: qty 1

## 2023-07-31 MED ORDER — IOHEXOL 350 MG/ML SOLN
75.0000 mL | Freq: Once | INTRAVENOUS | Status: AC | PRN
  Administered 2023-07-31: 75 mL via INTRAVENOUS

## 2023-07-31 MED ORDER — PROMETHAZINE HCL 25 MG PO TABS: 25.0000 mg | ORAL_TABLET | Freq: Four times a day (QID) | ORAL | 0 refills | Status: AC | PRN

## 2023-07-31 MED ORDER — LORAZEPAM 2 MG/ML IJ SOLN
INTRAMUSCULAR | Status: AC
  Filled 2023-07-31: qty 1

## 2023-07-31 MED ORDER — ONDANSETRON HCL 4 MG/2ML IJ SOLN
4.0000 mg | Freq: Once | INTRAMUSCULAR | Status: AC
  Administered 2023-07-31: 4 mg via INTRAVENOUS
  Filled 2023-07-31: qty 2

## 2023-07-31 MED ORDER — ACETAMINOPHEN 500 MG PO TABS
1000.0000 mg | ORAL_TABLET | Freq: Once | ORAL | Status: AC
  Administered 2023-07-31: 1000 mg via ORAL
  Filled 2023-07-31: qty 2

## 2023-07-31 MED ORDER — SODIUM CHLORIDE 0.9 % IV SOLN
12.5000 mg | Freq: Once | INTRAVENOUS | Status: AC
  Administered 2023-07-31: 12.5 mg via INTRAVENOUS
  Filled 2023-07-31: qty 0.5

## 2023-07-31 MED ORDER — LORAZEPAM 2 MG/ML IJ SOLN
1.0000 mg | Freq: Once | INTRAMUSCULAR | Status: AC
  Administered 2023-07-31: 1 mg via INTRAVENOUS

## 2023-07-31 MED ORDER — LEVETIRACETAM IN NACL 1000 MG/100ML IV SOLN
1000.0000 mg | Freq: Once | INTRAVENOUS | Status: AC
  Administered 2023-07-31: 1000 mg via INTRAVENOUS
  Filled 2023-07-31: qty 100

## 2023-07-31 MED ORDER — GABAPENTIN 300 MG PO CAPS
300.0000 mg | ORAL_CAPSULE | Freq: Once | ORAL | Status: AC
  Administered 2023-07-31: 300 mg via ORAL
  Filled 2023-07-31: qty 1

## 2023-07-31 NOTE — ED Provider Notes (Signed)
 [place] EMERGENCY DEPARTMENT AT [facility] Provider Note   CSN: [id number] Arrival date & time: 07/31/23  4098     History  Chief Complaint  Patient presents with   Emesis    Colton Zhang is a 23 y.o. male.  The history is provided by the patient, a relative and medical records. No language interpreter was used.  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  For the past 4 days patient has had right lower quadrant abdominal cramping with associate nausea, vomiting, occasional loose stools.  He also endorsed a throbbing headache and now having fever.  He is unable to keep any of his fluids down and feels dehydrated.  He does not endorse any runny nose sneezing or coughing no sore throat.  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  He was seen at urgent care 2 days ago and had negative COVID flu RSV test.  He was prescribed antiemetic and supportive care which did not help.  His brother also noted some facial twitching while patient was talking to a nurse upon arrival.  He is unsure if he has a seizure activity but states that last seizure activities was in July of last year.  No urinary complaint.  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   Home Medications Prior to Admission medications   Medication Sig Start Date End Date Taking? Authorizing Provider  acetaminophen (TYLENOL) 160 MG/5ML solution Place 20.3 mLs (650 mg total) into feeding tube every 6 (six) hours as needed for fever. 11/27/22   Lynnell Catalan, MD  acetaminophen (TYLENOL) 325 MG tablet Take 2 tablets  (650 mg total) by mouth every 4 (four) hours as needed (discomfort or fever). 11/27/22   Lynnell Catalan, MD  albuterol (PROVENTIL) (2.5 MG/3ML) 0.083% nebulizer solution Take 3 mLs (2.5 mg total) by nebulization every 4 (four) hours as needed for wheezing. 11/27/22   Lynnell Catalan, MD  albuterol (PROVENTIL) (2.5 MG/3ML) 0.083% nebulizer solution Take 3 mLs (2.5 mg total) by nebulization once as needed for wheezing or shortness of breath (or anaphylaxis due to Rituxan infusion). 11/27/22   Lynnell Catalan, MD  brivaracetam 200 mg in sodium chloride 0.9 % 50 mL Inject 200 mg into the vein every 12 (twelve) hours. 11/27/22   Lynnell Catalan, MD  Chlorhexidine Gluconate Cloth 2 % PADS Apply 6 each topically daily. 11/27/22   Lynnell Catalan, MD  citrate dextrose (ACD-A ANTICOAGULANT) 0.73-2.45-2.2 GM/100ML solution 1,000 mLs by Other route continuous. 11/27/22   Lynnell Catalan, MD  diphenhydrAMINE (BENADRYL) 25 mg capsule Take 1 capsule (25 mg total) by mouth every 6 (six) hours as needed for itching ( or hives). 11/27/22   Lynnell Catalan, MD  diphenhydrAMINE (BENADRYL) 50 MG/ML injection Inject 1 mL (50 mg total) into the vein once as needed (for cytokine release/hypersensitivity reactions OR anaphylaxis due to Rituxan infusion). 11/27/22   Lynnell Catalan, MD  docusate sodium (COLACE) 100 MG capsule Take 1 capsule (100 mg total) by mouth 2 (two) times daily as needed for mild constipation. 11/27/22   Lynnell Catalan, MD  enoxaparin (LOVENOX) 40 MG/0.4ML injection Inject 0.4  mLs (40 mg total) into the skin daily. 11/28/22   Lynnell Catalan, MD  EPINEPHrine (ADRENALIN) 1 MG/ML SOLN Inject 0.3 mLs (0.3 mg total) into the muscle every 5 (five) minutes as needed (anaphylactic reaction due to Rituxan infusion). 11/27/22   Lynnell Catalan, MD  famotidine (PEPCID) 20-0.9 MG/50ML-% Inject 50 mLs (20 mg total) into the vein once as needed (for cytokine release/hypersensitivity reactions OR anaphylaxis due to Rituxan infusion). 11/27/22    Lynnell Catalan, MD  fentaNYL (SUBLIMAZE) SOLN Inject 50-100 mcg into the vein every 15 (fifteen) minutes as needed (to maintain RASS & CPOT goal.). 11/27/22   Lynnell Catalan, MD  fentaNYL 10 mcg/ml SOLN infusion Inject 50-200 mcg/hr into the vein continuous. 11/27/22   Lynnell Catalan, MD  gabapentin (NEURONTIN) 300 MG capsule Take 300 mg by mouth 3 (three) times daily.    [provider]  hydrALAZINE (APRESOLINE) 20 MG/ML injection Inject 0.5 mLs (10 mg total) into the vein every 6 (six) hours as needed (For systolic blood pressure more than 160 or diastolic blood pressure more than 100). 11/27/22   Lynnell Catalan, MD  ibuprofen (ADVIL) 100 MG/5ML suspension Place 10 mLs (200 mg total) into feeding tube every 6 (six) hours as needed for fever. 11/27/22   Lynnell Catalan, MD  insulin aspart (NOVOLOG) 100 UNIT/ML injection Inject 0-9 Units into the skin every 4 (four) hours. 11/27/22   Lynnell Catalan, MD  labetalol (NORMODYNE) 5 MG/ML injection Inject 4 mLs (20 mg total) into the vein every 3 (three) hours as needed (SBP more than 160 or DBP more than 100). 11/27/22   Agarwala, Daleen Bo, MD  levETIRacetam (KEPPRA) 1500 MG/100ML SOLN Inject 100 mLs (1,500 mg total) into the vein every 12 (twelve) hours. 11/27/22   Lynnell Catalan, MD  magnesium hydroxide (MILK OF MAGNESIA) 400 MG/5ML suspension Take 30 mLs by mouth daily as needed for mild constipation. 11/27/22   Lynnell Catalan, MD  methylPREDNISolone sodium succinate (SOLU-MEDROL) 125 mg/2 mL injection Inject 2 mLs (125 mg total) into the vein once as needed (for cytokine release/hypersensitivity reactions OR anaphylaxis due to Rituxan infusion). 11/27/22   Lynnell Catalan, MD  Mouthwashes (MOUTH RINSE) LIQD solution 15 mLs by Mouth Rinse route every 2 (two) hours. 11/27/22   Lynnell Catalan, MD  Mouthwashes (MOUTH RINSE) LIQD solution 15 mLs by Mouth Rinse route as needed (oral care). 11/27/22   Lynnell Catalan, MD  Nutritional Supplements (FEEDING SUPPLEMENT, VITAL AF 1.2  CAL,) LIQD Place 1,000 mLs into feeding tube continuous. 11/27/22   Lynnell Catalan, MD  ondansetron (ZOFRAN-ODT) 4 MG disintegrating tablet Take 1 tablet (4 mg total) by mouth every 8 (eight) hours as needed for nausea or vomiting. 07/29/23   Reddick, Nicola Girt B, NP  pantoprazole (PROTONIX) 40 MG injection Inject 40 mg into the vein at bedtime. 11/27/22   Lynnell Catalan, MD  PENTobarbital 2,500 mg in dextrose 5 % 250 mL Inject 276 mg/hr into the vein continuous. 11/27/22   Lynnell Catalan, MD  perampanel (FYCOMPA) 8 MG tablet Place 1.5 tablets (12 mg total) into feeding tube daily. 11/28/22   Lynnell Catalan, MD  PHENObarbital (LUMINAL) 130 MG/ML injection Inject 0.75 mLs (97.5 mg total) into the vein 2 (two) times daily. 11/27/22   Lynnell Catalan, MD  polyethylene glycol (MIRALAX / GLYCOLAX) 17 g packet Place 17 g into feeding tube 2 (two) times daily. 11/27/22   Lynnell Catalan, MD  Protein (FEEDING SUPPLEMENT, PROSOURCE TF20,) liquid Place 60 mLs into feeding tube every 4 (four) hours. 11/27/22  Lynnell Catalan, MD  senna-docusate (SENOKOT-S) 8.6-50 MG tablet Place 2 tablets into feeding tube 2 (two) times daily. 11/27/22   Lynnell Catalan, MD  sodium chloride 0.9 % Inject 1,000 mLs into the vein once as needed (for hypotension or anaphylactic reaction due to Rituxan infusion). 11/27/22   Lynnell Catalan, MD  sodium chloride flush (NS) 0.9 % SOLN 10-40 mLs by Intracatheter route every 12 (twelve) hours. 11/27/22   Lynnell Catalan, MD  sodium chloride flush (NS) 0.9 % SOLN 10-40 mLs by Intracatheter route as needed (flush). 11/27/22   Lynnell Catalan, MD  topiramate (TOPAMAX) 100 MG tablet Take 1 tablet (100 mg total) by mouth 2 (two) times daily. 11/27/22   Lynnell Catalan, MD  valproate 1,000 mg in dextrose 5 % 50 mL Inject 1,000 mg into the vein every 8 (eight) hours. 11/27/22   Lynnell Catalan, MD  Water For Irrigation, Sterile (FREE WATER) SOLN Place 300 mLs into feeding tube every 6 (six) hours. 11/27/22   Lynnell Catalan, MD       Allergies    Other    Review of Systems   Review of Systems  Gastrointestinal:  Positive for vomiting.  All other systems reviewed and are negative.   Physical Exam Updated Vital Signs BP (!) 149/102 (BP Location: Right Arm)   Pulse (!) 102   Temp 100.2 F (37.9 C)   Resp 16   SpO2 94%  Physical Exam Vitals and nursing note reviewed.  Constitutional:      General: He is not in acute distress.    Appearance: He is well-developed.     Comments: Patient appears uncomfortable but nontoxic  HENT:     Head: Atraumatic.     Mouth/Throat:     Mouth: Mucous membranes are moist.  Eyes:     Extraocular Movements: Extraocular movements intact.     Conjunctiva/sclera: Conjunctivae normal.     Pupils: Pupils are equal, round, and reactive to light.  Neck:     Comments: No nuchal rigidity Cardiovascular:     Rate and Rhythm: Tachycardia present.     Pulses: Normal pulses.     Heart sounds: Normal heart sounds.  Pulmonary:     Effort: Pulmonary effort is normal.     Breath sounds: Normal breath sounds. No wheezing, rhonchi or rales.  Abdominal:     Palpations: Abdomen is soft.     Tenderness: There is abdominal tenderness (RLQ tenderness without guarding or rebound tenderness).  Musculoskeletal:        General: Normal range of motion.     Cervical back: Normal range of motion and neck supple.     Comments: 5 out of 5 strength to all 4 extremities  Skin:    Findings: No rash.  Neurological:     Mental Status: He is alert and oriented to person, place, and time.     Cranial Nerves: Cranial nerves 2-12 are intact.     Sensory: Sensation is intact.     Motor: Motor function is intact.    ED Results / Procedures / Treatments   Labs (all labs ordered are listed, but only abnormal results are displayed) Labs Reviewed  CBC - Abnormal; Notable for the following components:      Result Value   RBC 6.05 (*)    MCV 78.5 (*)    All other components within normal limits   COMPREHENSIVE METABOLIC PANEL - Abnormal; Notable for the following components:   Glucose, Bld 101 (*)    All  other components within normal limits  I-STAT CHEM 8, ED - Abnormal; Notable for the following components:   Creatinine, Ser 1.30 (*)    Calcium, Ion 1.10 (*)    All other components within normal limits  LIPASE, BLOOD  URINALYSIS, ROUTINE W REFLEX MICROSCOPIC    EKG None  Radiology CT ABDOMEN PELVIS W CONTRAST Result Date: 07/31/2023 CLINICAL DATA:  Right lower quadrant pain and vomiting. EXAM: CT ABDOMEN AND PELVIS WITH CONTRAST TECHNIQUE: Multidetector CT imaging of the abdomen and pelvis was performed using the standard protocol following bolus administration of intravenous contrast. RADIATION DOSE REDUCTION: This exam was performed according to the departmental dose-optimization program which includes automated exposure control, adjustment of the mA and/or kV according to patient size and/or use of iterative reconstruction technique. CONTRAST:  75mL OMNIPAQUE IOHEXOL 350 MG/ML SOLN COMPARISON:  11/19/2022 FINDINGS: Lower chest: No acute findings. Hepatobiliary: No mass visualized on this unenhanced exam. Gallbladder is unremarkable. No evidence of biliary ductal dilatation. Pancreas: No mass or inflammatory process visualized on this unenhanced exam. Spleen:  Within normal limits in size. Adrenals/Urinary tract: No evidence of urolithiasis or hydronephrosis. Unremarkable unopacified urinary bladder. Stomach/Bowel: No evidence of obstruction, inflammatory process, or abnormal fluid collections. Normal appendix visualized. Vascular/Lymphatic: No pathologically enlarged lymph nodes identified. No evidence of abdominal aortic aneurysm. Reproductive:  No mass or other significant abnormality. Other:  None. Musculoskeletal:  No suspicious bone lesions identified. IMPRESSION: Negative. No evidence of appendicitis or other acute findings. Electronically Signed   By: Danae Orleans M.D.   On:  07/31/2023 12:01    Procedures Procedures    Medications Ordered in ED Medications  LORazepam (ATIVAN) 2 MG/ML injection (  Not Given 07/31/23 1257)  morphine (PF) 4 MG/ML injection 4 mg (4 mg Intravenous Given 07/31/23 0743)  ondansetron (ZOFRAN) injection 4 mg (4 mg Intravenous Given 07/31/23 0741)  sodium chloride 0.9 % bolus 1,000 mL (0 mLs Intravenous Stopped 07/31/23 0908)  acetaminophen (TYLENOL) tablet 1,000 mg (1,000 mg Oral Given 07/31/23 0753)  levETIRAcetam (KEPPRA) IVPB 1000 mg/100 mL premix (0 mg Intravenous Stopped 07/31/23 0927)  promethazine (PHENERGAN) 12.5 mg in sodium chloride 0.9 % 50 mL IVPB (0 mg Intravenous Stopped 07/31/23 1011)  iohexol (OMNIPAQUE) 350 MG/ML injection 75 mL (75 mLs Intravenous Contrast Given 07/31/23 1052)  LORazepam (ATIVAN) injection 1 mg (1 mg Intravenous Given 07/31/23 1236)  cloBAZam (ONFI) tablet 10 mg (10 mg Oral Given 07/31/23 1256)  gabapentin (NEURONTIN) capsule 300 mg (300 mg Oral Given 07/31/23 1255)    ED Course/ Medical Decision Making/ A&P                                 Medical Decision Making Amount and/or Complexity of Data Reviewed Labs: ordered. Radiology: ordered.  Risk OTC drugs. Prescription drug management.   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  For the past 4 days patient has had right lower quadrant abdominal cramping with associate nausea, vomiting, occasional loose stools.  He also endorsed a throbbing headache and now having fever.  He is unable to keep any of his fluids down and feels dehydrated.  He does not endorse any runny nose sneezing or coughing no sore  throat.  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  He was seen  at urgent care 2 days ago and had negative COVID flu RSV test.  He was prescribed antiemetic and supportive care which did not help.  His brother also noted some facial twitching while patient was talking to a nurse upon arrival.  He is unsure if he has a seizure activity but states that last seizure activities was in July of last year.  No urinary complaint.  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    Exam notable for right lower quadrant tenderness without guarding or rebound tenderness.  Patient exhibit no nuchal rigidity.  He has equal strength throughout.  He is mentating appropriately.  He is warm to the touch and is mildly tachycardic.  Vitals are notable for an oral temperature of 100.2, heart rate of 102, O2 sats of 94% on room air.  -Labs ordered, independently viewed and interpreted by me.  Labs remarkable for Cr. 1.3, IVF given -The patient was maintained on a cardiac monitor.  I personally viewed and interpreted the cardiac monitored which showed an underlying rhythm of: NSR -Imaging independently viewed and interpreted by me and I agree with radiologist's interpretation.  Result remarkable for abd/pelvis CT without acute finding, no appendicitis.  -This patient presents to the ED for concern of abd pain, this involves an extensive number of treatment options, and is a complaint that carries with it a high risk of complications and morbidity.  The differential diagnosis includes viral GI, covid, flu, rsv, pneumonia, appendicitis, colitis, diverticulitis, cholecystitis, seizure, encephalitis -Co morbidities that complicate the patient evaluation includes autoimmune encephalitis, seizures -Treatment includes keppra, ativan, clozapam, gabapentin -Reevaluation of the patient after these medicines  showed that the patient improved -PCP office notes or outside notes reviewed -Discussion with specialist neurolgist Dr. Jobe Marker -Escalation to admission/observation considered: patients feels much better, is comfortable with discharge, and will follow up with neurology -Prescription medication considered, patient comfortable with phenergan -Social Determinant of Health considered   12:40 PM CT scan of the abdomen pelvis show no acute finding.  Patient has several episodes of facial twitching without any loss of consciousness during this ER visit most recent was approximately 10 minutes ago.  Patient was loaded with 1000 mg of Keppra prior to this episode.  I have reached out and consulted with neurologist Dr. [name] who felt his symptoms likely seizures in the setting of inability to keep it with his home medications due to nausea and vomiting.  Plan is to provide symptomatic control and give patient his p.o. medication and if he can tolerate send then he can go home with antiemetic and continue with his treatment along with outpatient follow-up.  If he is unable to tolerate p.o. he will need to be admitted for IV medication.  Patient able to tolerate p.o., felt better.  He was able to take his home medication.  Plan to have patient follow-up outpatient with his neurology for further care.  Patient instructed to resume taking his antiepileptic medication as previously prescribed.  Phenergan prescribed to use as needed for his nausea and vomiting.  I suspect his symptoms likely of viral illness.  Does not think LP is indicated at  this time as patient exhibits no nuchal rigidity        Final Clinical Impression(s) / ED Diagnoses Final diagnoses:  Nausea vomiting and diarrhea  Abdominal pain, RLQ  Observed seizure-like activity (HCC)    Rx / DC Orders ED Discharge Orders          Ordered    promethazine (PHENERGAN) 25 MG tablet  Every 6 hours PRN        07/31/23 1501               Fayrene Helper, PA-C 07/31/23 1522    Royanne Foots, DO 08/07/23 1431

## 2023-07-31 NOTE — ED Notes (Signed)
 Patient transported to CT

## 2023-07-31 NOTE — ED Notes (Signed)
Rainbow draw sent to lab

## 2023-07-31 NOTE — ED Notes (Signed)
 During placement of the IV, pt had some facial twitching and slurring of his words with some jumbling of the words as well. Episode only lasted about 15 seconds and fully resolved.  Brother at bedside states that this is "what his seizures look like."

## 2023-07-31 NOTE — ED Notes (Signed)
Lt green tube sent to lab

## 2023-07-31 NOTE — ED Triage Notes (Signed)
 Pt has had vomiting x 5 days. He reports that he averages 2-3 episodes daily and has abdominal pain immediately after. Denies pain at this time. Denies diarrhea/constipation.

## 2023-07-31 NOTE — ED Notes (Signed)
 Pt brother opened exam room door and asked for help. He states patient started having a "full body seizure" Pt was found to be  dazed and drooling but speaking. Pt does have some left side facial droop that he had previously with the face twtiching episodes he was having. PA Laveda Norman advised and at bedside.

## 2023-07-31 NOTE — Discharge Instructions (Addendum)
 You have been evaluated for your symptoms.  Your abdominal discomfort along with nausea vomiting diarrhea is likely due to a viral gastroenteritis.  The CT scan of your abdomen pelvis today did not show any concerning finding.  You may take Phenergan as needed for nausea.  Please resume taking your antiseizure medication as previously prescribed and follow-up closely with your neurologist for further care.  Return to ER if your symptoms worsen or if you have other concern.

## 2023-08-01 LAB — URINALYSIS, ROUTINE W REFLEX MICROSCOPIC
Bacteria, UA: NONE SEEN
Bilirubin Urine: NEGATIVE
Glucose, UA: NEGATIVE mg/dL
Hgb urine dipstick: NEGATIVE
Ketones, ur: 20 mg/dL — AB
Leukocytes,Ua: NEGATIVE
Nitrite: NEGATIVE
Protein, ur: 30 mg/dL — AB
Specific Gravity, Urine: >1.046 — ABNORMAL HIGH (ref 1.005–1.030)
pH: 6 (ref 5.0–8.0)

## 2023-09-29 NOTE — Therapy (Signed)
 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9 (ICD-10-CM) - Other specified health status  R56.9 (ICD-10-CM) - Unspecified convulsions    THERAPY DIAG:  Muscle weakness (generalized)  Unsteadiness on feet  Frontal lobe and executive function deficit  Unsteady gait  Other abnormalities of gait and mobility  Rationale for Evaluation and Treatment: Rehabilitation  SUBJECTIVE:                                                                                                                                                                                             SUBJECTIVE STATEMENT: Doing pretty good. Brother reports he was independent for the most part, except medicine management.   Pt accompanied by: family member brother   PERTINENT HISTORY: Colton Zhang is a 23 y.o.  left-hand-dominant male with PMHx significant for asthma, ADD, chronic hepatitis B, MDR Pseudomonas infections, autoimmune encephalitis, and seizure who was admitted to [facility] on 08/01/2023 with increased seizure frequency. He was transferred to [facility] on 08/03/23 for management of focal status epilepticus. He required intubation with escalation of antiepileptic agents. He was started on rituximab  for autoimmune encephalitis and then given cyclophosphamide. He was taken to the OR on 08/22/2023 by Dr. Dave Erie for percutaneous tracheostomy and PEG. The patient's hospital course was complicated by MSSA ventilator associated pneumonia, acute  left femoral DVT, macular rash on chest, transaminitis, pancytopenia, central diabetes insipidus, stage II sacral pressure ulcer, and hypotension. The patient made gradual progress and began to work in therapy. Independence was limited by decreased activity tolerance, impaired balance, decreased strength, and impaired coordination. He was admitted to inpatient rehabilitation for conference of therapy and continued medical management.    PAIN:  Are you having pain? No  PRECAUTIONS: None  RED FLAGS: None   WEIGHT BEARING RESTRICTIONS: No  FALLS: Has patient fallen in last 6 months? No  LIVING ENVIRONMENT: Lives with: lives with their family Lives in: House/apartment Stairs: Yes: External: 3 steps; on left going up Has following equipment at home: None  PLOF: Independent with basic ADLs  PATIENT GOALS: "not really"   OBJECTIVE:  Note: Objective measures were completed at Evaluation unless otherwise noted.  DIAGNOSTIC FINDINGS: FINDINGS: Brain: No acute intracranial hemorrhage. No focal mass lesion. No CT evidence of acute infarction. No midline shift or mass effect. No hydrocephalus. Basilar cisterns are patent.   Vascular: No hyperdense vessel or unexpected calcification.   Skull: Normal. Negative for fracture or focal lesion.    Sinuses/Orbits: Paranasal sinuses and mastoid air cells are clear. Orbits are clear.   Other: None.   IMPRESSION: No acute intracranial findings  COGNITION: Overall cognitive status: History of cognitive impairments - at baseline   SENSATION: WFL  COORDINATION: Decreased coordination   MUSCLE LENGTH: Hamstrings: some HS tightness BLE   POSTURE: flexed trunk  and leaned forward at the hips  LOWER EXTREMITY ROM:   WFL   LOWER EXTREMITY MMT:    MMT Right Eval Left Eval  Hip flexion 4+ 4+  Hip extension    Hip abduction 4 4  Hip adduction 5 5  Hip internal rotation    Hip external rotation    Knee flexion 5 5  Knee extension 3+ 3+  Ankle dorsiflexion    Ankle plantarflexion    Ankle inversion    Ankle eversion    (Blank rows = not tested)   TRANSFERS: Sit to stand: unable to do from chair without UE push off  Assistive device utilized: None     Stand to sit: Modified independence  Assistive device utilized: None     Chair to chair: Modified independence  Assistive device utilized: None       STAIRS: Not tested GAIT: Findings: Gait Characteristics: ataxic, decreased trunk rotation, trunk flexed, narrow BOS, poor foot clearance- Right, and poor foot clearance- Left, Distance walked: in clinic distances, and Comments: some instability noted with gait, quad weakness  FUNCTIONAL TESTS:  5 times sit to stand: unable to do from chair, from elevated mat table 15s some LOB backwards Timed up and go (TUG): 13s Berg Balance Scale: 47/56                                                                                                                              TREATMENT DATE: EVAL  09/30/23    PATIENT EDUCATION: Education details: POC and HEP Person educated: Patient Education method: Medical illustrator Education comprehension: verbalized understanding  HOME EXERCISE PROGRAM: Access Code: [email or web address] URL: [email or web address] Date:  09/30/2023 Prepared by: Donavon Fudge  Exercises - Sit to Stand  - 1 x daily - 7 x weekly - 2 sets - 10 reps - Single Leg Stance  - 1 x daily - 7 x weekly - 5 reps - 10 hold - Tandem Stance in Corner  - 1 x daily - 7 x weekly - 3 reps - 15 hold - Seated Knee Extension with Resistance  - 1 x daily - 7 x weekly - 2 sets - 10 reps - Seated Hip Abduction with Resistance  - 1 x daily - 7 x weekly - 2 sets - 10 reps  GOALS: Goals reviewed with patient? Yes  SHORT TERM GOALS: Target date: 10/28/23  Patient will be independent with initial HEP. Baseline: given 09/30/23 Goal status: INITIAL  2.  Patient will demonstrate decreased fall risk by scoring < 12 sec on TUG. Baseline: 13s Goal status: INITIAL  3.  Patient will demonstrate SLS >10s on firm and foam surfaces BLE Baseline: 5-10s on firm Goal status: INITIAL   LONG TERM GOALS: Target date: 11/25/23  Patient will be independent with advanced/ongoing HEP to improve outcomes and carryover.  Baseline:  Goal status: INITIAL  2.  Patient will be able to do tandem hold for 30s bilaterally.  Baseline: needs help to step and holds for 15s Goal status: INITIAL  3.  Patient will be able to complete 10 consecutive tandem steps Baseline: 3 steps before LOB Goal status: INITIAL   4.  Patient will demonstrate improved functional LE strength as demonstrated by 5xSTS from chair height or lowest level mat <14s. Baseline: 15s from elevated mat w/LOB, unable to do from chair height  Goal status: INITIAL  5.  Patient will increase quad strength to 5/5  Baseline: 3+ BLE Goal status: INITIAL   ASSESSMENT:  CLINICAL IMPRESSION: Patient is a 23 y.o. male who was seen today for physical therapy evaluation and treatment for encephalitis. He was in the hospital for over a month and attending inpatient rehab to address his reduced mobility and unspecified convulsions. Pt does seem to have some decreased safety awareness and is flat. He feels that he is  back to normal. However, with testing done today he presents with decreased muscle strength in his LE and has some balance impairments. He will benefit from PT to address his strength and balance deficits to improve his stability and mobility to decrease his risk for falls.    OBJECTIVE IMPAIRMENTS: Abnormal gait, decreased balance, decreased coordination, decreased strength, and decreased safety awareness.   ACTIVITY LIMITATIONS: locomotion level    PERSONAL FACTORS: Age, Behavior pattern, Fitness, Past/current experiences, Time since onset of injury/illness/exacerbation, and 3+ comorbidities: ADD, encephalitis, trach, PEG  are also affecting patient's functional outcome.   REHAB POTENTIAL: Good  CLINICAL DECISION MAKING: Stable/uncomplicated  EVALUATION COMPLEXITY: Low  PLAN:  PT FREQUENCY: 1-2x/week  PT DURATION: 8 weeks  PLANNED INTERVENTIONS: 97110-Therapeutic exercises, 97530- Therapeutic activity, [id number]- Neuromuscular re-education, 97535- Self Care, 13244- Manual therapy, [phone]- Gait training, Patient/Family education, Balance training, Stair training, Cryotherapy, and Moist heat  PLAN FOR NEXT SESSION: strength and balance training   Donavon Fudge, PT 09/30/2023, 12:29 PM

## 2023-09-30 ENCOUNTER — Encounter: Payer: Self-pay | Admitting: Occupational Therapy

## 2023-09-30 ENCOUNTER — Encounter: Payer: Self-pay | Admitting: Speech Pathology

## 2023-09-30 ENCOUNTER — Ambulatory Visit: Admitting: Occupational Therapy

## 2023-09-30 ENCOUNTER — Ambulatory Visit: Admitting: Speech Pathology

## 2023-09-30 ENCOUNTER — Ambulatory Visit: Attending: Physical Medicine & Rehabilitation

## 2023-09-30 DIAGNOSIS — R41844 Frontal lobe and executive function deficit: Secondary | ICD-10-CM

## 2023-09-30 DIAGNOSIS — R2689 Other abnormalities of gait and mobility: Secondary | ICD-10-CM | POA: Insufficient documentation

## 2023-09-30 DIAGNOSIS — M6281 Muscle weakness (generalized): Secondary | ICD-10-CM | POA: Insufficient documentation

## 2023-09-30 DIAGNOSIS — R278 Other lack of coordination: Secondary | ICD-10-CM | POA: Diagnosis present

## 2023-09-30 DIAGNOSIS — R41841 Cognitive communication deficit: Secondary | ICD-10-CM | POA: Insufficient documentation

## 2023-09-30 DIAGNOSIS — R2681 Unsteadiness on feet: Secondary | ICD-10-CM | POA: Insufficient documentation

## 2023-09-30 DIAGNOSIS — R41840 Attention and concentration deficit: Secondary | ICD-10-CM

## 2023-09-30 NOTE — Therapy (Signed)
 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    Past Surgical History:  Procedure Laterality Date   CIRCUMCISION     GASTROSTOMY W/ FEEDING TUBE     TRACHEOSTOMY     Patient Active Problem List   Diagnosis Date Noted   Acute hypoxic respiratory failure (HCC) 11/25/2022   Status epilepticus (HCC) 11/25/2022   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  Pt accompanied by: self and family member; Nutritional therapist (brother)  PERTINENT HISTORY: Per chart review: Colton Zhang is a 23 y.o. 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   PAIN:  Are you having pain? No  FALLS: Has patient fallen in last 6 months?  No  LIVING ENVIRONMENT: Lives with: lives with their family (son) Lives in:  House/apartment  PLOF:  Level of assistance: Independent with ADLs, Independent with IADLs Employment: Environmental education officer employment; [employer]house   Completed High School   PATIENT GOALS:   OBJECTIVE:  Note: Objective measures were completed at Evaluation unless otherwise noted.  DIAGNOSTIC FINDINGS: Per chart review (most recent):  CT Head Wo Contrast 07/31/23 IMPRESSION: No acute intracranial findings     Electronically Signed   By: Deboraha Fallow M.D.   On: 07/31/2023 16:16  COGNITION: Overall cognitive status: Impaired Areas of impairment:  Attention: Impaired: Alternating, Divided Memory: Impaired: Short term Prospective Awareness: Impaired: Emergent Executive function: Impaired: Organization, Planning, Error awareness, and Self-correction Functional deficits: Pt reports he feels "back to baseline". Brother reports seeing some impairments at home re: forgetting information  COGNITIVE COMMUNICATION: Following directions: Follows multi-step commands inconsistently  Auditory comprehension: Impaired: suspect poor attention is contributing Verbal expression: WFL Functional communication: Impaired: Brother reports pt forgetting details of instructions/conversations  ORAL MOTOR EXAMINATION: Overall status: WFL Comments:   Pt with capped TRACH and PEG tube. Voice WNL at this time. Reports no difficulty with swallowing at this time and on a regular diet. To reassess if changes post trach removal.  STANDARDIZED ASSESSMENTS: Initiated CLQT - to complete next session  PATIENT REPORTED OUTCOME MEASURES (PROM): To complete cognitive PROMs  TREATMENT DATE:     PATIENT EDUCATION: Education details: Cognitive-communication and SLP role Person educated: Patient and brother Education method: Explanation Education comprehension: verbalized understanding and needs  further education   GOALS: Goals reviewed with patient? Yes  SHORT TERM GOALS: Target date: 11/01/23  Complete PROMs Baseline: Goal status: INITIAL  2.  Complete CLQT and update goals Baseline:  Goal status: INITIAL  3.   Baseline:  Goal status: INITIAL  4.   Baseline:  Goal status: INITIAL  5.   Baseline:  Goal status: INITIAL  6.   Baseline:  Goal status: INITIAL  LONG TERM GOALS: Target date: 12/01/23  Improve score on PROMs Baseline:  Goal status: INITIAL  2.   Baseline:  Goal status: INITIAL  3.   Baseline:  Goal status: INITIAL  4.   Baseline:  Goal status: INITIAL  5.   Baseline:  Goal status: INITIAL  6.   Baseline:  Goal status: INITIAL  ASSESSMENT:  CLINICAL IMPRESSION: Pt is a 23 yo male who presents to [facility] for evaluation with complex medical history, specifically autoimmune encephalitis and seizures. Pt with tracheostomy and PEG. Pt trach is capped and PEG is not being used. Voice WNL at this time. Reports no difficulty with swallowing at this time and tolerating regular diet. To reassess if changes post trach removal. Pt endorses feeling "like his normal self"; Brother reports he feels like he has trouble remembering details and trouble with planning and organization. Reports increased lack of motivation to complete tasks. No light or sound sensitivity. He reports he has had increase in mood swings - mostly surrounding his trach. He reports feeling re: irritable, anxious, and depressed. Affect was noted to be "flat" this session; though both brothers report no observable personality change. Pt was assessed using CLQT - to complete next session. SLP observed impaired awareness of deficits, impaired error awareness, difficulty following directions and slowed processing. SLP rec skilled ST services to address cognitive-communication impairment to maximize functional independence.    OBJECTIVE IMPAIRMENTS: include attention, memory, awareness,  and executive functioning. These impairments are limiting patient from return to work, managing medications, managing appointments, managing finances, household responsibilities, and effectively communicating at home and in community. Factors affecting potential to achieve goals and functional outcome are  awareness of impairments .Yaviel Aas Patient will benefit from skilled SLP services to address above impairments and improve overall function.  REHAB POTENTIAL: Good  PLAN:  SLP FREQUENCY: 1-2x/week  SLP DURATION: 8 weeks  PLANNED INTERVENTIONS: Environmental controls, Cueing hierachy, Internal/external aids, Functional tasks, SLP instruction and feedback, Compensatory strategies, Patient/family education, and [id number] Treatment of speech (30 or 45 min)     [employer], CCC-SLP 09/30/2023, 10:26 AM

## 2023-09-30 NOTE — Therapy (Signed)
 OUTPATIENT OCCUPATIONAL THERAPY NEURO EVALUATION  Patient Name: Colton Zhang MRN: [id number] DOB:14-Oct-2000, 23 y.o., male Today's Date: 09/30/2023  PCP: Velton Gibbon PA-C REFERRING PROVIDER: Dr. 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    Past Surgical History:  Procedure Laterality Date   CIRCUMCISION     GASTROSTOMY W/ FEEDING TUBE     TRACHEOSTOMY     Patient Active Problem List   Diagnosis Date Noted   Acute hypoxic respiratory failure (HCC) 11/25/2022   Status epilepticus (HCC) 11/25/2022   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   Asthma, chronic 11/18/2022   Hypertensive urgency 11/18/2022   Headache 09/16/2013   Migraine without aura 09/16/2013   ADD (attention deficit disorder) 09/16/2013   Transient alteration of awareness 09/16/2013    ONSET DATE: 09/24/23  REFERRING DIAG:  Diagnosis  Z74.09 (ICD-10-CM) - Other reduced mobility  Z78.9 (ICD-10-CM) - Other specified health status  G04.81 (ICD-10-CM) - Other encephalitis and encephalomyelitis    THERAPY DIAG:  Muscle weakness (generalized) - Plan: Ot plan of care cert/re-cert  Unsteadiness on feet - Plan: Ot plan of care cert/re-cert  Frontal lobe and executive function deficit - Plan: Ot plan of care cert/re-cert  Attention and concentration deficit - Plan: Ot plan of care cert/re-cert  Other abnormalities of gait and mobility - Plan: Ot plan of care cert/re-cert  Other lack of coordination - Plan: Ot plan of care cert/re-cert  Rationale for Evaluation and Treatment: Rehabilitation  SUBJECTIVE:    SUBJECTIVE STATEMENT: Pt eports his right arm is weaker Pt accompanied by: family member  PERTINENT HISTORY: Colton Zhang is a 23 y.o. left-hand-dominant male with PMHx significant for asthma, ADD, chronic hepatitis B, MDR Pseudomonas infections, autoimmune encephalitis, and seizure who was admitted to [facility] on 08/01/2023 with increased seizure frequency. He was transferred to [facility] on 08/03/23 for management of focal status epilepticus. He required intubation with escalation of antiepileptic agents. He was started on rituximab  for autoimmune encephalitis and then given cyclophosphamide. He was taken to the OR on 08/22/2023 by Dr. Dave Erie for percutaneous tracheostomy and PEG. The patient's hospital course was complicated by MSSA ventilator associated pneumonia, acute left femoral DVT, macular rash on chest, transaminitis, pancytopenia, central diabetes insipidus, stage II sacral pressure ulcer, and hypotension.  PRECAUTIONS: Fall, no driving due to seizures, PEG, capped trach  WEIGHT BEARING RESTRICTIONS: No  PAIN:  Are you having pain? No  FALLS: Has patient fallen in last 6 months? No  LIVING ENVIRONMENT: Lives with: lives with their family   PLOF: Independent  PATIENT GOALS: improve UE strength  OBJECTIVE:  Note: Objective measures were completed at Evaluation unless otherwise noted.  HAND DOMINANCE: Left  ADLs: Overall ADLs: mod I with all basic ADLs Transfers/ambulation related to ADLs: Eating: mod I  Tub Shower transfers: mod I steps into tub   Pt was working in a warehouse prior to hospitalization  MOBILITY STATUS: Independent  POSTURE COMMENTS:  rounded shoulders and forward head   ACTIVITY TOLERANCE: Activity tolerance: decreased activity tolerance overall   UPPER EXTREMITY ROM:  LUE WFLs  Active ROM Right eval Left eval  Shoulder flexion 105   Shoulder abduction 70   Shoulder adduction    Shoulder extension    Shoulder  internal rotation    Shoulder external rotation    Elbow flexion WFL   Elbow extension -15   Wrist flexion    Wrist extension    Wrist ulnar deviation    Wrist radial deviation    Wrist pronation WFL   Wrist supination WFL   (Blank rows = not tested)  UPPER EXTREMITY MMT:     MMT Right eval Left eval  Shoulder flexion 3+/5 4-/5  Shoulder abduction    Shoulder adduction    Shoulder extension    Shoulder internal rotation    Shoulder external rotation    Middle trapezius    Lower trapezius    Elbow flexion    Elbow extension    Wrist flexion    Wrist extension    Wrist ulnar deviation    Wrist radial deviation    Wrist pronation    Wrist supination    (Blank rows = not tested)  HAND FUNCTION: Grip strength: Right: 72 lbs; Left: 90 lbs  COORDINATION: 9 Hole Peg test: Right: 21.16 sec; Left: 26.08, 25.53 with drops sec  SENSATION: WFL      COGNITION: Overall cognitive status: Impaired, pt's brother reports delayed processing speed. Pt with decreased awareness of higher level cognitve deficits.  Pt recalled 3/3/ words following a dealy. He completed Trail making B correctly, however pt previously perfromed alternating attention test with ST in prior session and did not complete. OT will further assess alternating attention in a functional context. Pt was unable to spell WORLD backwards correctly, he transposed o and r.  VISION ASSESSMENT: Not tested- pt denies visual changes hx of diplopia with onset of encephalitis, pt reports this has resolved Bells Test 34/35 items located   OBSERVATIONS: Pt with falt affect accompanied by his brother                                                                                                                             TREATMENT DATE: 09/30/23-eval only         PATIENT EDUCATION: Education details: role of OT and potential goals Person educated: Patient and brother Education method: Explanation Education  comprehension: verbalized understanding  HOME EXERCISE PROGRAM: not issued yet   GOALS: Goals reviewed with patient? Yes  SHORT TERM GOALS: Target date: 10/31/23  I with inital HEP  Goal status: INITIAL  2.  Pt will demonstrate ability to retreive a lightweight object at 110* shoulder flexion with RUE Baseline:  Goal status: INITIAL  3.  Pt will increase RUE grip strength by by 10 lbs for increased RUE functional use.  Goal status: INITIAL  4.  Pt will perform an alternating attention task with 90% or better accuracy in rep for work activities.  Goal status: INITIAL  5.  Pt will improve LUE 9 hole peg test score by 3 secs without drops for increased ease with ADLs.  Goal status: INITIAL    LONG TERM GOALS: Target date: 12/23/23  I with updated HEP  Goal status: INITIAL  2.  Pt will demonstrate ability to retreive a  5 lbs object at 115* shoulder flexion with RUE demonstrating good control.  Goal status: INITIAL  3.  Pt will demonstrate ability to perfom a physical and cognitve task simultaneously with 90% or better accuracy in prep for eventual driving and return to work.  Goal status: INITIAL  4.  Pt will perfrom simulated work activities mod I demonstrating good safety and positioning.  Goal status: INITIAL  5.  Pt will perform prior level of home management and cooking mod I  Goal status: INITIAL    ASSESSMENT:  CLINICAL IMPRESSION: Pt is a 23 y.o male with PMHx significant for asthma, ADD, chronic hepatitis B, MDR Pseudomonas infections, autoimmune encephalitis, and seizure who was admitted to [facility] on 08/01/2023 with increased seizure frequency. He was transferred to [facility] on 08/03/23 for management of focal status epilepticus. He required intubation with escalation of antiepileptic agents. He was started on rituximab  for autoimmune encephalitis and then given cyclophosphamide. He was taken to the OR on 08/22/2023 by Dr. Dave Erie for  percutaneous tracheostomy and PEG. The patient's hospital course was complicated by MSSA ventilator associated pneumonia, acute left femoral DVT, macular rash on chest, transaminitis, pancytopenia, central diabetes insipidus, stage II sacral pressure ulcer, and hypotension.Pt will benefit from skilled occupational therapy to address the performance deficits below in order to maximize pt's safety and I with ADLs/IADLS.PERFORMANCE DEFICITS: in functional skills including ADLs, IADLs, coordination, dexterity, ROM, strength, flexibility, Fine motor control, Gross motor control, mobility, balance, endurance, decreased knowledge of precautions, decreased knowledge of use of DME, vision, and UE functional use, cognitive skills including attention, energy/drive, problem solving, safety awareness, sequencing, temperament/personality, thought, and understand, and psychosocial skills including coping strategies, environmental adaptation, habits, interpersonal interactions, and routines and behaviors.   IMPAIRMENTS: are limiting patient from ADLs, IADLs, work, play, leisure, and social participation.   CO-MORBIDITIES: may have co-morbidities  that affects occupational performance. Patient will benefit from skilled OT to address above impairments and improve overall function.  MODIFICATION OR ASSISTANCE TO COMPLETE EVALUATION: No modification of tasks or assist necessary to complete an evaluation.  OT OCCUPATIONAL PROFILE AND HISTORY: Detailed assessment: Review of records and additional review of physical, cognitive, psychosocial history related to current functional performance.  CLINICAL DECISION MAKING: LOW - limited treatment options, no task modification necessary  REHAB POTENTIAL: Good  EVALUATION COMPLEXITY: Low    PLAN:  OT FREQUENCY:  12 visits   OT DURATION: 12 weeks ( to account for scheduling)  PLANNED INTERVENTIONS: 97168 OT Re-evaluation, 97535 self care/ADL training, 16109 therapeutic  exercise, 97530 therapeutic activity, 97112 neuromuscular re-education, 97140 manual therapy, 97116 gait training, 60454 aquatic therapy, 97035 ultrasound, 97018 paraffin, 09811 fluidotherapy, 97010 moist heat, 97010 cryotherapy, 97129 Cognitive training (first 15 min), 91478 Cognitive training(each additional 15 min), passive range of motion, balance training, functional mobility training, visual/perceptual remediation/compensation, energy conservation, coping strategies training, patient/family education, and DME and/or AE instructions  RECOMMENDED OTHER SERVICES: n/a  CONSULTED AND AGREED WITH PLAN OF CARE: Patient and family member/caregiver  PLAN FOR NEXT SESSION: inital HEP,  cane ex   Tiffani Kadow, OT 09/30/2023, 12:43 PM

## 2023-10-02 ENCOUNTER — Ambulatory Visit: Admitting: Occupational Therapy

## 2023-10-02 ENCOUNTER — Ambulatory Visit: Admitting: Speech Pathology

## 2023-10-02 ENCOUNTER — Encounter: Payer: Self-pay | Admitting: Occupational Therapy

## 2023-10-02 ENCOUNTER — Encounter: Payer: Self-pay | Admitting: Speech Pathology

## 2023-10-02 DIAGNOSIS — R41844 Frontal lobe and executive function deficit: Secondary | ICD-10-CM

## 2023-10-02 DIAGNOSIS — R41840 Attention and concentration deficit: Secondary | ICD-10-CM

## 2023-10-02 DIAGNOSIS — M6281 Muscle weakness (generalized): Secondary | ICD-10-CM | POA: Diagnosis not present

## 2023-10-02 DIAGNOSIS — R41841 Cognitive communication deficit: Secondary | ICD-10-CM

## 2023-10-02 DIAGNOSIS — R2681 Unsteadiness on feet: Secondary | ICD-10-CM

## 2023-10-02 DIAGNOSIS — R278 Other lack of coordination: Secondary | ICD-10-CM

## 2023-10-02 DIAGNOSIS — R2689 Other abnormalities of gait and mobility: Secondary | ICD-10-CM

## 2023-10-02 NOTE — Therapy (Signed)
 OUTPATIENT OCCUPATIONAL THERAPY NEURO Treatment  Patient Name: Vu Yzaguirre MRN: [id number] DOB:26-May-2001, 23 y.o., male Today's Date: 10/04/2023  PCP: Velton Gibbon PA-C REFERRING PROVIDER: Dr. 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    Past Surgical History:  Procedure Laterality Date   CIRCUMCISION     GASTROSTOMY W/ FEEDING TUBE     TRACHEOSTOMY     Patient Active Problem List   Diagnosis Date Noted   Acute hypoxic respiratory failure (HCC) 11/25/2022   Status epilepticus (HCC) 11/25/2022   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   Asthma, chronic 11/18/2022   Hypertensive urgency 11/18/2022   Headache 09/16/2013   Migraine without aura 09/16/2013   ADD (attention deficit disorder) 09/16/2013   Transient alteration of awareness 09/16/2013    ONSET DATE: 09/24/23  REFERRING DIAG:  Diagnosis  Z74.09 (ICD-10-CM) - Other reduced mobility  Z78.9 (ICD-10-CM) - Other specified health status  G04.81 (ICD-10-CM) - Other encephalitis and encephalomyelitis    THERAPY DIAG:  Muscle weakness (generalized)  Unsteadiness on feet  Frontal lobe and executive function deficit  Attention and concentration deficit  Other lack of coordination  Other abnormalities of gait and mobility  Rationale for Evaluation and Treatment: Rehabilitation  SUBJECTIVE:   SUBJECTIVE STATEMENT: Pt denies pain Pt accompanied by: family member  PERTINENT  HISTORY: SUFIAN JACHIM is a 24 y.o. left-hand-dominant male with PMHx significant for asthma, ADD, chronic hepatitis B, MDR Pseudomonas infections, autoimmune encephalitis, and seizure who was admitted to [facility] [place] on 08/01/2023 with increased seizure frequency. He was transferred to [facility] on 08/03/23 for management of focal status epilepticus. He required intubation with escalation of antiepileptic agents. He was started on rituximab  for autoimmune encephalitis and then given cyclophosphamide. He was taken to the OR on 08/22/2023 by Dr. Dave Erie for percutaneous tracheostomy and PEG. The patient's hospital course was complicated by MSSA ventilator associated pneumonia, acute left femoral DVT, macular rash on chest, transaminitis, pancytopenia, central diabetes insipidus, stage II sacral pressure ulcer, and hypotension.  PRECAUTIONS: Fall, no driving due to seizures, PEG, capped trach  WEIGHT BEARING RESTRICTIONS: No  PAIN:  Are you having pain? No  FALLS: Has patient fallen in last 6 months? No  LIVING ENVIRONMENT: Lives with: lives with their family   PLOF: Independent  PATIENT GOALS: improve UE strength  OBJECTIVE:  Note: Objective measures were completed at Evaluation unless otherwise noted.  HAND DOMINANCE: Left  ADLs: Overall ADLs: mod I with all basic ADLs Transfers/ambulation related to ADLs: Eating: mod I  Tub Shower transfers: mod I steps into tub   Pt was working in a warehouse prior to hospitalization   MOBILITY STATUS: Independent  POSTURE COMMENTS:  rounded shoulders and forward head   ACTIVITY TOLERANCE:  Activity tolerance: decreased activity tolerance overall   UPPER EXTREMITY ROM:  LUE WFLs  Active ROM Right eval Left eval  Shoulder flexion 105   Shoulder abduction 70   Shoulder adduction    Shoulder extension    Shoulder internal rotation    Shoulder external rotation    Elbow flexion WFL   Elbow extension -15   Wrist flexion     Wrist extension    Wrist ulnar deviation    Wrist radial deviation    Wrist pronation WFL   Wrist supination WFL   (Blank rows = not tested)  UPPER EXTREMITY MMT:     MMT Right eval Left eval  Shoulder flexion 3+/5 4-/5  Shoulder abduction    Shoulder adduction    Shoulder extension    Shoulder internal rotation    Shoulder external rotation    Middle trapezius    Lower trapezius    Elbow flexion    Elbow extension    Wrist flexion    Wrist extension    Wrist ulnar deviation    Wrist radial deviation    Wrist pronation    Wrist supination    (Blank rows = not tested)  HAND FUNCTION: Grip strength: Right: 72 lbs; Left: 90 lbs  COORDINATION: 9 Hole Peg test: Right: 21.16 sec; Left: 26.08, 25.53 with drops sec  SENSATION: WFL      COGNITION: Overall cognitive status: Impaired, pt's brother reports delayed processing speed. Pt with decreased awareness of higher level cognitve deficits.  Pt recalled 3/3/ words following a dealy. He completed Trail making B correctly, however pt previously perfromed alternating attention test with ST in prior session and did not complete. OT will further assess alternating attention in a functional context. Pt was unable to spell WORLD backwards correctly, he transposed o and r.  VISION ASSESSMENT: Not tested- pt denies visual changes hx of diplopia with onset of encephalitis, pt reports this has resolved Bells Test 34/35 items located   OBSERVATIONS: Pt with falt affect accompanied by his brother                                                                                                                             TREATMENT DATE:10/02/23- see pt instructions/ pt education for inital HEP, pt required min v.c and demonstration UBE x 6 mins level 2 for conditioning  09/30/23-eval only         PATIENT EDUCATION: Education details: inital HEP  10-20 reps each, min v.c and demonstration Person educated: Patient and    Education method: Explanation, demonstration, v.c Education comprehension: verbalized understanding, returned demonstration, needs reinforcement  HOME EXERCISE PROGRAM:  10/02/23- inital HEP, cane T-band    GOALS: Goals reviewed with patient? Yes  SHORT TERM GOALS: Target date: 10/31/23  I with inital HEP  Goal status: ongoing  2.  Pt will demonstrate ability to retreive a lightweight object at 110* shoulder flexion with RUE Baseline:  Goal status: INITIAL  3.  Pt will increase RUE grip strength by by 10 lbs for increased RUE functional use.  Goal status: INITIAL  4.  Pt will perform an alternating attention task with 90% or better accuracy in rep for work activities.  Goal status: INITIAL  5.  Pt will improve LUE 9 hole peg test score by 3 secs without drops for increased ease with ADLs.  Goal status: INITIAL    LONG TERM GOALS: Target date: 12/23/23  I with updated HEP  Goal status: INITIAL  2.  Pt will demonstrate ability to retreive a  5 lbs object at 115* shoulder flexion with RUE demonstrating good control.  Goal status: INITIAL  3.  Pt will demonstrate ability to perfom a physical and cognitve task simultaneously with 90% or better accuracy in prep for eventual driving and return to work.  Goal status: INITIAL  4.  Pt will perfrom simulated work activities mod I demonstrating good safety and positioning.  Goal status: INITIAL  5.  Pt will perform prior level of home management and cooking mod I  Goal status: INITIAL    ASSESSMENT:  CLINICAL IMPRESSION:Pt is progressing towards goals. He demonstrates understanding of inital HEP. He may benefit from review. PERFORMANCE DEFICITS: in functional skills including ADLs, IADLs, coordination, dexterity, ROM, strength, flexibility, Fine motor control, Gross motor control, mobility, balance, endurance, decreased knowledge of precautions, decreased knowledge of use of DME, vision, and UE functional use, cognitive  skills including attention, energy/drive, problem solving, safety awareness, sequencing, temperament/personality, thought, and understand, and psychosocial skills including coping strategies, environmental adaptation, habits, interpersonal interactions, and routines and behaviors.   IMPAIRMENTS: are limiting patient from ADLs, IADLs, work, play, leisure, and social participation.   CO-MORBIDITIES: may have co-morbidities  that affects occupational performance. Patient will benefit from skilled OT to address above impairments and improve overall function.  MODIFICATION OR ASSISTANCE TO COMPLETE EVALUATION: No modification of tasks or assist necessary to complete an evaluation.  OT OCCUPATIONAL PROFILE AND HISTORY: Detailed assessment: Review of records and additional review of physical, cognitive, psychosocial history related to current functional performance.  CLINICAL DECISION MAKING: LOW - limited treatment options, no task modification necessary  REHAB POTENTIAL: Good  EVALUATION COMPLEXITY: Low    PLAN:  OT FREQUENCY: 12 visits   OT DURATION: 12 weeks ( to account for scheduling)  PLANNED INTERVENTIONS: 97168 OT Re-evaluation, 97535 self care/ADL training, 16109 therapeutic exercise, 97530 therapeutic activity, 97112 neuromuscular re-education, 97140 manual therapy, 97116 gait training, 60454 aquatic therapy, 97035 ultrasound, 97018 paraffin, 09811 fluidotherapy, 97010 moist heat, 97010 cryotherapy, 97129 Cognitive training (first 15 min), 91478 Cognitive training(each additional 15 min), passive range of motion, balance training, functional mobility training, visual/perceptual remediation/compensation, energy conservation, coping strategies training, patient/family education, and DME and/or AE instructions  RECOMMENDED OTHER SERVICES: n/a  CONSULTED AND AGREED WITH PLAN OF CARE: Patient and family member/caregiver  PLAN FOR NEXT SESSION: review and progress HEP   Abdulahad Mederos,  OT 10/04/2023, 10:03 AM

## 2023-10-02 NOTE — Patient Instructions (Signed)
 Shoulder: Flexion (Supine)    With hands shoulder width apart, slowly lower dowel to floor behind head. Do not let elbows bend. Keep back flat. Hold _5_ seconds. Repeat 10____ times. Do __1__ sessions per day.   Copyright  VHI. All rights reserved.   SHOULDER: Flexion - Sitting    Hold cane with both hands. Raise arms up. Keep elbows straight. Hold _5__ seconds.  _10__ reps per set, __1_ sets per day, _7__ days per week  Copyright  VHI. All rights reserved.    SCAPULA: Retraction    Hold cane with both hands. Pinch shoulder blades together. Do not shrug shoulders. Hold __5_ seconds.  __10_ reps per set, __1_ sets per day, __7_ days per week  Copyright  VHI. All rights reserved.    (Home) Retraction: Row - Bilateral (Anchor)    StandFacing anchor, arms reaching forward, pull hands toward stomach, pinching shoulder blades together. Repeat __10-20__ times per set. Do __1__ sets per session. Do __5__ sessions per week.  Copyright  VHI. All rights reserved.    Strengthening: Resisted Flexion    Strengthening: Resisted Extension   Hold tubing in __both ___ hand(s), arm forward. Pull arm back, elbow straight. Repeat _10___ times per set. Do _1-2___ sessions per day, every other day.    Copyright  VHI. All rights reserved.

## 2023-10-02 NOTE — Therapy (Signed)
 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    Past Surgical History:  Procedure Laterality Date   CIRCUMCISION     GASTROSTOMY W/ FEEDING TUBE     TRACHEOSTOMY     Patient Active Problem List   Diagnosis Date Noted   Acute hypoxic respiratory failure (HCC) 11/25/2022   Status epilepticus (HCC) 11/25/2022   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   Asthma, chronic 11/18/2022   Hypertensive urgency 11/18/2022   Headache 09/16/2013   Migraine without aura 09/16/2013   ADD (attention deficit disorder) 09/16/2013   Transient alteration of awareness 09/16/2013    ONSET DATE: Referred on 09/23/23  REFERRING DIAG: R41.89 (ICD-10-CM) - Other symptoms and signs involving cognitive functions and awareness   THERAPY DIAG:  Cognitive communication deficit  Rationale for Evaluation and Treatment: Rehabilitation  SUBJECTIVE:   SUBJECTIVE STATEMENT: Pt reports no new information  Pt accompanied by (@eval ): self and family member; Nutritional therapist (brother)  PERTINENT HISTORY: Per chart review: Colton Zhang is a 23 y.o. 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   PAIN:  Are you having pain? No  FALLS: Has patient fallen in last 6 months?  No  LIVING ENVIRONMENT: Lives with:  lives with their family (son) Lives in: House/apartment  PLOF:  Level of assistance: Independent with ADLs, Independent with IADLs Employment: Environmental education officer employment; Warehouse   Completed High School   PATIENT GOALS:   OBJECTIVE:  Note: Objective measures were completed at Evaluation unless otherwise noted.  DIAGNOSTIC FINDINGS: Per chart review (most recent):  CT Head Wo Contrast 07/31/23 IMPRESSION: No acute intracranial findings     Electronically Signed   By: Deboraha Fallow M.D.   On: 07/31/2023 16:16  COGNITION: Overall cognitive status: Impaired Areas of impairment:  Attention: Impaired: Alternating, Divided Memory: Impaired: Short term Prospective Awareness: Impaired: Emergent Executive function: Impaired: Organization, Planning, Error awareness, and Self-correction Functional deficits: Pt reports he feels "back to baseline". Brother reports seeing some impairments at home re: forgetting information  COGNITIVE COMMUNICATION: Following directions: Follows multi-step commands inconsistently  Auditory comprehension: Impaired: suspect poor attention is contributing Verbal expression: WFL Functional communication: Impaired: Brother reports pt forgetting details of instructions/conversations  ORAL MOTOR EXAMINATION: Overall status: WFL Comments:   Pt with capped TRACH and PEG tube. Voice WNL at this time. Reports no difficulty with swallowing at this time and on a regular diet. To reassess if changes post trach removal.  STANDARDIZED ASSESSMENTS:  Cognitive Linguistic Quick Test: AGE - 18 - 69   The Cognitive Linguistic Quick Test (CLQT) was administered to assess the relative status of five cognitive domains: attention, memory, language, executive functioning, and visuospatial skills. Scores from 10 tasks  were used to estimate severity ratings (standardized for age groups 18-69 years and 70-89 years) for each domain, a clock drawing task, as well as an overall  composite severity rating of cognition.       Task Score Criterion Cut Scores  Personal Facts 8/8 8  Symbol Cancellation 12/12 11  Confrontation Naming 10/10 10  Clock Drawing  10/13 12  Story Retelling 6/10 6  Symbol Trails 4/10 9  Generative Naming 4/9 5  Design Memory 6/6 5  Mazes  6/8 7  Design Generation 2/13 6    Cognitive Domain Composite Score Severity Rating  Attention 170/215 Mild  Memory 156/185 WNL  Executive Function 16/40 Moderate  Language 28/37 Mild  Visuospatial Skills 76/105 Mild  Clock Drawing  10/13 Mild  Composite Severity Rating  Mild       PATIENT REPORTED OUTCOME MEASURES (PROM): Cognitive Function: 112                                                                                                                            TREATMENT DATE:   10/02/23: Pt was seen for skilled ST services targeting continued assessment. Pt completed CLQT and Cognitive PROMs. SLP reviewed results with pt. No questions at this time. To begin with attention strategies next session.    PATIENT EDUCATION: Education details: Cognitive-communication and SLP role Person educated: Patient and brother Education method: Explanation Education comprehension: verbalized understanding and needs further education   GOALS: Goals reviewed with patient? Yes  SHORT TERM GOALS: Target date: 11/01/23  Complete PROMs Baseline: Goal status: INITIAL  2.  Complete CLQT and update goals Baseline:  Goal status: INITIAL  3.   Baseline:  Goal status: INITIAL  4.   Baseline:  Goal status: INITIAL  5.   Baseline:  Goal status: INITIAL  6.   Baseline:  Goal status: INITIAL  LONG TERM GOALS: Target date: 12/01/23  Improve score on PROMs Baseline:  Goal status: INITIAL  2.   Baseline:  Goal status: INITIAL  3.   Baseline:  Goal status: INITIAL  4.   Baseline:  Goal status: INITIAL  5.   Baseline:  Goal status: INITIAL  6.   Baseline:  Goal status:  INITIAL  ASSESSMENT:  CLINICAL IMPRESSION: Pt is a 23 yo male who presents to ST OP for evaluation with complex medical history, specifically autoimmune encephalitis and seizures. Pt with tracheostomy and PEG. Pt trach is capped and PEG is not being used. Voice WNL at this time. Reports no difficulty with swallowing at this time and tolerating regular diet. To reassess if changes post trach removal. Pt endorses feeling "like his normal self"; Brother reports he feels like he has trouble remembering details and trouble with planning and organization. Reports increased lack of motivation to complete tasks. No light or sound sensitivity. He reports he has had increase in mood swings - mostly surrounding his trach. He reports feeling re: irritable, anxious, and depressed. Affect was  noted to be "flat" this session; though both brothers report no observable personality change. Pt was assessed using CLQT - to complete next session. SLP observed impaired awareness of deficits, impaired error awareness, difficulty following directions and slowed processing. SLP rec skilled ST services to address cognitive-communication impairment to maximize functional independence.    OBJECTIVE IMPAIRMENTS: include attention, memory, awareness, and executive functioning. These impairments are limiting patient from return to work, managing medications, managing appointments, managing finances, household responsibilities, and effectively communicating at home and in community. Factors affecting potential to achieve goals and functional outcome are awareness of impairments.. Patient will benefit from skilled SLP services to address above impairments and improve overall function.  REHAB POTENTIAL: Good  PLAN:  SLP FREQUENCY: 1-2x/week  SLP DURATION: 8 weeks  PLANNED INTERVENTIONS: Environmental controls, Cueing hierachy, Internal/external aids, Functional tasks, SLP instruction and feedback, Compensatory strategies,  Patient/family education, and 40981 Treatment of speech (30 or 45 min)     [employer], CCC-SLP 10/02/2023, 10:24 AM

## 2023-10-03 ENCOUNTER — Encounter: Payer: Self-pay | Admitting: Speech Pathology

## 2023-10-08 ENCOUNTER — Encounter: Payer: Self-pay | Admitting: Speech Pathology

## 2023-10-08 ENCOUNTER — Ambulatory Visit: Admitting: Speech Pathology

## 2023-10-08 ENCOUNTER — Ambulatory Visit: Admitting: Occupational Therapy

## 2023-10-08 DIAGNOSIS — R41840 Attention and concentration deficit: Secondary | ICD-10-CM

## 2023-10-08 DIAGNOSIS — R2689 Other abnormalities of gait and mobility: Secondary | ICD-10-CM

## 2023-10-08 DIAGNOSIS — R41844 Frontal lobe and executive function deficit: Secondary | ICD-10-CM

## 2023-10-08 DIAGNOSIS — M6281 Muscle weakness (generalized): Secondary | ICD-10-CM

## 2023-10-08 DIAGNOSIS — R278 Other lack of coordination: Secondary | ICD-10-CM

## 2023-10-08 DIAGNOSIS — R2681 Unsteadiness on feet: Secondary | ICD-10-CM

## 2023-10-08 DIAGNOSIS — R41841 Cognitive communication deficit: Secondary | ICD-10-CM

## 2023-10-08 NOTE — Therapy (Signed)
 OUTPATIENT OCCUPATIONAL THERAPY NEURO Treatment  Patient Name: Colton Zhang MRN: [id number] DOB:Feb 24, 2001, 23 y.o., male Today's Date: 10/08/2023  PCP: Velton Gibbon PA-C REFERRING PROVIDER: Dr. 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    Past Surgical History:  Procedure Laterality Date   CIRCUMCISION     GASTROSTOMY W/ FEEDING TUBE     TRACHEOSTOMY     Patient Active Problem List   Diagnosis Date Noted   Acute hypoxic respiratory failure (HCC) 11/25/2022   Status epilepticus (HCC) 11/25/2022   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  Rationale for Evaluation and Treatment: Rehabilitation  SUBJECTIVE:   SUBJECTIVE STATEMENT: Pt reports peg and trach were removed Pt accompanied by: family member  PERTINENT HISTORY: Colton Zhang is a 23 y.o. left-hand-dominant male with PMHx significant for asthma, ADD, chronic hepatitis B, MDR Pseudomonas infections,  autoimmune encephalitis, and seizure who was admitted to [facility] [place] on 08/01/2023 with increased seizure frequency. He was transferred to [facility] on 08/03/23 for management of focal status epilepticus. He required intubation with escalation of antiepileptic agents. He was started on rituximab  for autoimmune encephalitis and then given cyclophosphamide. He was taken to the OR on 08/22/2023 by Dr. Dave Erie for percutaneous tracheostomy and PEG. The patient's hospital course was complicated by MSSA ventilator associated pneumonia, acute left femoral DVT, macular rash on chest, transaminitis, pancytopenia, central diabetes insipidus, stage II sacral pressure ulcer, and hypotension.  PRECAUTIONS: Fall, no driving due to seizures, PEG, capped trach  WEIGHT BEARING RESTRICTIONS: No  PAIN:  Are you having pain? Yes: NPRS scale: 3/10 Pain location: peg site Pain description: aching Aggravating factors: peg removal Relieving factors: unknown     FALLS: Has patient fallen in last 6 months? No  LIVING ENVIRONMENT: Lives with: lives with their family   PLOF: Independent  PATIENT GOALS: improve UE strength  OBJECTIVE:  Note: Objective measures were completed at Evaluation unless otherwise noted.  HAND DOMINANCE: Left  ADLs: Overall ADLs: mod I with all basic ADLs Transfers/ambulation related to ADLs: Eating: mod I  Tub Shower transfers: mod I steps into tub   Pt was working in a warehouse prior to hospitalization   MOBILITY STATUS: Independent  POSTURE COMMENTS:  rounded shoulders and forward head  ACTIVITY TOLERANCE: Activity tolerance: decreased activity tolerance overall   UPPER EXTREMITY ROM:  LUE WFLs  Active ROM Right eval Left eval  Shoulder flexion 105   Shoulder abduction 70   Shoulder adduction    Shoulder extension    Shoulder internal rotation    Shoulder external rotation    Elbow flexion WFL   Elbow extension -15   Wrist flexion    Wrist  extension    Wrist ulnar deviation    Wrist radial deviation    Wrist pronation WFL   Wrist supination WFL   (Blank rows = not tested)  UPPER EXTREMITY MMT:     MMT Right eval Left eval  Shoulder flexion 3+/5 4-/5  Shoulder abduction    Shoulder adduction    Shoulder extension    Shoulder internal rotation    Shoulder external rotation    Middle trapezius    Lower trapezius    Elbow flexion    Elbow extension    Wrist flexion    Wrist extension    Wrist ulnar deviation    Wrist radial deviation    Wrist pronation    Wrist supination    (Blank rows = not tested)  HAND FUNCTION: Grip strength: Right: 72 lbs; Left: 90 lbs  COORDINATION: 9 Hole Peg test: Right: 21.16 sec; Left: 26.08, 25.53 with drops sec  SENSATION: WFL      COGNITION: Overall cognitive status: Impaired, pt's brother reports delayed processing speed. Pt with decreased awareness of higher level cognitve deficits.  Pt recalled 3/3/ words following a dealy. He completed Trail making B correctly, however pt previously perfromed alternating attention test with ST in prior session and did not complete. OT will further assess alternating attention in a functional context. Pt was unable to spell WORLD backwards correctly, he transposed o and r.  VISION ASSESSMENT: Not tested- pt denies visual changes hx of diplopia with onset of encephalitis, pt reports this has resolved Bells Test 34/35 items located   OBSERVATIONS: Pt with flat affect accompanied by his brother                                                                                                                             TREATMENT DATE:10/07/23- Reviewed HEP issued last visit, 10 reps each exercise min v.c for positioning Checked 9 hole peg test left per pt request. Gripper set at level 4 resistance to address sustained grip for picking up 1 inch blocks, min-mod difficulty, drops. Alternating attention task contstant therapy alternating  symbols level 4, 94% accuracy           PATIENT EDUCATION: Education details: reviewed inital HEP  10-20 reps each, min v.c and demonstration Person educated: Patient and   Education method: Explanation, demonstration, v.c Education comprehension: verbalized understanding, returned demonstration, needs reinforcement  HOME EXERCISE PROGRAM:  10/02/23- inital HEP, cane T-band    GOALS: Goals reviewed with patient? Yes  SHORT TERM GOALS: Target date: 10/31/23  I with inital HEP  Goal status: met, 10/08/23  2.  Pt will demonstrate ability to retreive a lightweight object at 110* shoulder flexion with RUE Baseline:  Goal status: ongoing  3.  Pt will increase RUE grip strength by by 10 lbs for increased RUE functional use.  Goal status: INITIAL  4.  Pt will perform an alternating attention task with 90% or better accuracy in rep for work activities.  Goal status: ongoing  5.  Pt will improve LUE 9 hole peg test score by 3 secs without drops for increased ease with ADLs.  Goal status: ongoing, 24.36, 1 drop, 19.98 10/08/23    LONG TERM GOALS: Target date: 12/23/23  I with updated HEP  Goal status: INITIAL  2.  Pt will demonstrate ability to retreive a  5 lbs object at 115* shoulder flexion with RUE demonstrating good control.  Goal status: INITIAL  3.  Pt will demonstrate ability to perfom a physical and cognitve task simultaneously with 90% or better accuracy in prep for eventual driving and return to work.  Goal status: INITIAL  4.  Pt will perfrom simulated work activities mod I demonstrating good safety and positioning.  Goal status: INITIAL  5.  Pt will perform prior level of home management and cooking mod I  Goal status: INITIAL    ASSESSMENT:  CLINICAL IMPRESSION:Pt is progressing towards goals. He is progressing towards goals for RUE control, strength and alternating attention. PERFORMANCE DEFICITS: in functional skills including ADLs, IADLs,  coordination, dexterity, ROM, strength, flexibility, Fine motor control, Gross motor control, mobility, balance, endurance, decreased knowledge of precautions, decreased knowledge of use of DME, vision, and UE functional use, cognitive skills including attention, energy/drive, problem solving, safety awareness, sequencing, temperament/personality, thought, and understand, and psychosocial skills including coping strategies, environmental adaptation, habits, interpersonal interactions, and routines and behaviors.   IMPAIRMENTS: are limiting patient from ADLs, IADLs, work, play, leisure, and social participation.   CO-MORBIDITIES: may have co-morbidities  that affects occupational performance. Patient will benefit from skilled OT to address above impairments and improve overall function.  MODIFICATION OR ASSISTANCE TO COMPLETE EVALUATION: No modification of tasks or assist necessary to complete an evaluation.  OT OCCUPATIONAL PROFILE AND HISTORY: Detailed assessment: Review of records and additional review of physical, cognitive, psychosocial history related to current functional performance.  CLINICAL DECISION MAKING: LOW - limited treatment options, no task modification necessary  REHAB POTENTIAL: Good  EVALUATION COMPLEXITY: Low    PLAN:  OT FREQUENCY: 12 visits   OT DURATION: 12 weeks ( to account for scheduling)  PLANNED INTERVENTIONS: 97168 OT Re-evaluation, 97535 self care/ADL training, 81191 therapeutic exercise, 97530 therapeutic activity, 97112 neuromuscular re-education, 97140 manual therapy, 97116 gait training, 47829 aquatic therapy, 97035 ultrasound, 97018 paraffin, 56213 fluidotherapy, 97010 moist heat, 97010 cryotherapy, 97129 Cognitive training (first 15 min), 08657 Cognitive training(each additional 15 min), passive range of motion, balance training, functional mobility training, visual/perceptual remediation/compensation, energy conservation, coping strategies training,  patient/family education, and DME and/or AE instructions  RECOMMENDED OTHER SERVICES: n/a  CONSULTED AND AGREED WITH PLAN OF CARE: Patient and family member/caregiver  PLAN FOR NEXT SESSION:  consider putty   Ranessa Kosta, OT 10/08/2023, 1:07 PM

## 2023-10-08 NOTE — Therapy (Unsigned)
 OUTPATIENT SPEECH LANGUAGE PATHOLOGY TREATMENT   Patient Name: Colton Zhang MRN: [id number] DOB:[birth year], 23 y.o., male Today's Date: 10/08/2023  PCP: None Listed REFERRING PROVIDER: Royce Coria, MD  END OF SESSION:  End of Session - 10/08/23 1244     Visit Number 3    Number of Visits 17    Date for SLP Re-Evaluation 12/02/23    SLP Start Time 1241    SLP Stop Time  1315    SLP Time Calculation (min) 34 min    Activity Tolerance Patient tolerated treatment well             Past Medical History:  Diagnosis Date   ADD (attention deficit disorder)    Asthma    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    Past Surgical History:  Procedure Laterality Date   CIRCUMCISION     GASTROSTOMY W/ FEEDING TUBE     TRACHEOSTOMY     Patient Active Problem List   Diagnosis Date Noted   Acute hypoxic respiratory failure (HCC) 11/25/2022   Status epilepticus (HCC) 11/25/2022   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   Asthma, chronic 11/18/2022   Hypertensive urgency 11/18/2022   Headache 09/16/2013   Migraine without aura 09/16/2013   ADD (attention deficit disorder) 09/16/2013   Transient alteration of awareness 09/16/2013    ONSET DATE: Referred on 09/23/23  REFERRING DIAG: R41.89 (ICD-10-CM) - Other symptoms and signs involving cognitive functions and awareness   THERAPY DIAG:  Cognitive communication deficit  Rationale for Evaluation and Treatment: Rehabilitation  SUBJECTIVE:   SUBJECTIVE STATEMENT: Pt reports no new information  Pt accompanied by (@eval ): self and family member; Nutritional therapist (brother)  PERTINENT HISTORY: Per chart review: [name] is a 23 y.o. 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   PAIN:  Are you having pain? Yes: NPRS scale: 3 Pain location: Peg Site Pain description: sore Aggravating factors:  - Relieving factors: *PEG removed yesterday  FALLS: Has patient fallen in last 6 months?  No  LIVING ENVIRONMENT: Lives with: lives with their family (son) Lives in: House/apartment  PLOF:  Level of assistance: Independent with ADLs, Independent with IADLs Employment: Environmental education officer employment; Warehouse   Completed High School   PATIENT GOALS:   OBJECTIVE:  Note: Objective measures were completed at Evaluation unless otherwise noted.  DIAGNOSTIC FINDINGS: Per chart review (most recent):  CT Head Wo Contrast 07/31/23 IMPRESSION: No acute intracranial findings     Electronically Signed   By: Deboraha Fallow M.D.   On: 07/31/2023 16:16  COGNITION: Overall cognitive status: Impaired Areas of impairment:  Attention: Impaired: Alternating, Divided Memory: Impaired: Short term Prospective Awareness: Impaired: Emergent Executive function: Impaired: Organization, Planning, Error awareness, and Self-correction Functional deficits: Pt reports he feels "back to baseline". Brother reports seeing some impairments at home re: forgetting information  COGNITIVE COMMUNICATION: Following directions: Follows multi-step commands inconsistently  Auditory comprehension: Impaired: suspect poor attention is contributing Verbal expression: WFL Functional communication: Impaired: Brother reports pt forgetting details of instructions/conversations  ORAL MOTOR EXAMINATION: Overall status: WFL Comments:   Pt with capped TRACH and PEG tube. Voice WNL at this time. Reports no difficulty with swallowing at this time and on a regular diet. To reassess if changes post trach removal.  STANDARDIZED ASSESSMENTS:  Cognitive Linguistic Quick Test: AGE - 18 - 69   The Cognitive Linguistic Quick Test (CLQT) was administered to assess the  relative status of five cognitive domains: attention, memory, language, executive functioning, and visuospatial skills. Scores from 10 tasks were used to estimate severity  ratings (standardized for age groups 18-69 years and 70-89 years) for each domain, a clock drawing task, as well as an overall composite severity rating of cognition.       Task Score Criterion Cut Scores  Personal Facts 8/8 8  Symbol Cancellation 12/12 11  Confrontation Naming 10/10 10  Clock Drawing  10/13 12  Story Retelling 6/10 6  Symbol Trails 4/10 9  Generative Naming 4/9 5  Design Memory 6/6 5  Mazes  6/8 7  Design Generation 2/13 6    Cognitive Domain Composite Score Severity Rating  Attention 170/215 Mild  Memory 156/185 WNL  Executive Function 16/40 Moderate  Language 28/37 Mild  Visuospatial Skills 76/105 Mild  Clock Drawing  10/13 Mild  Composite Severity Rating  Mild       PATIENT REPORTED OUTCOME MEASURES (PROM): Cognitive Function: 112                                                                                                                            TREATMENT DATE:   10/08/23: Pt was seen for skilled ST services targeting education   10/02/23: Pt was seen for skilled ST services targeting continued assessment. Pt completed CLQT and Cognitive PROMs. SLP reviewed results with pt. No questions at this time. To begin with attention strategies next session.    PATIENT EDUCATION: Education details: Cognitive-communication and SLP role Person educated: Patient and brother Education method: Explanation Education comprehension: verbalized understanding and needs further education   GOALS: Goals reviewed with patient? Yes  SHORT TERM GOALS: Target date: 11/01/23  Complete PROMs Baseline: Goal status: INITIAL  2.  Complete CLQT and update goals Baseline:  Goal status: INITIAL  3.   Baseline:  Goal status: INITIAL  4.   Baseline:  Goal status: INITIAL  5.   Baseline:  Goal status: INITIAL  6.   Baseline:  Goal status: INITIAL  LONG TERM GOALS: Target date: 12/01/23  Improve score on PROMs Baseline:  Goal status: INITIAL  2.    Baseline:  Goal status: INITIAL  3.   Baseline:  Goal status: INITIAL  4.   Baseline:  Goal status: INITIAL  5.   Baseline:  Goal status: INITIAL  6.   Baseline:  Goal status: INITIAL  ASSESSMENT:  CLINICAL IMPRESSION: Pt is a 23 yo male who presents to ST OP for evaluation with complex medical history, specifically autoimmune encephalitis and seizures. Pt with tracheostomy and PEG. Pt trach is capped and PEG is not being used. Voice WNL at this time. Reports no difficulty with swallowing at this time and tolerating regular diet. To reassess if changes post trach removal. Pt endorses feeling "like his normal self"; Brother reports he feels like he has trouble remembering details and trouble with planning and organization. Reports increased lack of motivation to complete  tasks. No light or sound sensitivity. He reports he has had increase in mood swings - mostly surrounding his trach. He reports feeling re: irritable, anxious, and depressed. Affect was noted to be "flat" this session; though both brothers report no observable personality change. Pt was assessed using CLQT - to complete next session. SLP observed impaired awareness of deficits, impaired error awareness, difficulty following directions and slowed processing. SLP rec skilled ST services to address cognitive-communication impairment to maximize functional independence.    OBJECTIVE IMPAIRMENTS: include attention, memory, awareness, and executive functioning. These impairments are limiting patient from return to work, managing medications, managing appointments, managing finances, household responsibilities, and effectively communicating at home and in community. Factors affecting potential to achieve goals and functional outcome are awareness of impairments.. Patient will benefit from skilled SLP services to address above impairments and improve overall function.  REHAB POTENTIAL: Good  PLAN:  SLP FREQUENCY:  1-2x/week  SLP DURATION: 8 weeks  PLANNED INTERVENTIONS: Environmental controls, Cueing hierachy, Internal/external aids, Functional tasks, SLP instruction and feedback, Compensatory strategies, Patient/family education, and 16109 Treatment of speech (30 or 45 min)     [employer], CCC-SLP 10/08/2023, 12:46 PM

## 2023-10-14 ENCOUNTER — Encounter: Payer: Self-pay | Admitting: Occupational Therapy

## 2023-10-14 ENCOUNTER — Other Ambulatory Visit: Payer: Self-pay

## 2023-10-14 ENCOUNTER — Ambulatory Visit: Admitting: Physical Therapy

## 2023-10-14 ENCOUNTER — Ambulatory Visit: Admitting: Occupational Therapy

## 2023-10-14 ENCOUNTER — Encounter: Payer: Self-pay | Admitting: Physical Therapy

## 2023-10-14 DIAGNOSIS — R2681 Unsteadiness on feet: Secondary | ICD-10-CM

## 2023-10-14 DIAGNOSIS — R41840 Attention and concentration deficit: Secondary | ICD-10-CM

## 2023-10-14 DIAGNOSIS — M6281 Muscle weakness (generalized): Secondary | ICD-10-CM | POA: Diagnosis not present

## 2023-10-14 DIAGNOSIS — R41844 Frontal lobe and executive function deficit: Secondary | ICD-10-CM

## 2023-10-14 DIAGNOSIS — R278 Other lack of coordination: Secondary | ICD-10-CM

## 2023-10-14 NOTE — Therapy (Signed)
 OUTPATIENT OCCUPATIONAL THERAPY NEURO Treatment  Patient Name: Colton Zhang MRN: [id number] DOB:April 18, 2001, 23 y.o., male Today's Date: 10/14/2023  PCP: Velton Gibbon PA-C REFERRING PROVIDER: Dr. Dennison Fix  END OF SESSION:  OT End of Session - 10/14/23 1537     Visit Number 4    Number of Visits 12    Date for OT Re-Evaluation 12/23/23    Authorization Type [employer]    Authorization Time Period 23 visits split with PT    Authorization - Visit Number 4    Authorization - Number of Visits 12    OT Start Time 1448    OT Stop Time 1527    OT Time Calculation (min) 39 min    Activity Tolerance Patient tolerated treatment well    Behavior During Therapy Flat affect               Past Medical History:  Diagnosis Date   ADD (attention deficit disorder)    Asthma    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    Past Surgical History:  Procedure Laterality Date   CIRCUMCISION     GASTROSTOMY W/ FEEDING TUBE     TRACHEOSTOMY     Patient Active Problem List   Diagnosis Date Noted   Acute hypoxic respiratory failure (HCC) 11/25/2022   Status epilepticus (HCC) 11/25/2022   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   Asthma, chronic 11/18/2022   Hypertensive urgency 11/18/2022   Headache 09/16/2013   Migraine without aura 09/16/2013   ADD (attention deficit disorder) 09/16/2013   Transient alteration of awareness 09/16/2013    ONSET DATE: 09/24/23  REFERRING DIAG:  Diagnosis  Z74.09 (ICD-10-CM) - Other reduced mobility  Z78.9 (ICD-10-CM) - Other specified health status  G04.81 (ICD-10-CM) - Other encephalitis and encephalomyelitis    THERAPY DIAG:  Muscle weakness (generalized)  Unsteadiness on feet  Frontal lobe and executive function deficit  Other lack of coordination  Attention and concentration deficit  Rationale for Evaluation and Treatment: Rehabilitation  SUBJECTIVE:   SUBJECTIVE STATEMENT: Pt  denies pain Pt accompanied by: family member  PERTINENT HISTORY: Colton Zhang is a 23 y.o. left-hand-dominant male with PMHx significant for asthma, ADD, chronic hepatitis B, MDR Pseudomonas infections, autoimmune encephalitis, and seizure who was admitted to [facility] on 08/01/2023 with increased seizure frequency. He was transferred to [facility] on 08/03/23 for management of focal status epilepticus. He required intubation with escalation of antiepileptic agents. He was started on rituximab  for autoimmune encephalitis and then given cyclophosphamide. He was taken to the OR on 08/22/2023 by Dr. Dave Erie for percutaneous tracheostomy and PEG. The patient's hospital course was complicated by MSSA ventilator associated pneumonia, acute left femoral DVT, macular rash on chest, transaminitis, pancytopenia, central diabetes insipidus, stage II sacral pressure ulcer, and hypotension.  PRECAUTIONS: Fall, no driving due to seizures, PEG, capped trach  WEIGHT BEARING RESTRICTIONS: No  PAIN: Denies pain    FALLS: Has patient fallen in last 6 months? No  LIVING ENVIRONMENT: Lives with: lives with their family   PLOF: Independent  PATIENT GOALS: improve UE strength  OBJECTIVE:  Note: Objective measures were completed at Evaluation unless otherwise noted.  HAND DOMINANCE: Left  ADLs: Overall ADLs: mod I with all basic ADLs Transfers/ambulation related to ADLs: Eating: mod I  Tub Shower transfers: mod I steps into tub   Pt was working in a warehouse prior to hospitalization   MOBILITY STATUS: Independent  POSTURE COMMENTS:  rounded shoulders and forward head   ACTIVITY TOLERANCE: Activity  tolerance: decreased activity tolerance overall   UPPER EXTREMITY ROM:  LUE WFLs  Active ROM Right eval Left eval  Shoulder flexion 105   Shoulder abduction 70   Shoulder adduction    Shoulder extension    Shoulder internal rotation    Shoulder external rotation    Elbow flexion WFL   Elbow extension -15   Wrist flexion    Wrist extension    Wrist  ulnar deviation    Wrist radial deviation    Wrist pronation WFL   Wrist supination WFL   (Blank rows = not tested)  UPPER EXTREMITY MMT:     MMT Right eval Left eval  Shoulder flexion 3+/5 4-/5  Shoulder abduction    Shoulder adduction    Shoulder extension    Shoulder internal rotation    Shoulder external rotation    Middle trapezius    Lower trapezius    Elbow flexion    Elbow extension    Wrist flexion    Wrist extension    Wrist ulnar deviation    Wrist radial deviation    Wrist pronation    Wrist supination    (Blank rows = not tested)  HAND FUNCTION: Grip strength: Right: 72 lbs; Left: 90 lbs  COORDINATION: 9 Hole Peg test: Right: 21.16 sec; Left: 26.08, 25.53 with drops sec  SENSATION: WFL      COGNITION: Overall cognitive status: Impaired, pt's brother reports delayed processing speed. Pt with decreased awareness of higher level cognitve deficits.  Pt recalled 3/3/ words following a dealy. He completed Trail making B correctly, however pt previously perfromed alternating attention test with ST in prior session and did not complete. OT will further assess alternating attention in a functional context. Pt was unable to spell WORLD backwards correctly, he transposed o and r.  VISION ASSESSMENT: Not tested- pt denies visual changes hx of diplopia with onset of encephalitis, pt reports this has resolved Bells Test 34/35 items located   OBSERVATIONS: Pt with flat affect accompanied by his brother                                                                                                                             TREATMENT DATE: 10/14/23- alternating attention task on constant therapy:Altenating words level 1, 91% accuracy, response time was significantly increased to 153.67 secs Upgraded theraband exercises for rowing and shoulder extension to red band bilateral UE's 15 reps each min v.c Therapsit checked several goals see below. Ambulating while  tossing a ball and naming animals for each letter of the alphabet, min-mod v.c and several LOB. Increased processing time required.  10/07/23- Reviewed HEP issued last visit, 10 reps each exercise min v.c for positioning Checked 9 hole peg test left per pt request. Gripper set at level 4 resistance to address sustained grip for picking up 1 inch blocks, min-mod difficulty, drops. Alternating attention task contstant therapy alternating symbols level 4, 94% accuracy  PATIENT EDUCATION: Education details:  upgraded  to red theraband 15 reps each Person educated: Patient and   Education method: Explanation, demonstration, v.c Education comprehension: verbalized understanding, returned demonstration, needs reinforcement  HOME EXERCISE PROGRAM:  10/02/23- inital HEP, cane T-band  10/14/23- red t-band for shoulder extension and rows  GOALS: Goals reviewed with patient? Yes  SHORT TERM GOALS: Target date: 10/31/23  I with inital HEP  Goal status: met, 10/08/23  2.  Pt will demonstrate ability to retreive a lightweight object at 110* shoulder flexion with RUE Baseline:  Goal status: met, 115* 10/14/23  3.  Pt will increase RUE grip strength by by 10 lbs for increased RUE functional use.  Goal status: met, 85 lbs, 90 lbs 10/14/23  4.  Pt will perform an alternating attention task with 90% or better accuracy in rep for work activities.  Goal status: ongoing, 91% today, level 1 for alternating words, increased processing time required. 10/14/23  5.  Pt will improve LUE 9 hole peg test score by 3 secs without drops for increased ease with ADLs.  Goal status: ongoing, 24.36, 1 drop, 19.98 10/08/23    LONG TERM GOALS: Target date: 12/23/23  I with updated HEP  Goal status: INITIAL  2.  Pt will demonstrate ability to retreive a  5 lbs object at 115* shoulder flexion with RUE demonstrating good control.  Goal status: INITIAL  3.  Pt will demonstrate ability to perfom a  physical and cognitve task simultaneously with 90% or better accuracy in prep for eventual driving and return to work.  Goal status: ongoing, min-mod v.c 10/14/23  4.  Pt will perfrom simulated work activities mod I demonstrating good safety and positioning.  Goal status: INITIAL  5.  Pt will perform prior level of home management and cooking mod I  Goal status: INITIAL    ASSESSMENT:  CLINICAL IMPRESSION:Pt is progressing towards goals. He demonstrates improved functional reach and was able to retrieve an items form overhead shelf. pt met several short term goals. refer to goals. PERFORMANCE DEFICITS: in functional skills including ADLs, IADLs, coordination, dexterity, ROM, strength, flexibility, Fine motor control, Gross motor control, mobility, balance, endurance, decreased knowledge of precautions, decreased knowledge of use of DME, vision, and UE functional use, cognitive skills including attention, energy/drive, problem solving, safety awareness, sequencing, temperament/personality, thought, and understand, and psychosocial skills including coping strategies, environmental adaptation, habits, interpersonal interactions, and routines and behaviors.   IMPAIRMENTS: are limiting patient from ADLs, IADLs, work, play, leisure, and social participation.   CO-MORBIDITIES: may have co-morbidities  that affects occupational performance. Patient will benefit from skilled OT to address above impairments and improve overall function.  MODIFICATION OR ASSISTANCE TO COMPLETE EVALUATION: No modification of tasks or assist necessary to complete an evaluation.  OT OCCUPATIONAL PROFILE AND HISTORY: Detailed assessment: Review of records and additional review of physical, cognitive, psychosocial history related to current functional performance.  CLINICAL DECISION MAKING: LOW - limited treatment options, no task modification necessary  REHAB POTENTIAL: Good  EVALUATION COMPLEXITY:  Low    PLAN:  OT FREQUENCY: 12 visits   OT DURATION: 12 weeks ( to account for scheduling)  PLANNED INTERVENTIONS: 97168 OT Re-evaluation, 97535 self care/ADL training, 16109 therapeutic exercise, 97530 therapeutic activity, 97112 neuromuscular re-education, 97140 manual therapy, 97116 gait training, 60454 aquatic therapy, 97035 ultrasound, 97018 paraffin, 09811 fluidotherapy, 97010 moist heat, 97010 cryotherapy, 97129 Cognitive training (first 15 min), 91478 Cognitive training(each additional 15 min), passive range of motion, balance training, functional mobility training,  visual/perceptual remediation/compensation, energy conservation, coping strategies training, patient/family education, and DME and/or AE instructions  RECOMMENDED OTHER SERVICES: n/a  CONSULTED AND AGREED WITH PLAN OF CARE: Patient and family member/caregiver  PLAN FOR NEXT SESSION:  continue to address strength and functional reach   Keyerra Lamere, OT 10/14/2023, 3:40 PM

## 2023-10-14 NOTE — Therapy (Signed)
 OUTPATIENT PHYSICAL THERAPY NEURO TREATMENT   Patient Name: Colton Zhang MRN: [id number] DOB:Aug 02, 2000, 23 y.o., male Today's Date: 10/14/2023   PCP: No PCP REFERRING PROVIDER: Royce Coria  END OF SESSION:  PT End of Session - 10/14/23 1344     Visit Number 2    Date for PT Re-Evaluation 11/25/23    PT Start Time 1345    PT Stop Time 1430    PT Time Calculation (min) 45 min    Activity Tolerance Patient tolerated treatment well    Behavior During Therapy [facility] for tasks assessed/performed;Flat affect             Past Medical History:  Diagnosis Date   ADD (attention deficit disorder)    Asthma    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9 (ICD-10-CM) - Other specified health status  R56.9 (ICD-10-CM) - Unspecified convulsions    THERAPY DIAG:  Muscle weakness (generalized)  Unsteadiness on feet  Rationale for Evaluation and Treatment: Rehabilitation  SUBJECTIVE:                                                                                                                                                                                             SUBJECTIVE STATEMENT: Doing ok, balance is getting better  Pt accompanied by: family member brother   PERTINENT HISTORY: Colton Zhang is a 23 y.o. left-hand-dominant male with PMHx significant for asthma, ADD, chronic  hepatitis B, MDR Pseudomonas infections, autoimmune encephalitis, and seizure who was admitted to [facility] on 08/01/2023 with increased seizure frequency. He was transferred to [facility] on 08/03/23 for management of focal status epilepticus. He required intubation with escalation of antiepileptic agents. He was started on rituximab  for autoimmune encephalitis and then given cyclophosphamide. He was taken to the OR on 08/22/2023 by Dr. Dave Erie for percutaneous tracheostomy and PEG. The patient's hospital course was complicated by MSSA ventilator associated pneumonia, acute left femoral DVT, macular rash on chest, transaminitis, pancytopenia, central diabetes insipidus, stage  II sacral pressure ulcer, and hypotension. The patient made gradual progress and began to work in therapy. Independence was limited by decreased activity tolerance, impaired balance, decreased strength, and impaired coordination. He was admitted to inpatient rehabilitation for conference of therapy and continued medical management.    PAIN:  Are you having pain? No  PRECAUTIONS: None  RED FLAGS: None   WEIGHT BEARING RESTRICTIONS: No  FALLS: Has patient fallen in last 6 months? No  LIVING ENVIRONMENT: Lives with: lives with their family Lives in: House/apartment Stairs: Yes: External: 3 steps; on left going up Has following equipment at home: None  PLOF: Independent with basic ADLs  PATIENT GOALS: "not really"   OBJECTIVE:  Note: Objective measures were completed at Evaluation unless otherwise noted.  DIAGNOSTIC FINDINGS: FINDINGS: Brain: No acute intracranial hemorrhage. No focal mass lesion. No CT evidence of acute infarction. No midline shift or mass effect. No hydrocephalus. Basilar cisterns are patent.   Vascular: No hyperdense vessel or unexpected calcification.   Skull: Normal. Negative for fracture or focal lesion.   Sinuses/Orbits: Paranasal sinuses and mastoid air cells are clear. Orbits  are clear.   Other: None.   IMPRESSION: No acute intracranial findings  COGNITION: Overall cognitive status: History of cognitive impairments - at baseline   SENSATION: WFL  COORDINATION: Decreased coordination   MUSCLE LENGTH: Hamstrings: some HS tightness BLE   POSTURE: flexed trunk  and leaned forward at the hips  LOWER EXTREMITY ROM:   WFL   LOWER EXTREMITY MMT:    MMT Right Eval Left Eval  Hip flexion 4+ 4+  Hip extension    Hip abduction 4 4  Hip adduction 5 5  Hip internal rotation    Hip external rotation    Knee flexion 5 5  Knee extension 3+ 3+  Ankle dorsiflexion    Ankle plantarflexion    Ankle inversion    Ankle eversion    (Blank rows = not tested)   TRANSFERS: Sit to stand: unable to do from chair without UE push off  Assistive device utilized: None     Stand to sit: Modified independence  Assistive device utilized: None     Chair to chair: Modified independence  Assistive device utilized: None       STAIRS: Not tested GAIT: Findings: Gait Characteristics: ataxic, decreased trunk rotation, trunk flexed, narrow BOS, poor foot clearance- Right, and poor foot clearance- Left, Distance walked: in clinic distances, and Comments: some instability noted with gait, quad weakness  FUNCTIONAL TESTS:  5 times sit to stand: unable to do from chair, from elevated mat table 15s some LOB backwards Timed up and go (TUG): 13s Berg Balance Scale: 47/56                                                                                                                              TREATMENT DATE:  10/14/23 NuStep L 5 x 6 min Sit to stand from elevated mat  2x10 HS curls 25lb 2x10 Leg Ext 10lb 2x10  Leg press 40lb 2x12 30lb resisted side steps x7 each 6in step ups x10 each  EVAL 09/30/23    PATIENT EDUCATION: Education details: POC and HEP Person educated: Patient Education method: Medical illustrator Education comprehension: verbalized  understanding  HOME EXERCISE PROGRAM: Access Code: [email or web address] URL: [email or web address] Date: 09/30/2023 Prepared by: Donavon Fudge  Exercises - Sit to Stand  - 1 x daily - 7 x weekly - 2 sets - 10 reps - Single Leg Stance  - 1 x daily - 7 x weekly - 5 reps - 10 hold - Tandem Stance in Corner  - 1 x daily - 7 x weekly - 3 reps - 15 hold - Seated Knee Extension with Resistance  - 1 x daily - 7 x weekly - 2 sets - 10 reps - Seated Hip Abduction with Resistance  - 1 x daily - 7 x weekly - 2 sets - 10 reps  GOALS: Goals reviewed with patient? Yes  SHORT TERM GOALS: Target date: 10/28/23  Patient will be independent with initial HEP. Baseline: given 09/30/23 Goal status: INITIAL  2.  Patient will demonstrate decreased fall risk by scoring < 12 sec on TUG. Baseline: 13s Goal status: INITIAL  3.  Patient will demonstrate SLS >10s on firm and foam surfaces BLE Baseline: 5-10s on firm Goal status: INITIAL   LONG TERM GOALS: Target date: 11/25/23  Patient will be independent with advanced/ongoing HEP to improve outcomes and carryover.  Baseline:  Goal status: INITIAL  2.  Patient will be able to do tandem hold for 30s bilaterally.  Baseline: needs help to step and holds for 15s Goal status: INITIAL  3.  Patient will be able to complete 10 consecutive tandem steps Baseline: 3 steps before LOB Goal status: INITIAL   4.  Patient will demonstrate improved functional LE strength as demonstrated by 5xSTS from chair height or lowest level mat <14s. Baseline: 15s from elevated mat w/LOB, unable to do from chair height  Goal status: INITIAL  5.  Patient will increase quad strength to 5/5  Baseline: 3+ BLE Goal status: INITIAL   ASSESSMENT:  CLINICAL IMPRESSION: Patient is a 23 y.o. male who was seen today for physical therapy treatment for encephalitis. He was in the hospital for over a month and attending inpatient rehab to address his reduced mobility and unspecified  convulsions. Pt presents with some functional weakness. Pt had difficulty standing from standing height chair. Sit to stands completed from a elevated surface, cues to prevent excessive forward trunk lean. Cues for full ROM needed with curls and extensions. No pain reported during session. He will benefit from PT to address his strength and balance deficits to improve his stability and mobility to decrease his risk for falls.    OBJECTIVE IMPAIRMENTS: Abnormal gait, decreased balance, decreased coordination, decreased strength, and decreased safety awareness.   ACTIVITY LIMITATIONS: locomotion level    PERSONAL FACTORS: Age, Behavior pattern, Fitness, Past/current experiences, Time since onset of injury/illness/exacerbation, and 3+ comorbidities: ADD, encephalitis, trach, PEG are also affecting patient's functional outcome.   REHAB POTENTIAL: Good  CLINICAL DECISION MAKING: Stable/uncomplicated  EVALUATION COMPLEXITY: Low  PLAN:  PT FREQUENCY: 1-2x/week  PT DURATION: 8 weeks  PLANNED INTERVENTIONS: 97110-Therapeutic exercises, 97530- Therapeutic activity, [id number]- Neuromuscular re-education, 97535- Self Care, [id number]- Manual therapy, [id number]- Gait training, Patient/Family education, Balance training, Stair training, Cryotherapy, and Moist heat  PLAN FOR NEXT SESSION: strength and balance training   Gwendlyn Lemmings  Demetrion Wesby, PTA 10/14/2023, 1:44 PM

## 2023-10-17 ENCOUNTER — Encounter: Payer: Self-pay | Admitting: Speech Pathology

## 2023-10-17 ENCOUNTER — Encounter: Payer: Self-pay | Admitting: Occupational Therapy

## 2023-10-17 ENCOUNTER — Ambulatory Visit: Admitting: Speech Pathology

## 2023-10-17 ENCOUNTER — Encounter: Payer: Self-pay | Admitting: Physical Therapy

## 2023-10-17 ENCOUNTER — Ambulatory Visit: Admitting: Physical Therapy

## 2023-10-17 ENCOUNTER — Ambulatory Visit: Admitting: Occupational Therapy

## 2023-10-17 DIAGNOSIS — R278 Other lack of coordination: Secondary | ICD-10-CM

## 2023-10-17 DIAGNOSIS — R41841 Cognitive communication deficit: Secondary | ICD-10-CM

## 2023-10-17 DIAGNOSIS — R2681 Unsteadiness on feet: Secondary | ICD-10-CM

## 2023-10-17 DIAGNOSIS — M6281 Muscle weakness (generalized): Secondary | ICD-10-CM

## 2023-10-17 DIAGNOSIS — R41844 Frontal lobe and executive function deficit: Secondary | ICD-10-CM

## 2023-10-17 DIAGNOSIS — R41840 Attention and concentration deficit: Secondary | ICD-10-CM

## 2023-10-17 NOTE — Therapy (Signed)
 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    Past Surgical History:  Procedure Laterality Date   CIRCUMCISION     GASTROSTOMY W/ FEEDING TUBE     TRACHEOSTOMY     Patient Active Problem List   Diagnosis Date Noted   Acute hypoxic respiratory failure (HCC) 11/25/2022   Status epilepticus (HCC) 11/25/2022   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 Able to move his appt today.   Pt accompanied by (@eval ): self and family member; Nutritional therapist (brother)  PERTINENT HISTORY: Per chart review: Colton Zhang is a 23 y.o. 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   PAIN:  Are you having pain? No   FALLS: Has patient  fallen in last 6 months?  No  LIVING ENVIRONMENT: Lives with: lives with their family (son) Lives in: House/apartment  PLOF:  Level of assistance: Independent with ADLs, Independent with IADLs Employment: Environmental education officer employment; [employer]house   Completed High School   PATIENT GOALS:   OBJECTIVE:  Note: Objective measures were completed at Evaluation unless otherwise noted.  DIAGNOSTIC FINDINGS: Per chart review (most recent):  CT Head Wo Contrast 07/31/23 IMPRESSION: No acute intracranial findings     Electronically Signed   By: Deboraha Fallow M.D.   On: 07/31/2023 16:16  COGNITION: Overall cognitive status: Impaired Areas of impairment:  Attention: Impaired: Alternating, Divided Memory: Impaired: Short term Prospective Awareness: Impaired: Emergent Executive function: Impaired: Organization, Planning, Error awareness, and Self-correction Functional deficits: Pt reports he feels "back to baseline". Brother reports seeing some impairments at home re: forgetting information  COGNITIVE COMMUNICATION: Following directions: Follows multi-step commands inconsistently  Auditory comprehension: Impaired: suspect poor attention is contributing Verbal expression: WFL Functional communication: Impaired: Brother reports pt forgetting details of instructions/conversations  ORAL MOTOR EXAMINATION: Overall status: WFL Comments:   Pt with capped TRACH and PEG tube. Voice WNL at this time. Reports no difficulty with swallowing at this time and on a regular diet. To reassess if changes post trach removal.  STANDARDIZED ASSESSMENTS:  Cognitive Linguistic Quick Test: AGE - 18 - 69   The Cognitive Linguistic Quick Test (CLQT) was administered to assess  the relative status of five cognitive domains: attention, memory, language, executive functioning, and visuospatial skills. Scores from 10 tasks were used to estimate severity ratings (standardized for age groups 18-69 years and 70-89 years)  for each domain, a clock drawing task, as well as an overall composite severity rating of cognition.       Task Score Criterion Cut Scores  Personal Facts 8/8 8  Symbol Cancellation 12/12 11  Confrontation Naming 10/10 10  Clock Drawing  10/13 12  Story Retelling 6/10 6  Symbol Trails 4/10 9  Generative Naming 4/9 5  Design Memory 6/6 5  Mazes  6/8 7  Design Generation 2/13 6    Cognitive Domain Composite Score Severity Rating  Attention 170/215 Mild  Memory 156/185 WNL  Executive Function 16/40 Moderate  Language 28/37 Mild  Visuospatial Skills 76/105 Mild  Clock Drawing  10/13 Mild  Composite Severity Rating  Mild       PATIENT REPORTED OUTCOME MEASURES (PROM): Cognitive Function: 112                                                                                                                            TREATMENT DATE:   10/17/23: Pt was seen for skilled ST services targeting cognitive-communication. Pt forgot his folder today, but has tried to implement a better sleep routine. Pt continues to feel like he doesn't have many deficits, but when asked, he does feel like processing is slowed. SLP completed education on attention strategies. To complete higher level executive functioning tasks to increase pt awareness of impairments.   10/08/23: Pt was seen for skilled ST services targeting cognitive-communication. SLP initiated education on attention strategies; though, pt reports he doesn't have much difficulty with attention (even with ADD dx & performance on standardized test). SLP encouraged pt to remain aware and attempt to identify what we are talking about when he is at home. Currently, pt reports poor sleep schedule. SLP suggested creating a routine to remain more consistent and decrease fatigue during the day. To cont .   10/02/23: Pt was seen for skilled ST services targeting continued assessment. Pt completed CLQT and Cognitive PROMs. SLP reviewed results with pt. No  questions at this time. To begin with attention strategies next session.    PATIENT EDUCATION: Education details: Cognitive-communication and SLP role Person educated: Patient and brother Education method: Explanation Education comprehension: verbalized understanding and needs further education   GOALS: Goals reviewed with patient? Yes  SHORT TERM GOALS: Target date: 11/01/23  Complete PROMs Baseline: Goal status: INITIAL  2.  Complete CLQT and update goals Baseline:  Goal status: INITIAL  3.  Pt will verbalize 3 memory strategies to be used in recall of important information Baseline:  Goal status: INITIAL   4.  Pt will verbalize 3 strategies to increase focus on cognitive tasks Baseline:  Goal status: INITIAL  LONG TERM GOALS: Target date: 12/01/23  Improve score on PROMs Baseline:  Goal status: INITIAL  2.  Pt will report successful use of memory strategies at home Baseline:  Goal status: INITIAL   3.  Pt will report successful use of attention strategies at home Baseline:  Goal status: INITIAL  ASSESSMENT:  CLINICAL IMPRESSION: Pt is a 23 yo male who presents to ST OP for evaluation with complex medical history, specifically autoimmune encephalitis and seizures. Pt with tracheostomy and PEG. Pt trach is capped and PEG is not being used. Voice WNL at this time. Reports no difficulty with swallowing at this time and tolerating regular diet. To reassess if changes post trach removal. Pt endorses feeling "like his normal self"; Brother reports he feels like he has trouble remembering details and trouble with planning and organization. Reports increased lack of motivation to complete tasks. No light or sound sensitivity. He reports he has had increase in mood swings - mostly surrounding his trach. He reports feeling re: irritable, anxious, and depressed. Affect was noted to be "flat" this session; though both brothers report no observable personality change. Pt was assessed  using CLQT - to complete next session. SLP observed impaired awareness of deficits, impaired error awareness, difficulty following directions and slowed processing. SLP rec skilled ST services to address cognitive-communication impairment to maximize functional independence.    OBJECTIVE IMPAIRMENTS: include attention, memory, awareness, and executive functioning. These impairments are limiting patient from return to work, managing medications, managing appointments, managing finances, household responsibilities, and effectively communicating at home and in community. Factors affecting potential to achieve goals and functional outcome are awareness of impairments.. Patient will benefit from skilled SLP services to address above impairments and improve overall function.  REHAB POTENTIAL: Good  PLAN:  SLP FREQUENCY: 1-2x/week  SLP DURATION: 8 weeks  PLANNED INTERVENTIONS: Environmental controls, Cueing hierachy, Internal/external aids, Functional tasks, SLP instruction and feedback, Compensatory strategies, Patient/family education, and [id number] Treatment of speech (30 or 45 min)     [employer], CCC-SLP 10/17/2023, 11:01 AM

## 2023-10-17 NOTE — Patient Instructions (Addendum)
    Strengthening: Resisted Flexion   Attach tube to door.  Hold tubing with one arm at side. Pull forward and up with elbow straight. Move shoulder through pain-free range of motion, no further than shoulder height. Repeat 10 times per set.  Do 1-2 sessions per day.

## 2023-10-17 NOTE — Therapy (Signed)
 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9 (ICD-10-CM) - Other specified health status  R56.9 (ICD-10-CM) - Unspecified convulsions    THERAPY DIAG:  Muscle weakness (generalized)  Frontal lobe and executive function deficit  Unsteady gait  Rationale for Evaluation and Treatment: Rehabilitation  SUBJECTIVE:                                                                                                                                                                                             SUBJECTIVE STATEMENT: Pretty good  Pt accompanied by: family member brother   PERTINENT HISTORY: Colton Zhang is a 23 y.o. left-hand-dominant male with PMHx significant for asthma, ADD, chronic hepatitis B, MDR  Pseudomonas infections, autoimmune encephalitis, and seizure who was admitted to [facility] on 08/01/2023 with increased seizure frequency. He was transferred to [facility] on 08/03/23 for management of focal status epilepticus. He required intubation with escalation of antiepileptic agents. He was started on rituximab  for autoimmune encephalitis and then given cyclophosphamide. He was taken to the OR on 08/22/2023 by Dr. Dave Erie for percutaneous tracheostomy and PEG. The patient's hospital course was complicated by MSSA ventilator associated pneumonia, acute left femoral DVT, macular rash on chest, transaminitis, pancytopenia, central diabetes insipidus, stage II  sacral pressure ulcer, and hypotension. The patient made gradual progress and began to work in therapy. Independence was limited by decreased activity tolerance, impaired balance, decreased strength, and impaired coordination. He was admitted to inpatient rehabilitation for conference of therapy and continued medical management.    PAIN:  Are you having pain? No  PRECAUTIONS: None  RED FLAGS: None   WEIGHT BEARING RESTRICTIONS: No  FALLS: Has patient fallen in last 6 months? No  LIVING ENVIRONMENT: Lives with: lives with their family Lives in: House/apartment Stairs: Yes: External: 3 steps; on left going up Has following equipment at home: None  PLOF: Independent with basic ADLs  PATIENT GOALS: "not really"   OBJECTIVE:  Note: Objective measures were completed at Evaluation unless otherwise noted.  DIAGNOSTIC FINDINGS: FINDINGS: Brain: No acute intracranial hemorrhage. No focal mass lesion. No CT evidence of acute infarction. No midline shift or mass effect. No hydrocephalus. Basilar cisterns are patent.   Vascular: No hyperdense vessel or unexpected calcification.   Skull: Normal. Negative for fracture or focal lesion.   Sinuses/Orbits: Paranasal sinuses and mastoid air cells are clear. Orbits are clear.    Other: None.   IMPRESSION: No acute intracranial findings  COGNITION: Overall cognitive status: History of cognitive impairments - at baseline   SENSATION: WFL  COORDINATION: Decreased coordination   MUSCLE LENGTH: Hamstrings: some HS tightness BLE   POSTURE: flexed trunk  and leaned forward at the hips  LOWER EXTREMITY ROM:   WFL   LOWER EXTREMITY MMT:    MMT Right Eval Left Eval  Hip flexion 4+ 4+  Hip extension    Hip abduction 4 4  Hip adduction 5 5  Hip internal rotation    Hip external rotation    Knee flexion 5 5  Knee extension 3+ 3+  Ankle dorsiflexion    Ankle plantarflexion    Ankle inversion    Ankle eversion    (Blank rows = not tested)   TRANSFERS: Sit to stand: unable to do from chair without UE push off  Assistive device utilized: None     Stand to sit: Modified independence  Assistive device utilized: None     Chair to chair: Modified independence  Assistive device utilized: None       STAIRS: Not tested GAIT: Findings: Gait Characteristics: ataxic, decreased trunk rotation, trunk flexed, narrow BOS, poor foot clearance- Right, and poor foot clearance- Left, Distance walked: in clinic distances, and Comments: some instability noted with gait, quad weakness  FUNCTIONAL TESTS:  5 times sit to stand: unable to do from chair, from elevated mat table 15s some LOB backwards Timed up and go (TUG): 13s Berg Balance Scale: 47/56                                                                                                                              TREATMENT DATE:  10/17/23 NuStep L 5 x 6 min 8in step ups from airex forward &  lateral  x 10 each   Heel raises black bar  HS curls 35lb 2x15 Leg Ext 10lb 2x10   10/14/23 NuStep L 5 x 6 min Sit to stand from elevated mat 2x10 HS curls 25lb 2x10 Leg Ext 10lb 2x10  Leg press 40lb 2x12 30lb resisted side steps x7 each 6in step ups x10 each  EVAL 09/30/23    PATIENT EDUCATION: Education  details: POC and HEP Person educated: Patient Education method: Medical illustrator Education comprehension: verbalized understanding  HOME EXERCISE PROGRAM: Access Code: [email or web address] URL: [email or web address] Date: 09/30/2023 Prepared by: Donavon Fudge  Exercises - Sit to Stand  - 1 x daily - 7 x weekly - 2 sets - 10 reps - Single Leg Stance  - 1 x daily - 7 x weekly - 5 reps - 10 hold - Tandem Stance in Corner  - 1 x daily - 7 x weekly - 3 reps - 15 hold - Seated Knee Extension with Resistance  - 1 x daily - 7 x weekly - 2 sets - 10 reps - Seated Hip Abduction with Resistance  - 1 x daily - 7 x weekly - 2 sets - 10 reps  GOALS: Goals reviewed with patient? Yes  SHORT TERM GOALS: Target date: 10/28/23  Patient will be independent with initial HEP. Baseline: given 09/30/23 Goal status: INITIAL  2.  Patient will demonstrate decreased fall risk by scoring < 12 sec on TUG. Baseline: 13s Goal status: INITIAL  3.  Patient will demonstrate SLS >10s on firm and foam surfaces BLE Baseline: 5-10s on firm Goal status: INITIAL   LONG TERM GOALS: Target date: 11/25/23  Patient will be independent with advanced/ongoing HEP to improve outcomes and carryover.  Baseline:  Goal status: INITIAL  2.  Patient will be able to do tandem hold for 30s bilaterally.  Baseline: needs help to step and holds for 15s Goal status: INITIAL  3.  Patient will be able to complete 10 consecutive tandem steps Baseline: 3 steps before LOB Goal status: INITIAL   4.  Patient will demonstrate improved functional LE strength as demonstrated by 5xSTS from chair height or lowest level mat <14s. Baseline: 15s from elevated mat w/LOB, unable to do from chair height  Goal status: INITIAL  5.  Patient will increase quad strength to 5/5  Baseline: 3+ BLE Goal status: INITIAL   ASSESSMENT:  CLINICAL IMPRESSION: Patient is a 23 y.o. male who was seen today for physical therapy treatment for  encephalitis. He was in the hospital for over a month and attending inpatient rehab to address his reduced mobility and unspecified convulsions. He enters ~ 15 minutes late for today's session. He reports endurance as his biggest limitation. Pt had some instability with step ups using airex. Calf tightness reported with heel raises. Increase resistance tolerated with HS curls. He will benefit from PT to address his strength and balance deficits to improve his stability and mobility to decrease his risk for falls.    OBJECTIVE IMPAIRMENTS: Abnormal gait, decreased balance, decreased coordination, decreased strength, and decreased safety awareness.   ACTIVITY LIMITATIONS: locomotion level    PERSONAL FACTORS: Age, Behavior pattern, Fitness, Past/current experiences, Time since onset of injury/illness/exacerbation, and 3+ comorbidities: ADD, encephalitis, trach, PEG are also affecting patient's functional outcome.   REHAB POTENTIAL: Good  CLINICAL DECISION MAKING: Stable/uncomplicated  EVALUATION COMPLEXITY: Low  PLAN:  PT FREQUENCY: 1-2x/week  PT DURATION: 8 weeks  PLANNED INTERVENTIONS: 97110-Therapeutic exercises, 97530- Therapeutic activity, [id number]- Neuromuscular re-education, 97535- Self Care,  16109- Manual therapy, [phone]- Gait training, Patient/Family education, Balance training, Stair training, Cryotherapy, and Moist heat  PLAN FOR NEXT SESSION: strength and balance training   Ollen Beverage, PTA 10/17/2023, 9:42 AM

## 2023-10-17 NOTE — Therapy (Signed)
 OUTPATIENT OCCUPATIONAL THERAPY NEURO Treatment  Patient Name: Colton Zhang MRN: [id number] DOB:07/30/2000, 23 y.o., male Today's Date: 10/17/2023  PCP: Velton Gibbon PA-C REFERRING PROVIDER: Dr. 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    Past Surgical History:  Procedure Laterality Date   CIRCUMCISION     GASTROSTOMY W/ FEEDING TUBE     TRACHEOSTOMY     Patient Active Problem List   Diagnosis Date Noted   Acute hypoxic respiratory failure (HCC) 11/25/2022   Status epilepticus (HCC) 11/25/2022   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   Asthma, chronic 11/18/2022   Hypertensive urgency 11/18/2022   Headache 09/16/2013   Migraine without aura 09/16/2013   ADD (attention deficit disorder) 09/16/2013   Transient alteration of awareness 09/16/2013    ONSET DATE: 09/24/23  REFERRING DIAG:  Diagnosis  Z74.09 (ICD-10-CM) - Other reduced mobility  Z78.9 (ICD-10-CM) - Other specified health status  G04.81 (ICD-10-CM) - Other encephalitis and encephalomyelitis    THERAPY DIAG:  Muscle weakness (generalized)  Frontal lobe and executive function deficit  Other lack of coordination  Attention and concentration deficit  Unsteadiness on feet  Rationale for Evaluation and Treatment: Rehabilitation  SUBJECTIVE:   SUBJECTIVE STATEMENT: 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   Pt  denies pain  Pt accompanied by: family member  PERTINENT HISTORY: Colton Zhang is a 23 y.o. left-hand-dominant male with PMHx significant for asthma, ADD, chronic hepatitis B, MDR Pseudomonas infections, autoimmune encephalitis, and seizure who was admitted to [facility] on 08/01/2023 with increased seizure frequency. He was transferred to [facility] on 08/03/23 for management of focal status epilepticus. He required intubation with escalation of antiepileptic agents. He was started on rituximab  for autoimmune encephalitis and then given cyclophosphamide. He was taken to the OR on 08/22/2023 by Dr. Dave Erie for percutaneous tracheostomy and PEG. The patient's hospital course was complicated by MSSA ventilator associated pneumonia, acute left femoral DVT, macular rash on chest, transaminitis, pancytopenia, central diabetes insipidus, stage II sacral pressure ulcer, and hypotension.  PRECAUTIONS: Fall, no driving due to seizures, PEG, capped trach  WEIGHT BEARING RESTRICTIONS: No  PAIN: Denies pain    FALLS: Has patient fallen in last 6 months? No  LIVING ENVIRONMENT: Lives with: lives with their family   PLOF: Independent  PATIENT GOALS: improve UE strength  OBJECTIVE:  Note: Objective measures were completed at Evaluation unless otherwise noted.  HAND DOMINANCE: Left  ADLs: Overall ADLs: mod I with all basic ADLs Transfers/ambulation related to ADLs: Eating: mod I Tub Shower transfers: mod I steps into tub   Pt was working in a warehouse prior to hospitalization  MOBILITY STATUS: Independent  POSTURE COMMENTS:  rounded shoulders and forward head   ACTIVITY TOLERANCE: Activity tolerance: decreased activity tolerance overall   UPPER EXTREMITY ROM:  LUE WFLs  Active ROM Right eval Left eval  Shoulder flexion 105   Shoulder abduction 70   Shoulder adduction    Shoulder extension    Shoulder internal rotation    Shoulder external rotation     Elbow flexion WFL   Elbow extension -15   Wrist flexion    Wrist extension    Wrist ulnar deviation    Wrist radial deviation    Wrist pronation WFL   Wrist supination WFL   (Blank rows = not tested)  UPPER EXTREMITY MMT:     MMT Right eval Left eval  Shoulder flexion 3+/5 4-/5  Shoulder abduction    Shoulder adduction    Shoulder extension    Shoulder internal rotation    Shoulder external rotation    Middle trapezius    Lower trapezius    Elbow flexion    Elbow extension    Wrist flexion    Wrist extension    Wrist ulnar deviation    Wrist radial deviation    Wrist pronation    Wrist supination    (Blank rows = not tested)  HAND FUNCTION: Grip strength: Right: 72 lbs; Left: 90 lbs  COORDINATION: 9 Hole Peg test: Right: 21.16 sec; Left: 26.08, 25.53 with drops sec  SENSATION: WFL   COGNITION: Overall cognitive status: Impaired, pt's brother reports delayed processing speed. Pt with decreased awareness of higher level cognitve deficits.  Pt recalled 3/3/ words following a dealy. He completed Trail making B correctly, however pt previously perfromed alternating attention test with ST in prior session and did not complete. OT will further assess alternating attention in a functional context. Pt was unable to spell WORLD backwards correctly, he transposed o and r.  VISION ASSESSMENT: Not tested- pt denies visual changes hx of diplopia with onset of encephalitis, pt reports this has resolved Bells Test 34/35 items located   OBSERVATIONS: Pt with flat affect accompanied by his brother                                                                                                                             TREATMENT DATE:   10/17/23: Jeananne Mighty exercises in sitting for shoulder flex and abduction for stretch followed by red theraband ex for shoulder flex, ext, rows with min v.c.  Attempted low-range shoulder abduction with red band but d/c due to compensation.     Placing small pegs in pegboard to copy design for incr coordination with L hand and cognition with good accuracy and incr time.  Removing pegs by manipulating/translating 3 in hand with min difficulty.  Constant Therapy Alternating Symbol Match Level 4 for Alternating attention with 92% accuracy and 67.45 sec response time.  Copying PVC design for problem solving and attention for functional tasks.  Pt completed design  with mod cueing initially (to sort pieces first and to use information sheet to problem solve which piece was correct).  Then pt was able to complete mod complex design with 1 cue for 2 omitted pieces at the end and overall incr time.  10/14/23- alternating attention task on constant therapy:Altenating words level 1, 91% accuracy, response time was significantly increased to 153.67 secs Upgraded theraband exercises for rowing and shoulder extension to red band bilateral UE's 15 reps each min v.c Therapsit checked several goals see below. Ambulating while tossing a ball and naming animals for each letter of the alphabet, min-mod v.c and several LOB. Increased processing time required.  10/07/23- Reviewed HEP issued last visit, 10 reps each exercise min v.c for positioning Checked 9 hole peg test left per pt request. Gripper set at level 4 resistance to address sustained grip for picking up 1 inch blocks, min-mod difficulty, drops. Alternating attention task contstant therapy alternating symbols level 4, 94% accuracy    PATIENT EDUCATION: Education details:  Reviewed red theraband HEP (rows and shoulder extension) and added shoulder flex Person educated: Patient and   Education method: Explanation, demonstration, v.c Education comprehension: verbalized understanding, returned demonstration, needs reinforcement  HOME EXERCISE PROGRAM:  10/02/23- inital HEP, cane T-band  10/14/23- red t-band for shoulder extension and rows Theraband x15 each 10/17/23  added shoulder flex with  red theraband  GOALS: Goals reviewed with patient? Yes  SHORT TERM GOALS: Target date: 10/31/23  I with inital HEP Goal status: met, 10/08/23  2.  Pt will demonstrate ability to retreive a lightweight object at 110* shoulder flexion with RUE Goal status: met, 115* 10/14/23  3.  Pt will increase RUE grip strength by by 10 lbs for increased RUE functional use. Goal status: met, 85 lbs, 90 lbs 10/14/23  4.  Pt will perform an alternating attention task with 90% or better accuracy in rep for work activities.  Goal status: ongoing, 91% today, level 1 for alternating words, increased processing time required. 10/14/23  5.  Pt will improve LUE 9 hole peg test score by 3 secs without drops for increased ease with ADLs.  Goal status: ongoing, 24.36, 1 drop, 19.98 10/08/23    LONG TERM GOALS: Target date: 12/23/23  I with updated HEP Goal status: INITIAL  2.  Pt will demonstrate ability to retreive a  5 lbs object at 115* shoulder flexion with RUE demonstrating good control. Goal status: INITIAL  3.  Pt will demonstrate ability to perfom a physical and cognitve task simultaneously with 90% or better accuracy in prep for eventual driving and return to work. Goal status: ongoing, min-mod v.c 10/14/23  4.  Pt will perfrom simulated work activities mod I demonstrating good safety and positioning. Goal status: INITIAL  5.  Pt will perform prior level of home management and cooking mod I Goal status: INITIAL    ASSESSMENT:  CLINICAL IMPRESSION:  Pt is progressing towards goals. He demonstrates improving UE strength.  Pt does need incr time to process and complete cognitive tasks.   PERFORMANCE DEFICITS: in functional skills including ADLs, IADLs, coordination, dexterity, ROM, strength, flexibility, Fine motor control, Gross motor control, mobility, balance, endurance, decreased knowledge of precautions, decreased knowledge of use of DME, vision, and UE functional use, cognitive skills  including attention, energy/drive, problem solving, safety awareness, sequencing, temperament/personality, thought, and understand, and psychosocial skills including coping strategies, environmental adaptation, habits, interpersonal interactions, and routines and behaviors.   IMPAIRMENTS: are limiting patient from ADLs, IADLs, work, play,  leisure, and social participation.   CO-MORBIDITIES: may have co-morbidities  that affects occupational performance. Patient will benefit from skilled OT to address above impairments and improve overall function.  MODIFICATION OR ASSISTANCE TO COMPLETE EVALUATION: No modification of tasks or assist necessary to complete an evaluation.  OT OCCUPATIONAL PROFILE AND HISTORY: Detailed assessment: Review of records and additional review of physical, cognitive, psychosocial history related to current functional performance.  CLINICAL DECISION MAKING: LOW - limited treatment options, no task modification necessary  REHAB POTENTIAL: Good  EVALUATION COMPLEXITY: Low    PLAN:  OT FREQUENCY: 12 visits   OT DURATION: 12 weeks ( to account for scheduling)  PLANNED INTERVENTIONS: 97168 OT Re-evaluation, 97535 self care/ADL training, 86578 therapeutic exercise, 97530 therapeutic activity, 97112 neuromuscular re-education, 97140 manual therapy, 97116 gait training, 46962 aquatic therapy, 97035 ultrasound, 97018 paraffin, 95284 fluidotherapy, 97010 moist heat, 97010 cryotherapy, 97129 Cognitive training (first 15 min), 13244 Cognitive training(each additional 15 min), passive range of motion, balance training, functional mobility training, visual/perceptual remediation/compensation, energy conservation, coping strategies training, patient/family education, and DME and/or AE instructions  RECOMMENDED OTHER SERVICES: n/a  CONSULTED AND AGREED WITH PLAN OF CARE: Patient and family member/caregiver  PLAN FOR NEXT SESSION:   continue to address strength and functional  reach, higher-level cognition.   Zipporah Finamore, OTR/L 10/17/2023, 11:02 AM

## 2023-10-21 ENCOUNTER — Ambulatory Visit: Admitting: Physical Therapy

## 2023-10-21 ENCOUNTER — Ambulatory Visit: Admitting: Occupational Therapy

## 2023-10-23 ENCOUNTER — Ambulatory Visit: Admitting: Occupational Therapy

## 2023-10-23 ENCOUNTER — Ambulatory Visit: Admitting: Physical Therapy

## 2023-10-23 ENCOUNTER — Ambulatory Visit: Admitting: Speech Pathology

## 2023-10-24 ENCOUNTER — Encounter (HOSPITAL_COMMUNITY): Payer: Self-pay

## 2023-10-24 ENCOUNTER — Emergency Department (HOSPITAL_COMMUNITY)
Admission: EM | Admit: 2023-10-24 | Discharge: 2023-10-25 | Disposition: A | Discharge status: Home / Self Care | Attending: Emergency Medicine | Admitting: Emergency Medicine

## 2023-10-24 ENCOUNTER — Other Ambulatory Visit: Payer: Self-pay

## 2023-10-24 DIAGNOSIS — M25472 Effusion, left ankle: Secondary | ICD-10-CM | POA: Insufficient documentation

## 2023-10-24 DIAGNOSIS — X58XXXA Exposure to other specified factors, initial encounter: Secondary | ICD-10-CM | POA: Diagnosis not present

## 2023-10-24 DIAGNOSIS — M25572 Pain in left ankle and joints of left foot: Secondary | ICD-10-CM | POA: Diagnosis present

## 2023-10-24 DIAGNOSIS — Y9367 Activity, basketball: Secondary | ICD-10-CM | POA: Diagnosis not present

## 2023-10-24 NOTE — ED Provider Notes (Signed)
 [place] EMERGENCY DEPARTMENT AT [facility] Provider Note   CSN: [id number] Arrival date & time: 10/24/23  2005     History  Chief Complaint  Patient presents with   Ankle Pain   HPI Colton Zhang is a 23 y.o. male with PMH of autoimmune encephalitis, DVT, seizures presenting for left ankle pain and swelling.  Symptoms started after playing basketball Sunday night.  Patient states he cannot recall twisting or hurting his ankle during away.  States that later that night when he went to bed around 9 PM he started to feel pain in the inner part of his ankle and noted swelling later that evening.  This morning it was more swollen and he was unable to bear weight.  He placed his ankle in a cam boot which he had from a previous ankle injury.  Pain does radiate up to about the mid shin.  He went to [facility] urgent care earlier today and he stated that his labs were concerning "for infection" which prompted his evaluation here.  Denies chest pain and shortness of breath.   Ankle Pain      Home Medications Prior to Admission medications   Medication Sig Start Date End Date Taking? Authorizing Provider  acetaminophen  (TYLENOL ) 325 MG tablet Take 2 tablets (650 mg total) by mouth every 4 (four) hours as needed (discomfort or fever). 11/27/22   Agarwala, Ravi, MD  amLODipine (NORVASC) 5 MG tablet Take 5 mg by mouth daily. 07/09/23   [provider]  busPIRone (BUSPAR) 5 MG tablet Take 5 mg by mouth 3 (three) times daily.    [provider]  carvedilol (COREG) 6.25 MG tablet Take 3.125-6.25 mg by mouth See admin instructions. Take 3.125mg  (1/2 tablet) by mouth every morning and 6.25mg  (1 tablet) every evening. 04/10/23   [provider]  cloBAZam  (ONFI ) 10 MG tablet Take 10 mg by mouth 2 (two) times daily.    [provider]  gabapentin  (NEURONTIN ) 300 MG capsule Take 900 mg by mouth 3 (three) times daily.    [provider]   levETIRAcetam  (KEPPRA ) 1000 MG tablet Take 2,000 mg by mouth 2 (two) times daily.    [provider]  Midazolam  5 MG/0.1ML SOLN Place 1 each into the nose once as needed (Seizure). 07/10/23   [provider]  ondansetron  (ZOFRAN -ODT) 4 MG disintegrating tablet Take 1 tablet (4 mg total) by mouth every 8 (eight) hours as needed for nausea or vomiting. 07/29/23   Reddick, Johnathan B, NP  promethazine  (PHENERGAN ) 25 MG tablet Take 1 tablet (25 mg total) by mouth every 6 (six) hours as needed for nausea. 07/31/23   Debbra Fairy, PA-C      Allergies    Other    Review of Systems   See HPI  Physical Exam Updated Vital Signs BP (!) 137/99   Pulse (!) 102   Temp 99.4 F (37.4 C) (Oral)   Resp 16   Ht 5\' 11"  (1.803 m)   Wt 88.9 kg   SpO2 99%   BMI 27.34 kg/m  Physical Exam Constitutional:      Appearance: Normal appearance.  HENT:     Head: Normocephalic.     Nose: Nose normal.  Eyes:     Conjunctiva/sclera: Conjunctivae normal.  Pulmonary:     Effort: Pulmonary effort is normal.  Musculoskeletal:     Right ankle: Normal. Normal pulse.     Left ankle: Swelling present. No deformity or ecchymosis. Tenderness  present over the lateral malleolus and medial malleolus. Decreased range of motion.     Comments: Left radial pulses 1+.   Neurological:     Mental Status: He is alert.  Psychiatric:        Mood and Affect: Mood normal.     ED Results / Procedures / Treatments   Labs (all labs ordered are listed, but only abnormal results are displayed) Labs Reviewed - No data to display  EKG None  Radiology No results found.  Procedures Procedures    Medications Ordered in ED Medications - No data to display  ED Course/ Medical Decision Making/ A&P Clinical Course as of 10/26/23 1404  Sat Oct 25, 2023  1209 Synovial cell count + diff, w/ crystals(!) [LS]    Clinical Course User Index [LS] [name], PA-C                                  Medical  Decision Making Amount and/or Complexity of Data Reviewed Labs: ordered. Decision-making details documented in ED Course.  Risk Prescription drug management.   23 year old well-appearing male presenting for left ankle pain and swelling.  Exam notable for significant edema and tenderness with limited range of motion of the left ankle.  HPI is not convincing for trauma related injury prompting concern for septic joint.  Discussed patient with Dr. Maryjane Snider of orthopedics who advised MRI and joint aspiration with cell count and culture.  I reviewed his labs drawn earlier today which revealed elevation of CRP and sed rate but white blood cell count is negative and patient is afebrile at this time. Xrays at urgent care were negative for fracture and dislocation. Transferred by POV to [facility] ED.  Dr. Amalia Jung is excepting attending.  Dr. Maryjane Snider also advised that he would be available in consult and recommended to proceed with joint aspiration if ED provider there is able to do the procedure.        Final Clinical Impression(s) / ED Diagnoses Final diagnoses:  Left ankle pain, unspecified chronicity    Rx / DC Orders ED Discharge Orders     None         [name], PA-C 10/26/23 1405    Trish Furl, MD 10/30/23 [phone]

## 2023-10-24 NOTE — ED Provider Notes (Incomplete)
 [place] EMERGENCY DEPARTMENT AT [facility] Provider Note   CSN: [id number] Arrival date & time: 10/24/23  2005     History {Add pertinent medical, surgical, social history, OB history to HPI:1} Chief Complaint  Patient presents with   Ankle Pain   HPI [name] is a 23 y.o. male.   Ankle Pain      Home Medications Prior to Admission medications   Medication Sig Start Date End Date Taking? Authorizing Provider  acetaminophen  (TYLENOL ) 325 MG tablet Take 2 tablets (650 mg total) by mouth every 4 (four) hours as needed (discomfort or fever). 11/27/22   Agarwala, Ravi, MD  amLODipine (NORVASC) 5 MG tablet Take 5 mg by mouth daily. 07/09/23   [provider]  busPIRone (BUSPAR) 5 MG tablet Take 5 mg by mouth 3 (three) times daily.    [provider]  carvedilol (COREG) 6.25 MG tablet Take 3.125-6.25 mg by mouth See admin instructions. Take 3.125mg  (1/2 tablet) by mouth every morning and 6.25mg  (1 tablet) every evening. 04/10/23   [provider]  cloBAZam  (ONFI ) 10 MG tablet Take 10 mg by mouth 2 (two) times daily.    [provider]  gabapentin  (NEURONTIN ) 300 MG capsule Take 900 mg by mouth 3 (three) times daily.    [provider]  levETIRAcetam  (KEPPRA ) 1000 MG tablet Take 2,000 mg by mouth 2 (two) times daily.    [provider]  Midazolam  5 MG/0.1ML SOLN Place 1 each into the nose once as needed (Seizure). 07/10/23   [provider]  ondansetron  (ZOFRAN -ODT) 4 MG disintegrating tablet Take 1 tablet (4 mg total) by mouth every 8 (eight) hours as needed for nausea or vomiting. 07/29/23   Reddick, Johnathan B, NP  promethazine  (PHENERGAN ) 25 MG tablet Take 1 tablet (25 mg total) by mouth every 6 (six) hours as needed for nausea. 07/31/23   Debbra Fairy, PA-C      Allergies    Other    Review of Systems   Review of Systems  Physical Exam Updated Vital Signs BP 139/88   Pulse 98   Temp 98.4 F (36.9  C) (Oral)   Resp 19   Ht 5\' 11"  (1.803 m)   Wt 88.9 kg   SpO2 100%   BMI 27.34 kg/m  Physical Exam  ED Results / Procedures / Treatments   Labs (all labs ordered are listed, but only abnormal results are displayed) Labs Reviewed - No data to display  EKG None  Radiology No results found.  Procedures Procedures  {Document cardiac monitor, telemetry assessment procedure when appropriate:1}  Medications Ordered in ED Medications - No data to display  ED Course/ Medical Decision Making/ A&P   {   Click here for ABCD2, HEART and other calculatorsREFRESH Note before signing :1}                              Medical Decision Making  ***  {Document critical care time when appropriate:1} {Document review of labs and clinical decision tools ie heart score, Chads2Vasc2 etc:1}  {Document your independent review of radiology images, and any outside records:1} {Document your discussion with family members, caretakers, and with consultants:1} {Document social determinants of health affecting pt's care:1} {Document your decision making why or why not admission, treatments were needed:1} Final Clinical Impression(s) / ED Diagnoses Final diagnoses:  None    Rx / DC Orders ED Discharge Orders  None

## 2023-10-24 NOTE — ED Provider Notes (Signed)
 [place] EMERGENCY DEPARTMENT AT [facility] Provider Note   CSN: [id number] Arrival date & time: 10/24/23  2005     History  Chief Complaint  Patient presents with   Ankle Pain    Colton Zhang is a 23 y.o. male.  He is sent from [facility] emergency department for further evaluation for possible septic arthritis of the left ankle.  He reports he started having some pain Sunday night, no injury or trauma, though he had been playing basketball.  He has been having gradual pain and swelling, today not able to bear any weight, not able to range the ankle.  He went to urgent care today and was sent in for concern for possible infection.  Labs in the ED today showed no leukocytosis, negative D-dimer, sed rate 39, CRP 198.  Orthopedics was consulted and recommended MRI and joint aspiration.   Ankle Pain      Home Medications Prior to Admission medications   Medication Sig Start Date End Date Taking? Authorizing Provider  acetaminophen  (TYLENOL ) 325 MG tablet Take 2 tablets (650 mg total) by mouth every 4 (four) hours as needed (discomfort or fever). 11/27/22   Agarwala, Ravi, MD  amLODipine (NORVASC) 5 MG tablet Take 5 mg by mouth daily. 07/09/23   [provider]  busPIRone (BUSPAR) 5 MG tablet Take 5 mg by mouth 3 (three) times daily.    [provider]  carvedilol (COREG) 6.25 MG tablet Take 3.125-6.25 mg by mouth See admin instructions. Take 3.125mg  (1/2 tablet) by mouth every morning and 6.25mg  (1 tablet) every evening. 04/10/23   [provider]  cloBAZam  (ONFI ) 10 MG tablet Take 10 mg by mouth 2 (two) times daily.    [provider]  gabapentin  (NEURONTIN ) 300 MG capsule Take 900 mg by mouth 3 (three) times daily.    [provider]  levETIRAcetam  (KEPPRA ) 1000 MG tablet Take 2,000 mg by mouth 2 (two) times daily.    [provider]  Midazolam  5 MG/0.1ML SOLN Place 1 each into the nose once as needed (Seizure).  07/10/23   [provider]  ondansetron  (ZOFRAN -ODT) 4 MG disintegrating tablet Take 1 tablet (4 mg total) by mouth every 8 (eight) hours as needed for nausea or vomiting. 07/29/23   Reddick, Johnathan B, NP  promethazine  (PHENERGAN ) 25 MG tablet Take 1 tablet (25 mg total) by mouth every 6 (six) hours as needed for nausea. 07/31/23   Debbra Fairy, PA-C      Allergies    Other    Review of Systems   Review of Systems  Physical Exam Updated Vital Signs BP (!) 128/98   Pulse 92   Temp 97.9 F (36.6 C)   Resp 20   Ht 5\' 11"  (1.803 m)   Wt 88.9 kg   SpO2 100%   BMI 27.34 kg/m  Physical Exam Vitals and nursing note reviewed.  Constitutional:      General: He is not in acute distress.    Appearance: He is well-developed.  HENT:     Head: Normocephalic and atraumatic.  Eyes:     Extraocular Movements: Extraocular movements intact.     Conjunctiva/sclera: Conjunctivae normal.     Pupils: Pupils are equal, round, and reactive to light.  Cardiovascular:     Rate and Rhythm: Normal rate and regular rhythm.     Heart sounds: No murmur heard. Pulmonary:     Effort: Pulmonary effort is normal. No respiratory distress.  Breath sounds: Normal breath sounds.  Abdominal:     Palpations: Abdomen is soft.     Tenderness: There is no abdominal tenderness.  Musculoskeletal:        General: No swelling.     Cervical back: Neck supple.     Comments: Diffuse edema to left ankle with tenderness over lateral and medial malleoli.  DP pulses intact the left foot, capillary refill is brisk.  There is no overlying cellulitis.  No calf tenderness.  Patient is not able to range the left ankle  Skin:    General: Skin is warm and dry.     Capillary Refill: Capillary refill takes less than 2 seconds.  Neurological:     General: No focal deficit present.     Mental Status: He is alert and oriented to person, place, and time.  Psychiatric:        Mood and Affect: Mood normal.     ED Results  / Procedures / Treatments   Labs (all labs ordered are listed, but only abnormal results are displayed) Labs Reviewed  BASIC METABOLIC PANEL WITH GFR - Abnormal; Notable for the following components:      Result Value   CO2 21 (*)    Anion gap 17 (*)    All other components within normal limits  BODY FLUID CULTURE W GRAM STAIN  CBC WITH DIFFERENTIAL/PLATELET  GLUCOSE, BODY FLUID OTHER            PROTEIN, BODY FLUID (OTHER)  SYNOVIAL CELL COUNT + DIFF, W/ CRYSTALS    EKG None  Radiology MR ANKLE LEFT W WO CONTRAST Result Date: 10/25/2023 CLINICAL DATA:  Septic arthritis suspected, ankle, no prior imaging. Left ankle pain. EXAM: MRI OF THE LEFT ANKLE WITHOUT AND WITH CONTRAST TECHNIQUE: Multiplanar, multisequence MR imaging of the ankle was performed before and after the administration of intravenous contrast. CONTRAST:  9mL GADAVIST  GADOBUTROL  1 MMOL/ML IV SOLN COMPARISON:  10/24/2023. FINDINGS: Peroneal: Peroneal longus tendon intact. Peroneal brevis intact. Posteromedial: Posterior tibialis tendinosis and tenosynovitis. Tenosynovitis of the flexor hallucis longus and flexor digitorum longus tendons at the level of the knot of Henry at the midfoot. Flexor hallucis longus tendon intact. Flexor digitorum longus tendon intact. Anterior: Tibialis anterior tendon intact. Extensor hallucis longus tendon intact Extensor digitorum longus tendon intact. Achilles:  Intact. Plantar Fascia: Intact. LIGAMENTS Lateral: Longitudinal T2 hyperintense signal in the anterior talofibular ligament is suggestive of a partial-thickness tear. Calcaneofibular ligament intact. Posterior talofibular ligament intact. Anterior and posterior tibiofibular ligaments intact. Medial: Deltoid ligament intact. Spring ligament intact. CARTILAGE Ankle Joint: There is a heterogenous tibiotalar joint effusion demonstrating low T2 signal with possible mild blooming artifact and areas of internal heterogenous T2 hyperintensity which  correspond to areas of mild T1 hyperintensity. There is minimal corresponding enhancement. This finding is concerning for a complex tibiotalar joint effusion with hemarthrosis, septic arthritis can not be excluded. There is periarticular edema and enhancement of the talus, most predominantly at the talar dome, body and neck, as well as the lateral malleolus, the tibial plafond and medial malleolus. No chondral defect. Subtalar Joints/Sinus Tarsi: subtalar joint effusion. Normal sinus tarsi. Bones: Periarticular marrow edema and enhancement about the tibiotalar joint, as described above. Mild marrow edema and enhancement of the navicular and to a lesser degree the lateral cuneiform at the level of the navicular-cuneiform articulation. No discrete fracture identified. No discrete erosive changes. No dislocation. Soft Tissue: Partially visualized feathery edema within the extensor digitorum longus muscle, most compatible with  grade 1 strain. Subcutaneous edema extending along the medial and lateral ankle. No fluid collection. Tarsal tunnel is normal. IMPRESSION: IMPRESSION 1. Findings concerning for a complex tibiotalar joint effusion with hemarthrosis. Septic arthritis/osteomyelitis can not be excluded. Periarticular marrow edema and enhancement of the talus, the lateral malleolus, the tibial plafond and medial malleolus, could be secondary to complex joint effusion with possible septic arthritis or reflect posttraumatic contusion if there is recent history of trauma. 2. Mild marrow edema and enhancement of the navicular and to a lesser degree the lateral cuneiform at the level of the navicular-cuneiform articulation could reflect posttraumatic contusion, however, osteomyelitis can not be entirely excluded. 3. Posterior tibialis tendinosis and tenosynovitis. Tenosynovitis of the flexor hallucis longus and flexor digitorum longus tendons at the level of the knot of Henry at the midfoot. 4. Findings suggestive of  partial-thickness tear of the anterior talofibular ligament. 5. Grade 1 strain of the extensor digitorum longus muscle. Electronically Signed   By: Mannie Seek M.D.   On: 10/25/2023 05:43    Procedures .Joint Aspiration/Arthrocentesis  Date/Time: 10/25/2023 6:59 AM  Performed by: Aimee Houseman, PA-C Authorized by: Aimee Houseman, PA-C   Consent:    Consent obtained:  Verbal   Consent given by:  Patient   Risks discussed:  Bleeding, infection, pain, incomplete drainage and nerve damage Universal protocol:    Test results available: yes     Imaging studies available: yes     Patient identity confirmed:  Verbally with patient Location:    Location:  Ankle   Ankle:  L ankle Anesthesia:    Anesthesia method:  Local infiltration   Local anesthetic:  Lidocaine  1% w/o epi Procedure details:    Preparation: Patient was prepped and draped in usual sterile fashion     Needle gauge:  18 G   Ultrasound guidance: yes     Approach:  Medial   Aspirate characteristics:  Bloody   Steroid injected: no     Specimen collected: yes   Post-procedure details:    Dressing:  Adhesive bandage   Procedure completion:  Tolerated well, no immediate complications     Medications Ordered in ED Medications  gadobutrol  (GADAVIST ) 1 MMOL/ML injection 9 mL (9 mLs Intravenous Contrast Given 10/25/23 0153)  lidocaine  (PF) (XYLOCAINE ) 1 % injection 30 mL (30 mLs Other Given by Other 10/25/23 1610)    ED Course/ Medical Decision Making/ A&P                                 Medical Decision Making This patient presents to the ED for concern of possible septic arthritis of the right ankle, Problem List / ED Course / Critical interventions / Medication management  Patient seen at [facility] and transferred to ED here for MRI and joint aspiration for possible septic arthritis of the left ankle.  He went to urgent care today and was sent in for concern for possible infection.  Labs in the  ED today showed no leukocytosis, negative D-dimer, sed rate 39, CRP 198.  Orthopedics was consulted and recommended MRI and joint aspiration.  Due to no MRI at [facility], he was sent to [facility].  MRI resulted and showed a complex joint effusion possibly hemarthrosis versus septic joint.  Also showed an ATF partial tear.  Joint aspiration revealed a small amount of blood.  Signed out to Lynnie Saucier, PA-C for consult back to Dr.  Reynolds regarding plan of action.  I have reviewed the patients home medicines and have made adjustments as needed   Amount and/or Complexity of Data Reviewed Labs: ordered.  Risk Prescription drug management.           Final Clinical Impression(s) / ED Diagnoses Final diagnoses:  Left ankle pain, unspecified chronicity    Rx / DC Orders ED Discharge Orders     None         Aimee Houseman, PA-C 10/25/23 0703    Ballard Bongo, MD 10/26/23 [phone]

## 2023-10-24 NOTE — ED Notes (Signed)
 Report given to [facility] ED. Accepting MD will be Martina Sledge MD.

## 2023-10-24 NOTE — ED Triage Notes (Signed)
 Pt hurt left ankle on Sunday after playing basketball. Pt arrives in a CAM boot that he already had. Reports swelling in ankle since Sunday.

## 2023-10-25 ENCOUNTER — Emergency Department (HOSPITAL_COMMUNITY)

## 2023-10-25 LAB — CBC WITH DIFFERENTIAL/PLATELET
Abs Immature Granulocytes: 0.04 10*3/uL (ref 0.00–0.07)
Basophils Absolute: 0 10*3/uL (ref 0.0–0.1)
Basophils Relative: 1 %
Eosinophils Absolute: 0 10*3/uL (ref 0.0–0.5)
Eosinophils Relative: 1 %
HCT: 40.6 % (ref 39.0–52.0)
Hemoglobin: 13.7 g/dL (ref 13.0–17.0)
Immature Granulocytes: 1 %
Lymphocytes Relative: 15 %
Lymphs Abs: 0.9 10*3/uL (ref 0.7–4.0)
MCH: 28.5 pg (ref 26.0–34.0)
MCHC: 33.7 g/dL (ref 30.0–36.0)
MCV: 84.4 fL (ref 80.0–100.0)
Monocytes Absolute: 0.9 10*3/uL (ref 0.1–1.0)
Monocytes Relative: 17 %
Neutro Abs: 3.7 10*3/uL (ref 1.7–7.7)
Neutrophils Relative %: 65 %
Platelets: 331 10*3/uL (ref 150–400)
RBC: 4.81 MIL/uL (ref 4.22–5.81)
RDW: 13.5 % (ref 11.5–15.5)
WBC: 5.6 10*3/uL (ref 4.0–10.5)
nRBC: 0 % (ref 0.0–0.2)

## 2023-10-25 LAB — SYNOVIAL CELL COUNT + DIFF, W/ CRYSTALS
Crystals, Fluid: NONE SEEN
Eosinophils-Synovial: 1 % (ref 0–1)
Lymphocytes-Synovial Fld: 73 % — ABNORMAL HIGH (ref 0–20)
Monocyte-Macrophage-Synovial Fluid: 1 % — ABNORMAL LOW (ref 50–90)
Neutrophil, Synovial: 25 % (ref 0–25)
WBC, Synovial: 403 /mm3 — ABNORMAL HIGH (ref 0–200)

## 2023-10-25 LAB — BASIC METABOLIC PANEL WITH GFR
Anion gap: 17 — ABNORMAL HIGH (ref 5–15)
BUN: 6 mg/dL (ref 6–20)
CO2: 21 mmol/L — ABNORMAL LOW (ref 22–32)
Calcium: 9.4 mg/dL (ref 8.9–10.3)
Chloride: 102 mmol/L (ref 98–111)
Creatinine, Ser: 0.74 mg/dL (ref 0.61–1.24)
GFR, Estimated: >60 mL/min (ref >60)
Glucose, Bld: 86 mg/dL (ref 70–99)
Potassium: 3.9 mmol/L (ref 3.5–5.1)
Sodium: 140 mmol/L (ref 135–145)

## 2023-10-25 MED ORDER — LIDOCAINE HCL (PF) 1 % IJ SOLN
30.0000 mL | Freq: Once | INTRAMUSCULAR | Status: AC
  Administered 2023-10-25: 30 mL
  Filled 2023-10-25: qty 30

## 2023-10-25 MED ORDER — GADOBUTROL 1 MMOL/ML IV SOLN
9.0000 mL | Freq: Once | INTRAVENOUS | Status: AC | PRN
  Administered 2023-10-25: 9 mL via INTRAVENOUS

## 2023-10-25 MED ORDER — HYDROMORPHONE HCL 1 MG/ML IJ SOLN
0.5000 mg | Freq: Once | INTRAMUSCULAR | Status: AC
  Administered 2023-10-25: 0.5 mg via INTRAVENOUS
  Filled 2023-10-25: qty 1

## 2023-10-25 MED ORDER — HYDROCODONE-ACETAMINOPHEN 5-325 MG PO TABS: 1.0000 | ORAL_TABLET | ORAL | 0 refills | Status: AC | PRN

## 2023-10-25 NOTE — ED Notes (Signed)
 Pt given crutches, ankle wrapped and cam boot applied. Pt wheeled to car with family. Verbalized understanding of discharge instructions.

## 2023-10-25 NOTE — ED Notes (Signed)
 Arthrocentesis started.

## 2023-10-25 NOTE — ED Provider Notes (Signed)
 Patient's care assumed at 6:30 AM.  Patient has been evaluated for infection of left ankle.  Patient has had an MRI which shows a partial tear of anterior talofibular ligament and joint effusion. Patient had needle aspiration by previous provider.  Gram stain and cell count are pending. Dr. Maryjane Snider orthopedist on-call has seen and evaluated patient. Gram stain shows no organisms Synovial cell count shows 403 white blood cells neutrophils are normal. Dr. Maryjane Snider reviewed results and advised to have patient follow-up in the office on Monday or Tuesday. Patient is given a prescription for hydrocodone.  He is counseled on the need to return to the emergency department if he has increased redness swelling numbness.  He is discharged in stable condition   Colton Zhang K, PA-C 10/25/23 1228    Colton Arias, MD 10/27/23 [phone]

## 2023-10-25 NOTE — ED Notes (Signed)
 Ortho MD at bedside.

## 2023-10-25 NOTE — Consult Note (Signed)
 Orthopedic Consultation Note  Current Hospital Day : Hospital Day: 2  Reason For Consult: Left ankle pain  History of Present Illness:  Colton Zhang is a 23 y.o. male who presented to urgent care yesterday due to increasing left ankle pain.  It began hurting after playing basketball.  Labs taken at urgent care demonstrated elevated CRP and he was referred to the emergency department for consideration of possible septic arthritis.  MRI and aspiration were obtained.  Orthopedics was consulted for further guidance on management.  Presently patient is somewhat sleepy and not participatory in a detailed history.  He endorses pain in the left ankle and no pain elsewhere.  Denies history of any recent infections, traumatic wounds and no likelihood of recent STD.  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 Authorizing Provider  acetaminophen  (TYLENOL ) 325 MG tablet Take 2 tablets (650 mg total) by mouth every 4 (four) hours as needed (discomfort or fever). 11/27/22   Agarwala, Ravi, MD  amLODipine (NORVASC) 5 MG tablet Take 5 mg by mouth daily. 07/09/23   [provider]  busPIRone (BUSPAR) 5 MG tablet Take 5 mg by mouth 3 (three) times daily.    [provider]  carvedilol (COREG) 6.25 MG tablet Take 3.125-6.25 mg by mouth See admin instructions. Take 3.125mg  (1/2 tablet) by mouth every morning and 6.25mg  (1 tablet) every evening. 04/10/23   [provider]  cloBAZam  (ONFI ) 10 MG tablet Take 10 mg by mouth 2 (two) times daily.    [provider]  gabapentin  (NEURONTIN ) 300 MG capsule Take 900 mg by mouth 3 (three) times daily.    [provider]  levETIRAcetam  (KEPPRA ) 1000 MG  tablet Take 2,000 mg by mouth 2 (two) times daily.    [provider]  Midazolam  5 MG/0.1ML SOLN Place 1 each into the nose once as needed (Seizure). 07/10/23   [provider]  ondansetron  (ZOFRAN -ODT) 4 MG disintegrating tablet Take 1 tablet (4 mg total) by mouth every 8 (eight) hours as needed for nausea or vomiting. 07/29/23   Reddick, Johnathan B, NP  promethazine  (PHENERGAN ) 25 MG tablet Take 1 tablet (25 mg total) by mouth every 6 (six) hours as needed for nausea. 07/31/23   Debbra Fairy, PA-C    Physical Examination Left Lower Extremity: Skin is intact, no erythema or warmth No pain with short arc range of motion of the ankle passively He does have quite a bit of pain with more full range of motion either actively or passively There are no open wounds + ankle dorsiflexion/plantarflexion/EHL SILT SP/DP/T Foot wwp   Imaging: MRI of the left ankle reviewed and interpreted demonstrating joint effusion with periarticular increased T2 signal.  Assessment:   Colton Zhang is a 23 y.o. male with left ankle pain.  His MRI and aspiration are consistent with hemarthrosis.  His pain began after playing basketball, most likely diagnosis is traumatic hemarthrosis.  He has no appearance of infection on his clinical exam.  His ESR and CBC are essentially normal.  The only contradictory finding is his elevated CRP which is possibly from another source.  We discussed potential operative  irrigation and debridement versus close monitoring and symptomatic care.  I think that is the most prudent course given his low cell count and neutrophil percentage on aspiration.  I will have him follow back up in 2 days for close observation.  Plan:   OK for WBAT Ice, elevate, antiinflammatories  Follow up on Monday. [phone]

## 2023-10-25 NOTE — ED Notes (Signed)
 Arthrocentesis finished.

## 2023-10-25 NOTE — Discharge Instructions (Addendum)
 Elevate foot, pain medication as needed.  Return if increased swelling or pain

## 2023-10-25 NOTE — ED Notes (Signed)
 Patient transported to MRI

## 2023-10-26 LAB — GLUCOSE, BODY FLUID OTHER: Glucose, Body Fluid Other: 44 mg/dL

## 2023-10-28 LAB — BODY FLUID CULTURE W GRAM STAIN

## 2023-11-10 ENCOUNTER — Inpatient Hospital Stay: Attending: Hematology and Oncology | Admitting: Hematology and Oncology

## 2023-11-10 ENCOUNTER — Encounter: Payer: Self-pay | Admitting: Hematology and Oncology

## 2023-11-10 ENCOUNTER — Inpatient Hospital Stay

## 2023-11-10 VITALS — BP 132/81 | HR 73 | Temp 98.1°F | Resp 18 | Ht 71.0 in | Wt 191.8 lb

## 2023-11-10 DIAGNOSIS — A86 Unspecified viral encephalitis: Secondary | ICD-10-CM

## 2023-11-10 DIAGNOSIS — I824Y2 Acute embolism and thrombosis of unspecified deep veins of left proximal lower extremity: Secondary | ICD-10-CM

## 2023-11-10 DIAGNOSIS — Z7901 Long term (current) use of anticoagulants: Secondary | ICD-10-CM | POA: Diagnosis not present

## 2023-11-10 DIAGNOSIS — I82402 Acute embolism and thrombosis of unspecified deep veins of left lower extremity: Secondary | ICD-10-CM | POA: Diagnosis not present

## 2023-11-10 NOTE — Assessment & Plan Note (Signed)
 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

## 2023-11-10 NOTE — Assessment & Plan Note (Signed)
 His recent left lower extremity DVT is provoked by severe illness and prolonged hospitalization He needs to be on anticoagulation therapy for minimum of 3 months Unfortunately, he recently injured his left ankle and is currently wearing a boot that further cause immobility I will contact his brother next month around mid July to see if he is still wearing a boot or not If so, we will delay repeating venous Doppler ultrasound until his boot is off; the ultrasound venous Doppler will help determine duration of anticoagulation therapy For now, due to interaction with multiple different medications, the safest treatment available to treat his acute DVT is Lovenox  and he will remain at the current dose

## 2023-11-10 NOTE — Progress Notes (Signed)
 [facility] CONSULT NOTE  Patient Care Team: Bufford Carne, FNP as PCP - General (Family Medicine)   ASSESSMENT & PLAN:  Acute deep vein thrombosis (DVT) of left lower extremity (HCC) His recent left lower extremity DVT is provoked by severe illness and prolonged hospitalization He needs to be on anticoagulation therapy for minimum of 3 months Unfortunately, he recently injured his left ankle and is currently wearing a boot that further cause immobility I will contact his brother next month around mid July to see if he is still wearing a boot or not If so, we will delay repeating venous Doppler ultrasound until his boot is off; the ultrasound venous Doppler will help determine duration of anticoagulation therapy For now, due to interaction with multiple different medications, the safest treatment available to treat his acute DVT is Lovenox  and he will remain at the current dose  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 The patient knows to call the clinic with any problems, questions or concerns. The total time spent in the appointment was 60 minutes encounter with patients including review of chart and various tests results, discussions about plan of care and coordination of care plan  Almeda Jacobs, MD 6/16/202512:57 PM  CHIEF COMPLAINTS/PURPOSE OF CONSULTATION:  Acute DVT  HISTORY OF PRESENTING ILLNESS:  Colton Zhang 23 y.o. 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  The encephalitis cause refractory status epilepticus and respiratory failure.  The patient was subsequently transferred to [facility] for further management   On December 02, 2022, the patient was found to have acute superficial vein thrombosis of the basilic vein near the antecubital fossa thought to be related to PICC line placement. On December 15, 2022, he was found to have partial occlusive acute deep vein thrombosis of the subclavian vein attached to the PICC line With a protracted hospital stay, the patient ended up with tracheostomy placement, prolonged rehabilitation and immobility  This year, on July 31, 2023, the patient presented to the emergency department with with nausea and vomiting and subsequently was admitted to the hospital with seizure and he developed acute respiratory failure again and was hospitalized until around May of this year He had several ultrasound venous Doppler of both arms and legs performed in the hospital; they are all negative for DVT until ultrasound venous Doppler dated 09/09/2023 showed totally occluding acute deep vein thrombosis of the left femoral vein.  The patient was placed on Lovenox .  Due to interaction with other medications, oral DOAC was not an option for him  His brother administer Lovenox  to the patient. The patient denies any recent signs or symptoms of bleeding such as spontaneous epistaxis, hematuria or hematochezia. Since discharge from the hospital in May, he is physically active.  Unfortunately, he injured his ankle in early May.  MRI showed partial-thickness tear of the anterior talofibular ligament.  He was placed on a boot. 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     SURGICAL HISTORY: Past Surgical History:  Procedure Laterality Date   CIRCUMCISION     GASTROSTOMY W/ FEEDING TUBE     TRACHEOSTOMY      SOCIAL HISTORY: Social History   Socioeconomic History   Marital status: Single    Spouse name: Not on file   Number of children: Not on file   Years of education: Not on file   Highest education level: Not on file  Occupational History   Not on file  Tobacco Use   Smoking status: Never   Smokeless tobacco: Never  Vaping Use   Vaping status: Never Used  Substance and Sexual Activity   Alcohol use: Never   Drug use: Never   Sexual activity: Not Currently    Birth control/protection: Condom  Other Topics Concern   Not on file  Social History Narrative   Not on file   Social Drivers of Health   Financial Resource Strain: Not on file  Food Insecurity: Low Risk  (10/01/2023)   Received from [facility]   Hunger Vital Sign    Within the past 12 months, you worried that your food would run out before you got money to buy more: Never true    Within the past 12 months, the food you bought just didn't last and you didn't have money to get more. : Never true  Transportation Needs: No Transportation Needs (10/01/2023)   Received from [facility]    In the past 12 months, has lack of reliable transportation kept you from medical appointments, meetings, work or from getting things needed for daily living? : No  Recent Concern: Transportation Needs - Unmet Transportation Needs (08/03/2023)   Received from [facility]    In the past 12 months, has lack of reliable transportation kept you from medical appointments, meetings, work or from getting things needed for daily living? : Yes  Physical Activity: Not on file  Stress: Not on file  Social Connections: Not on file  Intimate Partner Violence: Patient Unable To Answer (11/27/2022)   Humiliation, Afraid, Rape, and Kick  questionnaire    Fear of Current or Ex-Partner: Patient unable to answer    Emotionally Abused: Patient unable to answer    Physically Abused: Patient unable to answer    Sexually Abused: Patient unable to answer    FAMILY HISTORY: Family History  Problem Relation Age of Onset   Cirrhosis Father    Migraines Brother    Heart failure Paternal Grandmother    Stroke Paternal Grandfather     ALLERGIES:  is allergic to other.  MEDICATIONS:  Current Outpatient Medications  Medication Sig Dispense Refill   busPIRone (BUSPAR) 10 MG tablet Take 10 mg by mouth 3 (three) times daily.     cloBAZam  (ONFI ) 20 MG tablet Take 20 mg by mouth 2 (two) times daily.     divalproex (DEPAKOTE) 250 MG DR tablet Take 750 mg by mouth daily.     entecavir (BARACLUDE) 0.5 MG tablet Take 0.5 mg by mouth daily.     lacosamide  (VIMPAT ) 200 MG TABS tablet Take 200 mg by mouth 2 (two) times daily.     PHENobarbital  (LUMINAL) 64.8 MG tablet Take 64.8 mg by mouth 3 (three)  times daily.     zonisamide (ZONEGRAN) 100 MG capsule Take 400 mg by mouth daily.     acetaminophen  (TYLENOL ) 500 MG tablet Take 500 mg by mouth every 4 (four) hours as needed for mild pain (pain score 1-3).     amLODipine (NORVASC) 5 MG tablet Take 5 mg by mouth daily.     busPIRone (BUSPAR) 5 MG tablet Take 5 mg by mouth 3 (three) times daily.     carvedilol (COREG) 6.25 MG tablet Take 3.125-6.25 mg by mouth See admin instructions. Take 3.125mg  (1/2 tablet) by mouth every morning and 6.25mg  (1 tablet) every evening.     cloBAZam  (ONFI ) 10 MG tablet Take 10 mg by mouth 2 (two) times daily.     enoxaparin  (LOVENOX ) 100 MG/ML injection Inject 90 mg into the skin every 12 (twelve) hours.     gabapentin  (NEURONTIN ) 300 MG capsule Take 900 mg by mouth 3 (three) times daily.     HYDROcodone -acetaminophen  (NORCO/VICODIN) 5-325 MG tablet Take 1 tablet by mouth every 4 (four) hours as needed. 12 tablet 0   levETIRAcetam  (KEPPRA ) 1000 MG tablet Take 2,000  mg by mouth 2 (two) times daily.     Midazolam  5 MG/0.1ML SOLN Place 1 each into the nose once as needed (Seizure).     ondansetron  (ZOFRAN -ODT) 4 MG disintegrating tablet Take 1 tablet (4 mg total) by mouth every 8 (eight) hours as needed for nausea or vomiting. 20 tablet 0   promethazine  (PHENERGAN ) 25 MG tablet Take 1 tablet (25 mg total) by mouth every 6 (six) hours as needed for nausea. 20 tablet 0   No current facility-administered medications for this visit.    REVIEW OF SYSTEMS:   Constitutional: Denies fevers, chills or abnormal night sweats Eyes: Denies blurriness of vision, double vision or watery eyes Ears, nose, mouth, throat, and face: Denies mucositis or sore throat Respiratory: Denies cough, dyspnea or wheezes Cardiovascular: Denies palpitation, chest discomfort or lower extremity swelling Gastrointestinal:  Denies nausea, heartburn or change in bowel habits Skin: Denies abnormal skin rashes Lymphatics: Denies new lymphadenopathy or easy bruising Neurological:Denies numbness, tingling or new weaknesses Behavioral/Psych: Mood is stable, no new changes  All other systems were reviewed with the patient and are negative.  PHYSICAL EXAMINATION: ECOG PERFORMANCE STATUS: 1 - Symptomatic but completely ambulatory  Vitals:   11/10/23 1119  BP: 132/81  Pulse: 73  Resp: 18  Temp: 98.1 F (36.7 C)  SpO2: 100%   Filed Weights   11/10/23 1119  Weight: 191 lb 12.8 oz (87 kg)    GENERAL:alert, no distress and comfortable SKIN: skin color, texture, turgor are normal, no rashes or significant lesions EYES: normal, conjunctiva are pink and non-injected, sclera clear OROPHARYNX:no exudate, no erythema and lips, buccal mucosa, and tongue normal  NECK: supple, thyroid  normal size, non-tender, without nodularity LYMPH:  no palpable lymphadenopathy in the cervical, axillary or inguinal LUNGS: clear to auscultation and percussion with normal breathing effort HEART: regular rate &  rhythm and no murmurs.  He is wearing a boot to immobilize his left ankle due to recent injury ABDOMEN:abdomen soft, non-tender and normal bowel sounds Musculoskeletal:no cyanosis of digits and no clubbing  PSYCH: alert & oriented x 3 with fluent speech NEURO: no focal motor/sensory deficits  LABORATORY DATA:  I have reviewed the data as listed Lab Results  Component Value Date   WBC 5.6 10/24/2023   HGB 13.7 10/24/2023   HCT 40.6 10/24/2023   MCV 84.4 10/24/2023   PLT  331 10/24/2023     RADIOGRAPHIC STUDIES: I have personally reviewed the radiological images as listed and agreed with the findings in the report. MR ANKLE LEFT W WO CONTRAST Result Date: 10/25/2023 CLINICAL DATA:  Septic arthritis suspected, ankle, no prior imaging. Left ankle pain. EXAM: MRI OF THE LEFT ANKLE WITHOUT AND WITH CONTRAST TECHNIQUE: Multiplanar, multisequence MR imaging of the ankle was performed before and after the administration of intravenous contrast. CONTRAST:  9mL GADAVIST  GADOBUTROL  1 MMOL/ML IV SOLN COMPARISON:  10/24/2023. FINDINGS: Peroneal: Peroneal longus tendon intact. Peroneal brevis intact. Posteromedial: Posterior tibialis tendinosis and tenosynovitis. Tenosynovitis of the flexor hallucis longus and flexor digitorum longus tendons at the level of the knot of Henry at the midfoot. Flexor hallucis longus tendon intact. Flexor digitorum longus tendon intact. Anterior: Tibialis anterior tendon intact. Extensor hallucis longus tendon intact Extensor digitorum longus tendon intact. Achilles:  Intact. Plantar Fascia: Intact. LIGAMENTS Lateral: Longitudinal T2 hyperintense signal in the anterior talofibular ligament is suggestive of a partial-thickness tear. Calcaneofibular ligament intact. Posterior talofibular ligament intact. Anterior and posterior tibiofibular ligaments intact. Medial: Deltoid ligament intact. Spring ligament intact. CARTILAGE Ankle Joint: There is a heterogenous tibiotalar joint  effusion demonstrating low T2 signal with possible mild blooming artifact and areas of internal heterogenous T2 hyperintensity which correspond to areas of mild T1 hyperintensity. There is minimal corresponding enhancement. This finding is concerning for a complex tibiotalar joint effusion with hemarthrosis, septic arthritis can not be excluded. There is periarticular edema and enhancement of the talus, most predominantly at the talar dome, body and neck, as well as the lateral malleolus, the tibial plafond and medial malleolus. No chondral defect. Subtalar Joints/Sinus Tarsi: subtalar joint effusion. Normal sinus tarsi. Bones: Periarticular marrow edema and enhancement about the tibiotalar joint, as described above. Mild marrow edema and enhancement of the navicular and to a lesser degree the lateral cuneiform at the level of the navicular-cuneiform articulation. No discrete fracture identified. No discrete erosive changes. No dislocation. Soft Tissue: Partially visualized feathery edema within the extensor digitorum longus muscle, most compatible with grade 1 strain. Subcutaneous edema extending along the medial and lateral ankle. No fluid collection. Tarsal tunnel is normal. IMPRESSION: IMPRESSION 1. Findings concerning for a complex tibiotalar joint effusion with hemarthrosis. Septic arthritis/osteomyelitis can not be excluded. Periarticular marrow edema and enhancement of the talus, the lateral malleolus, the tibial plafond and medial malleolus, could be secondary to complex joint effusion with possible septic arthritis or reflect posttraumatic contusion if there is recent history of trauma. 2. Mild marrow edema and enhancement of the navicular and to a lesser degree the lateral cuneiform at the level of the navicular-cuneiform articulation could reflect posttraumatic contusion, however, osteomyelitis can not be entirely excluded. 3. Posterior tibialis tendinosis and tenosynovitis. Tenosynovitis of the flexor  hallucis longus and flexor digitorum longus tendons at the level of the knot of Henry at the midfoot. 4. Findings suggestive of partial-thickness tear of the anterior talofibular ligament. 5. Grade 1 strain of the extensor digitorum longus muscle. Electronically Signed   By: Mannie Seek M.D.   On: 10/25/2023 05:43

## 2023-11-10 NOTE — Therapy (Signed)
 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    Past Surgical History:  Procedure Laterality Date   CIRCUMCISION     GASTROSTOMY W/ FEEDING TUBE     TRACHEOSTOMY     Patient Active Problem List   Diagnosis Date Noted   Acute deep vein thrombosis (DVT) of left lower extremity (HCC) 11/10/2023   Acute hypoxic respiratory failure (HCC) 11/25/2022   Status epilepticus (HCC) 11/25/2022   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   Asthma, chronic 11/18/2022   Hypertensive urgency 11/18/2022   Headache 09/16/2013   Migraine without aura 09/16/2013   ADD (attention deficit disorder) 09/16/2013   Transient alteration of awareness 09/16/2013    ONSET DATE: 09/16/23  REFERRING DIAG:  Z74.09 (ICD-10-CM) - Other reduced mobility  Z78.9 (ICD-10-CM) - Other specified health status  R56.9 (ICD-10-CM) - Unspecified convulsions    THERAPY DIAG:  Muscle weakness (generalized)  Other lack of coordination  Unsteadiness on feet  Other abnormalities of gait and mobility  Rationale for Evaluation and Treatment: Rehabilitation  SUBJECTIVE:                                                                                                                                                                                             SUBJECTIVE STATEMENT: Hurt my ankle playing basketball. Partially tore a ligament. I have been in this boot for  2 weeks.   Pt accompanied by: family member brother   PERTINENT HISTORY: Colton Zhang is a 23 y.o. left-hand-dominant male with PMHx significant for asthma, ADD, chronic hepatitis B, MDR Pseudomonas infections, autoimmune encephalitis, and seizure who was admitted to [facility] [place] on 08/01/2023 with increased seizure frequency. He was transferred to [facility] on 08/03/23 for management of focal status epilepticus. He required intubation with escalation of antiepileptic agents. He was started on rituximab  for autoimmune encephalitis and then given cyclophosphamide. He was taken to the OR on 08/22/2023  by Dr. Dave Erie for percutaneous tracheostomy and PEG. The patient's hospital course was complicated by MSSA ventilator associated pneumonia, acute left femoral DVT, macular rash on chest, transaminitis, pancytopenia, central diabetes insipidus, stage II sacral pressure ulcer, and hypotension. The patient made gradual progress and began to work in therapy. Independence was limited by decreased activity tolerance, impaired balance, decreased strength, and impaired coordination. He was admitted to inpatient rehabilitation for conference of therapy and continued medical management.    PAIN:  Are you having pain? No  PRECAUTIONS: None  RED FLAGS: None   WEIGHT BEARING RESTRICTIONS: No  FALLS: Has patient fallen in last 6 months? No  LIVING ENVIRONMENT: Lives with: lives with their family Lives in: House/apartment Stairs: Yes: External: 3 steps; on left going up Has following equipment at home: None  PLOF: Independent with basic ADLs  PATIENT GOALS: not really   OBJECTIVE:  Note: Objective measures were completed at Evaluation unless otherwise noted.  DIAGNOSTIC FINDINGS: FINDINGS: Brain: No acute intracranial hemorrhage. No focal mass lesion. No CT evidence of acute infarction. No midline shift or mass effect. No hydrocephalus. Basilar cisterns are patent.   Vascular: No hyperdense  vessel or unexpected calcification.   Skull: Normal. Negative for fracture or focal lesion.   Sinuses/Orbits: Paranasal sinuses and mastoid air cells are clear. Orbits are clear.   Other: None.   IMPRESSION: No acute intracranial findings  COGNITION: Overall cognitive status: History of cognitive impairments - at baseline   SENSATION: WFL  COORDINATION: Decreased coordination   MUSCLE LENGTH: Hamstrings: some HS tightness BLE   POSTURE: flexed trunk  and leaned forward at the hips  LOWER EXTREMITY ROM:   WFL   LOWER EXTREMITY MMT:    MMT Right Eval Left Eval  Hip flexion 4+ 4+  Hip extension    Hip abduction 4 4  Hip adduction 5 5  Hip internal rotation    Hip external rotation    Knee flexion 5 5  Knee extension 3+ 3+  Ankle dorsiflexion    Ankle plantarflexion    Ankle inversion    Ankle eversion    (Blank rows = not tested)   TRANSFERS: Sit to stand: unable to do from chair without UE push off  Assistive device utilized: None     Stand to sit: Modified independence  Assistive device utilized: None     Chair to chair: Modified independence  Assistive device utilized: None       STAIRS: Not tested GAIT: Findings: Gait Characteristics: ataxic, decreased trunk rotation, trunk flexed, narrow BOS, poor foot clearance- Right, and poor foot clearance- Left, Distance walked: in clinic distances, and Comments: some instability noted with gait, quad weakness  FUNCTIONAL TESTS:  5 times sit to stand: unable to do from chair, from elevated mat table 15s some LOB backwards Timed up and go (TUG): 13s Berg Balance Scale: 47/56  TREATMENT DATE:  11/11/23 Recheck goals  Ankle 4 way with red band 20 reps  NuStep L5 x16mins  Shoulder ext 10# 2x10 Leg press 40# 2x10   10/17/23 NuStep L 5 x 6 min 8in step ups from airex forward & lateral  x  10 each   Heel raises black bar  HS curls 35lb 2x15 Leg Ext 10lb 2x10   10/14/23 NuStep L 5 x 6 min Sit to stand from elevated mat 2x10 HS curls 25lb 2x10 Leg Ext 10lb 2x10  Leg press 40lb 2x12 30lb resisted side steps x7 each 6in step ups x10 each  EVAL 09/30/23    PATIENT EDUCATION: Education details: POC and HEP Person educated: Patient Education method: Medical illustrator Education comprehension: verbalized understanding  HOME EXERCISE PROGRAM: Access Code: [email or web address] URL: [email or web address] Date: 09/30/2023 Prepared by: Donavon Fudge  Exercises - Sit to Stand  - 1 x daily - 7 x weekly - 2 sets - 10 reps - Single Leg Stance  - 1 x daily - 7 x weekly - 5 reps - 10 hold - Tandem Stance in Corner  - 1 x daily - 7 x weekly - 3 reps - 15 hold - Seated Knee Extension with Resistance  - 1 x daily - 7 x weekly - 2 sets - 10 reps - Seated Hip Abduction with Resistance  - 1 x daily - 7 x weekly - 2 sets - 10 reps  GOALS: Goals reviewed with patient? Yes  SHORT TERM GOALS: Target date: 10/28/23  Patient will be independent with initial HEP. Baseline: given 09/30/23 Goal status: IN PROGRESS 11/11/23  2.  Patient will demonstrate decreased fall risk by scoring < 12 sec on TUG. Baseline: 13s Goal status: MET 11/11/23  3.  Patient will demonstrate SLS >10s on firm and foam surfaces BLE Baseline: 5-10s on firm Goal status: INITIAL   LONG TERM GOALS: Target date: 11/25/23  Patient will be independent with advanced/ongoing HEP to improve outcomes and carryover.  Baseline:  Goal status: INITIAL  2.  Patient will be able to do tandem hold for 30s bilaterally.  Baseline: needs help to step and holds for 15s Goal status: INITIAL  3.  Patient will be able to complete 10 consecutive tandem steps Baseline: 3 steps before LOB Goal status: INITIAL   4.  Patient will demonstrate improved functional LE strength as demonstrated by 5xSTS from chair height or  lowest level mat <14s. Baseline: 15s from elevated mat w/LOB, unable to do from chair height  Goal status: IN PROGRESS 18.60 from slightly elevated mat and boot on LLE  5.  Patient will increase quad strength to 5/5  Baseline: 3+ BLE Goal status: MET 11/11/23   ASSESSMENT:  CLINICAL IMPRESSION: Patient is a 23 y.o. male who was seen today for physical therapy treatment for encephalitis. He returns with a CAM boot on due to hurting his ankle playing basketball. Due to this we were limited in pushing his endurance today. Unable to retest all goals, especially for balance due to ankle problem today. He was told to minimize weight bearing while it heals, he does feel that it is almost healed. Patient still struggles with STS from low height. He will benefit from PT to address his strength and balance deficits to improve his stability and mobility to decrease his risk for falls.    OBJECTIVE IMPAIRMENTS: Abnormal gait, decreased balance, decreased coordination, decreased strength, and decreased safety awareness.   ACTIVITY LIMITATIONS: locomotion level    PERSONAL  FACTORS: Age, Behavior pattern, Fitness, Past/current experiences, Time since onset of injury/illness/exacerbation, and 3+ comorbidities: ADD, encephalitis, trach, PEG are also affecting patient's functional outcome.   REHAB POTENTIAL: Good  CLINICAL DECISION MAKING: Stable/uncomplicated  EVALUATION COMPLEXITY: Low  PLAN:  PT FREQUENCY: 1-2x/week  PT DURATION: 8 weeks  PLANNED INTERVENTIONS: 97110-Therapeutic exercises, 97530- Therapeutic activity, [id number]- Neuromuscular re-education, 97535- Self Care, [id number]- Manual therapy, [phone]- Gait training, Patient/Family education, Balance training, Stair training, Cryotherapy, and Moist heat  PLAN FOR NEXT SESSION: strength and balance training   Donavon Fudge, PT 11/11/2023, 11:00 AM

## 2023-11-11 ENCOUNTER — Ambulatory Visit: Attending: Physical Medicine & Rehabilitation | Admitting: Speech Pathology

## 2023-11-11 ENCOUNTER — Encounter: Payer: Self-pay | Admitting: Speech Pathology

## 2023-11-11 ENCOUNTER — Ambulatory Visit

## 2023-11-11 DIAGNOSIS — R2689 Other abnormalities of gait and mobility: Secondary | ICD-10-CM | POA: Insufficient documentation

## 2023-11-11 DIAGNOSIS — R2681 Unsteadiness on feet: Secondary | ICD-10-CM | POA: Insufficient documentation

## 2023-11-11 DIAGNOSIS — M6281 Muscle weakness (generalized): Secondary | ICD-10-CM | POA: Insufficient documentation

## 2023-11-11 DIAGNOSIS — R278 Other lack of coordination: Secondary | ICD-10-CM

## 2023-11-11 DIAGNOSIS — R41841 Cognitive communication deficit: Secondary | ICD-10-CM | POA: Insufficient documentation

## 2023-11-11 DIAGNOSIS — R41844 Frontal lobe and executive function deficit: Secondary | ICD-10-CM | POA: Diagnosis present

## 2023-11-11 DIAGNOSIS — R41840 Attention and concentration deficit: Secondary | ICD-10-CM | POA: Diagnosis present

## 2023-11-11 NOTE — Therapy (Signed)
 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    Past Surgical History:  Procedure Laterality Date   CIRCUMCISION     GASTROSTOMY W/ FEEDING TUBE     TRACHEOSTOMY     Patient Active Problem List   Diagnosis Date Noted   Acute deep vein thrombosis (DVT) of left lower extremity (HCC) 11/10/2023   Acute hypoxic respiratory failure (HCC) 11/25/2022   Status epilepticus (HCC) 11/25/2022   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 Able to move his appt today.   Pt accompanied by (@eval ): self and family member; Nutritional therapist (brother)  PERTINENT HISTORY: Per chart review: Colton Zhang is a 23 y.o. 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   PAIN:  Are you having pain? No   FALLS: Has patient fallen in last 6 months?  No  LIVING ENVIRONMENT: Lives with: lives with their family (son) Lives in: House/apartment  PLOF:  Level of assistance: Independent with ADLs, Independent with IADLs Employment: Environmental education officer employment; [employer]house   Completed High School   PATIENT GOALS:   OBJECTIVE:  Note: Objective measures were completed at Evaluation unless otherwise noted.  DIAGNOSTIC FINDINGS: Per chart review (most recent):  CT Head Wo Contrast 07/31/23 IMPRESSION: No acute intracranial findings     Electronically Signed   By: Jackquline Boxer M.D.   On: 07/31/2023 16:16  COGNITION: Overall cognitive status: Impaired Areas of impairment:  Attention: Impaired: Alternating, Divided Memory: Impaired: Short term Prospective Awareness: Impaired: Emergent Executive function: Impaired: Organization, Planning, Error awareness, and Self-correction Functional deficits: Pt reports he feels back to baseline. Brother reports seeing some impairments at home re: forgetting information  COGNITIVE COMMUNICATION: Following directions: Follows multi-step commands inconsistently  Auditory comprehension: Impaired: suspect poor attention is contributing Verbal expression: WFL Functional communication: Impaired: Brother reports pt forgetting details of instructions/conversations  ORAL MOTOR EXAMINATION: Overall status: WFL Comments:   Pt with capped TRACH and PEG tube. Voice WNL at this time. Reports no difficulty with swallowing at this time and on a regular diet. To reassess if changes post trach removal.  STANDARDIZED ASSESSMENTS:  Cognitive Linguistic Quick Test: AGE - 18 - 69  The Cognitive Linguistic Quick Test (CLQT) was administered to assess the relative status of five cognitive domains: attention, memory, language, executive functioning, and visuospatial skills. Scores from 10 tasks were used to estimate  severity ratings (standardized for age groups 18-69 years and 70-89 years) for each domain, a clock drawing task, as well as an overall composite severity rating of cognition.       Task Score Criterion Cut Scores  Personal Facts 8/8 8  Symbol Cancellation 12/12 11  Confrontation Naming 10/10 10  Clock Drawing  10/13 12  Story Retelling 6/10 6  Symbol Trails 4/10 9  Generative Naming 4/9 5  Design Memory 6/6 5  Mazes  6/8 7  Design Generation 2/13 6    Cognitive Domain Composite Score Severity Rating  Attention 170/215 Mild  Memory 156/185 WNL  Executive Function 16/40 Moderate  Language 28/37 Mild  Visuospatial Skills 76/105 Mild  Clock Drawing  10/13 Mild  Composite Severity Rating  Mild       PATIENT REPORTED OUTCOME MEASURES (PROM): Cognitive Function: 112                                                                                                                            TREATMENT DATE:   11/11/23: Pt was seen for skilled ST services targeting cognitive-communication. Pt continues to demonstrate little awareness of cognitive challenges, though SLP suspects this is because he is not being challenged at home. SLP facilitated session by instructing pt to complete task requiring attention and executive functioning skills. Pt required mod-maxA to complete. Pt provided excuses as to why this was challenging. SLP demonstrated how pt could complete each task using an organizational strategy - breaking into steps, highlighting, and provided similarities between this task and his work. Pt to complete task for homework. To continue working on relevant tasks to encourage acknowledgement of difficulties to increase strategy use.   10/17/23: Pt was seen for skilled ST services targeting cognitive-communication. Pt forgot his folder today, but has tried to implement a better sleep routine. Pt continues to feel like he doesn't have many deficits, but when asked, he does feel like  processing is slowed. SLP completed education on attention strategies. To complete higher level executive functioning tasks to increase pt awareness of impairments.   10/08/23: Pt was seen for skilled ST services targeting cognitive-communication. SLP initiated education on attention strategies; though, pt reports he doesn't have much difficulty with attention (even with ADD dx & performance on standardized test). SLP encouraged pt to remain aware and attempt to identify what we are talking about when he is at home. Currently, pt reports poor sleep schedule. SLP suggested creating a routine to remain more consistent and decrease fatigue during the day. To cont .   10/02/23: Pt was seen for skilled ST services targeting continued assessment. Pt completed CLQT and Cognitive PROMs. SLP reviewed results with pt. No questions at this time. To begin with attention strategies next session.  PATIENT EDUCATION: Education details: Cognitive-communication and SLP role Person educated: Patient and brother Education method: Explanation Education comprehension: verbalized understanding and needs further education   GOALS: Goals reviewed with patient? Yes  SHORT TERM GOALS: Target date: 11/01/23  Complete PROMs Baseline: Goal status: INITIAL  2.  Complete CLQT and update goals Baseline:  Goal status: INITIAL  3.  Pt will verbalize 3 memory strategies to be used in recall of important information Baseline:  Goal status: INITIAL   4.  Pt will verbalize 3 strategies to increase focus on cognitive tasks Baseline:  Goal status: INITIAL  LONG TERM GOALS: Target date: 12/01/23  Improve score on PROMs Baseline:  Goal status: INITIAL  2.  Pt will report successful use of memory strategies at home Baseline:  Goal status: INITIAL   3.  Pt will report successful use of attention strategies at home Baseline:  Goal status: INITIAL  ASSESSMENT:  CLINICAL IMPRESSION: Pt is a 23 yo male who presents  to ST OP for evaluation with complex medical history, specifically autoimmune encephalitis and seizures. Pt with tracheostomy and PEG. Pt trach is capped and PEG is not being used. Voice WNL at this time. Reports no difficulty with swallowing at this time and tolerating regular diet. To reassess if changes post trach removal. Pt endorses feeling like his normal self; Brother reports he feels like he has trouble remembering details and trouble with planning and organization. Reports increased lack of motivation to complete tasks. No light or sound sensitivity. He reports he has had increase in mood swings - mostly surrounding his trach. He reports feeling re: irritable, anxious, and depressed. Affect was noted to be flat this session; though both brothers report no observable personality change. Pt was assessed using CLQT - to complete next session. SLP observed impaired awareness of deficits, impaired error awareness, difficulty following directions and slowed processing. SLP rec skilled ST services to address cognitive-communication impairment to maximize functional independence.    OBJECTIVE IMPAIRMENTS: include attention, memory, awareness, and executive functioning. These impairments are limiting patient from return to work, managing medications, managing appointments, managing finances, household responsibilities, and effectively communicating at home and in community. Factors affecting potential to achieve goals and functional outcome are awareness of impairments.. Patient will benefit from skilled SLP services to address above impairments and improve overall function.  REHAB POTENTIAL: Good  PLAN:  SLP FREQUENCY: 1-2x/week  SLP DURATION: 8 weeks  PLANNED INTERVENTIONS: Environmental controls, Cueing hierachy, Internal/external aids, Functional tasks, SLP instruction and feedback, Compensatory strategies, Patient/family education, and [id number] Treatment of speech (30 or 45 min)     [employer], CCC-SLP 11/11/2023, 11:01 AM

## 2023-11-12 ENCOUNTER — Ambulatory Visit: Admitting: Occupational Therapy

## 2023-11-12 ENCOUNTER — Encounter: Payer: Self-pay | Admitting: Occupational Therapy

## 2023-11-12 DIAGNOSIS — R278 Other lack of coordination: Secondary | ICD-10-CM

## 2023-11-12 DIAGNOSIS — R41841 Cognitive communication deficit: Secondary | ICD-10-CM | POA: Diagnosis not present

## 2023-11-12 DIAGNOSIS — R2689 Other abnormalities of gait and mobility: Secondary | ICD-10-CM

## 2023-11-12 DIAGNOSIS — R2681 Unsteadiness on feet: Secondary | ICD-10-CM

## 2023-11-12 DIAGNOSIS — M6281 Muscle weakness (generalized): Secondary | ICD-10-CM

## 2023-11-12 DIAGNOSIS — R41840 Attention and concentration deficit: Secondary | ICD-10-CM

## 2023-11-12 DIAGNOSIS — R41844 Frontal lobe and executive function deficit: Secondary | ICD-10-CM

## 2023-11-12 NOTE — Therapy (Signed)
 OUTPATIENT OCCUPATIONAL THERAPY NEURO Treatment  Patient Name: Colton Zhang MRN: [id number] DOB:27-Nov-2000, 23 y.o., male Today's Date: 11/12/2023  PCP: Velton Gibbon PA-C REFERRING PROVIDER: Dr. 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    Past Surgical History:  Procedure Laterality Date   CIRCUMCISION     GASTROSTOMY W/ FEEDING TUBE     TRACHEOSTOMY     Patient Active Problem List   Diagnosis Date Noted   Acute deep vein thrombosis (DVT) of left lower extremity (HCC) 11/10/2023   Acute hypoxic respiratory failure (HCC) 11/25/2022   Status epilepticus (HCC) 11/25/2022   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   Asthma, chronic 11/18/2022   Hypertensive urgency 11/18/2022   Headache 09/16/2013   Migraine without aura 09/16/2013   ADD (attention deficit disorder) 09/16/2013   Transient alteration of awareness 09/16/2013    ONSET DATE: 09/24/23  REFERRING DIAG:  Diagnosis  Z74.09 (ICD-10-CM) - Other reduced mobility  Z78.9 (ICD-10-CM) - Other specified health status  G04.81 (ICD-10-CM) - Other encephalitis and encephalomyelitis    THERAPY DIAG:  Muscle weakness (generalized)  Other lack of coordination  Unsteadiness on feet  Other abnormalities of gait and mobility  Frontal lobe and executive function deficit  Attention and concentration deficit  Rationale for Evaluation and  Treatment: Rehabilitation  SUBJECTIVE:   SUBJECTIVE STATEMENT: Pt reports that he spranied his ankle and that's why he has a boot  Pt accompanied by: family member  PERTINENT HISTORY: Colton Zhang is a 23 y.o. left-hand-dominant male with PMHx significant for asthma, ADD, chronic hepatitis B, MDR Pseudomonas infections, autoimmune encephalitis, and seizure who was admitted to [facility] on 08/01/2023 with increased seizure frequency. He was transferred to [facility] on 08/03/23 for management of focal status epilepticus. He required intubation with escalation of antiepileptic agents. He was started on rituximab  for autoimmune encephalitis and then given cyclophosphamide. He was taken to the OR on 08/22/2023 by Dr. Dave Erie for percutaneous tracheostomy and PEG. The patient's hospital course was complicated by MSSA ventilator associated pneumonia, acute left femoral DVT, macular rash on chest, transaminitis, pancytopenia, central diabetes insipidus, stage II sacral pressure ulcer, and hypotension.  PRECAUTIONS: Fall, no driving due to seizures, PEG, capped trach  WEIGHT BEARING RESTRICTIONS: No  PAIN: Denies pain    FALLS: Has patient fallen in last 6 months? No  LIVING ENVIRONMENT: Lives with: lives with their family   PLOF: Independent  PATIENT GOALS: improve UE strength  OBJECTIVE:  Note: Objective measures were completed at Evaluation unless otherwise noted.  HAND DOMINANCE: Left  ADLs: Overall ADLs: mod I with all basic ADLs Transfers/ambulation related to ADLs: Eating: mod I Tub Shower transfers: mod I steps into  tub   Pt was working in a warehouse prior to hospitalization   MOBILITY STATUS: Independent  POSTURE COMMENTS:  rounded shoulders and forward head   ACTIVITY TOLERANCE: Activity tolerance: decreased activity tolerance overall   UPPER EXTREMITY ROM:  LUE WFLs  Active ROM Right eval Left eval  Shoulder flexion 105   Shoulder abduction 70    Shoulder adduction    Shoulder extension    Shoulder internal rotation    Shoulder external rotation    Elbow flexion WFL   Elbow extension -15   Wrist flexion    Wrist extension    Wrist ulnar deviation    Wrist radial deviation    Wrist pronation WFL   Wrist supination WFL   (Blank rows = not tested)  UPPER EXTREMITY MMT:     MMT Right eval Left eval  Shoulder flexion 3+/5 4-/5  Shoulder abduction    Shoulder adduction    Shoulder extension    Shoulder internal rotation    Shoulder external rotation    Middle trapezius    Lower trapezius    Elbow flexion    Elbow extension    Wrist flexion    Wrist extension    Wrist ulnar deviation    Wrist radial deviation    Wrist pronation    Wrist supination    (Blank rows = not tested)  HAND FUNCTION: Grip strength: Right: 72 lbs; Left: 90 lbs  COORDINATION: 9 Hole Peg test: Right: 21.16 sec; Left: 26.08, 25.53 with drops sec  SENSATION: WFL   COGNITION: Overall cognitive status: Impaired, pt's brother reports delayed processing speed. Pt with decreased awareness of higher level cognitve deficits.  Pt recalled 3/3/ words following a dealy. He completed Trail making B correctly, however pt previously perfromed alternating attention test with ST in prior session and did not complete. OT will further assess alternating attention in a functional context. Pt was unable to spell WORLD backwards correctly, he transposed o and r.  VISION ASSESSMENT: Not tested- pt denies visual changes hx of diplopia with onset of encephalitis, pt reports this has resolved Bells Test 34/35 items located   OBSERVATIONS: Pt with flat affect accompanied by his brother                                                                                                                             TREATMENT DATE: 11/12/23-  Cane exercises in sitting for shoulder flex and abduction min v.c for RUE shoulder/ scapular positioning followed by red  theraband ex for shoulder flex, ext, rows biceps curls, triceps extension with min v.c.  Perfromed  low-range shoulder abduction with red band but discontinued due to compensation.  UBE x 6 mins level 5 for conditioning checked 9 hole peg test and pt met his goal.  10/17/23: Cane exercises in sitting for shoulder flex and abduction for stretch followed by red theraband ex for shoulder flex, ext, rows with min v.c.  Attempted low-range  shoulder abduction with red band but d/c due to compensation.    Placing small pegs in pegboard to copy design for incr coordination with L hand and cognition with good accuracy and incr time.  Removing pegs by manipulating/translating 3 in hand with min difficulty.  Constant Therapy Alternating Symbol Match Level 4 for Alternating attention with 92% accuracy and 67.45 sec response time.  Copying PVC design for problem solving and attention for functional tasks.  Pt completed design with mod cueing initially (to sort pieces first and to use information sheet to problem solve which piece was correct).  Then pt was able to complete mod complex design with 1 cue for 2 omitted pieces at the end and overall incr time.  10/14/23- alternating attention task on constant therapy:Altenating words level 1, 91% accuracy, response time was significantly increased to 153.67 secs Upgraded theraband exercises for rowing and shoulder extension to red band bilateral UE's 15 reps each min v.c Therapsit checked several goals see below. Ambulating while tossing a ball and naming animals for each letter of the alphabet, min-mod v.c and several LOB. Increased processing time required.  10/07/23- Reviewed HEP issued last visit, 10 reps each exercise min v.c for positioning Checked 9 hole peg test left per pt request. Gripper set at level 4 resistance to address sustained grip for picking up 1 inch blocks, min-mod difficulty, drops. Alternating attention task contstant therapy alternating  symbols level 4, 94% accuracy    PATIENT EDUCATION: Education details:  Reviewed red theraband HEP  - see pt instructions Person educated: Patient and   Education method: Explanation, demonstration, v.c Education comprehension: verbalized understanding, returned demonstration, needs reinforcement  HOME EXERCISE PROGRAM:  10/02/23- inital HEP, cane T-band  10/14/23- red t-band for shoulder extension and rows Theraband x15 each 10/17/23  added shoulder flex with red theraband  GOALS: Goals reviewed with patient? Yes  SHORT TERM GOALS: Target date: 10/31/23  I with inital HEP Goal status: met, 10/08/23  2.  Pt will demonstrate ability to retreive a lightweight object at 110* shoulder flexion with RUE Goal status: met, 115* 10/14/23  3.  Pt will increase RUE grip strength by by 10 lbs for increased RUE functional use. Goal status: met, 85 lbs, 90 lbs 10/14/23  4.  Pt will perform an alternating attention task with 90% or better accuracy in rep for work activities.  Goal status: ongoing, 91% today, level 1 for alternating words, increased processing time required. 10/14/23, 11/12/23- ongoing  5.  Pt will improve LUE 9 hole peg test score by 3 secs without drops for increased ease with ADLs.  Goal status: ongoing, 24.36, 1 drop, 19.98 10/08/23, met 22.57 met    LONG TERM GOALS: Target date: 12/23/23  I with updated HEP Goal status: INITIAL  2.  Pt will demonstrate ability to retreive a  5 lbs object at 115* shoulder flexion with RUE demonstrating good control. Goal status: INITIAL  3.  Pt will demonstrate ability to perfom a physical and cognitve task simultaneously with 90% or better accuracy in prep for eventual driving and return to work. Goal status: ongoing, min-mod v.c 10/14/23  4.  Pt will perfrom simulated work activities mod I demonstrating good safety and positioning. Goal status: INITIAL  5.  Pt will perform prior level of home management and cooking mod I Goal status:  INITIAL    ASSESSMENT:  CLINICAL IMPRESSION:  Pt is progressing towards goals. He demonstrates improving UE strength and fine motor coordination. He met STG#5.  PERFORMANCE DEFICITS: in functional skills including ADLs, IADLs, coordination, dexterity, ROM, strength, flexibility, Fine motor control, Gross motor control, mobility, balance, endurance, decreased knowledge of precautions, decreased knowledge of use of DME, vision, and UE functional use, cognitive skills including attention, energy/drive, problem solving, safety awareness, sequencing, temperament/personality, thought, and understand, and psychosocial skills including coping strategies, environmental adaptation, habits, interpersonal interactions, and routines and behaviors.   IMPAIRMENTS: are limiting patient from ADLs, IADLs, work, play, leisure, and social participation.   CO-MORBIDITIES: may have co-morbidities  that affects occupational performance. Patient will benefit from skilled OT to address above impairments and improve overall function.  MODIFICATION OR ASSISTANCE TO COMPLETE EVALUATION: No modification of tasks or assist necessary to complete an evaluation.  OT OCCUPATIONAL PROFILE AND HISTORY: Detailed assessment: Review of records and additional review of physical, cognitive, psychosocial history related to current functional performance.  CLINICAL DECISION MAKING: LOW - limited treatment options, no task modification necessary  REHAB POTENTIAL: Good  EVALUATION COMPLEXITY: Low    PLAN:  OT FREQUENCY: 12 visits   OT DURATION: 12 weeks ( to account for scheduling)  PLANNED INTERVENTIONS: 97168 OT Re-evaluation, 97535 self care/ADL training, 09811 therapeutic exercise, 97530 therapeutic activity, 97112 neuromuscular re-education, 97140 manual therapy, 97116 gait training, 91478 aquatic therapy, 97035 ultrasound, 97018 paraffin, 29562 fluidotherapy, 97010 moist heat, 97010 cryotherapy, 97129 Cognitive  training (first 15 min), 13086 Cognitive training(each additional 15 min), passive range of motion, balance training, functional mobility training, visual/perceptual remediation/compensation, energy conservation, coping strategies training, patient/family education, and DME and/or AE instructions  RECOMMENDED OTHER SERVICES: n/a  CONSULTED AND AGREED WITH PLAN OF CARE: Patient and family member/caregiver  PLAN FOR NEXT SESSION:    work towards long term goals   Jalaysha Skilton, OTR/L 11/12/2023, 1:36 PM

## 2023-11-12 NOTE — Patient Instructions (Signed)
  Strengthening: Resisted Flexion   Hold tubing with ___one__ arm(s) at side. Pull forward and up. Move shoulder through pain-free range of motion. Repeat __10__ times per set.  Do _1-2_ sessions per day , every other day   Strengthening: Resisted Extension   Hold tubing in __one___ hand(s), arm forward. Pull arm back, elbow straight. Repeat _10___ times per set. Do _1-2___ sessions per day, every other day.      Elbow Flexion: Resisted   With tubing held in __one____ hand(s) and other end secured under foot, curl arm up as far as possible. Repeat _10___ times per set. Do _1-2___ sessions per day, every other day.    Elbow Extension: Resisted   Sit in chair with resistive band holding with other hand) and ____one___ elbow bent. Straighten elbow. Repeat _10___ times per set.  Do _1-2___ sessions per day, every other day.  (Home) Retraction: Row - Bilateral (Anchor)    Facing anchor, arms reaching forward, pull hands toward stomach, pinching shoulder blades together. Repeat _10___ times per set. Do _1___ sets per session. Do __5__ sessions per week.   Copyright  VHI. All rights reserved.   Copyright  VHI. All rights reserved.

## 2023-11-20 NOTE — Therapy (Signed)
 OUTPATIENT OCCUPATIONAL THERAPY NEURO Treatment  Patient Name: Colton Zhang MRN: [id number] DOB:Nov 15, 2000, 23 y.o., male Today's Date: 11/21/2023  PCP: Verneita Tari Kiang PA-C REFERRING PROVIDER: Dr. 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    Past Surgical History:  Procedure Laterality Date   CIRCUMCISION     GASTROSTOMY W/ FEEDING TUBE     TRACHEOSTOMY     Patient Active Problem List   Diagnosis Date Noted   Acute deep vein thrombosis (DVT) of left lower extremity (HCC) 11/10/2023   Acute hypoxic respiratory failure (HCC) 11/25/2022   Status epilepticus (HCC) 11/25/2022   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   Asthma, chronic 11/18/2022   Hypertensive urgency 11/18/2022   Headache 09/16/2013   Migraine without aura 09/16/2013   ADD (attention deficit disorder) 09/16/2013   Transient alteration of awareness 09/16/2013    ONSET DATE: 09/24/23  REFERRING DIAG:  Diagnosis  Z74.09 (ICD-10-CM) - Other reduced mobility  Z78.9 (ICD-10-CM) - Other specified health status  G04.81 (ICD-10-CM) - Other encephalitis and encephalomyelitis    THERAPY DIAG:  Muscle weakness (generalized)  Other lack of coordination  Unsteadiness on feet  Frontal lobe and executive function deficit  Attention and concentration deficit  Rationale for Evaluation and Treatment: Rehabilitation  SUBJECTIVE:    SUBJECTIVE STATEMENT: Pt reports that he partially tore a ligament playing basketball and that's why he has a boot, but unable to state how long he has to wear boot or precautions.  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    Pt accompanied by: family member  PERTINENT HISTORY: Colton Zhang is a 23 y.o. left-hand-dominant male with PMHx significant for asthma, ADD, chronic hepatitis B, MDR Pseudomonas infections, autoimmune encephalitis, and seizure who was admitted to [facility] on 08/01/2023 with increased seizure frequency. He was transferred to [facility] on 08/03/23 for management of focal status epilepticus. He required intubation with escalation of antiepileptic agents. He was started on rituximab  for autoimmune encephalitis and then given cyclophosphamide. He was taken to the OR on 08/22/2023 by Dr. Valery for percutaneous tracheostomy and PEG. The patient's hospital course was complicated by MSSA ventilator associated pneumonia, acute left femoral DVT, macular rash on chest, transaminitis, pancytopenia, central diabetes insipidus, stage II sacral pressure ulcer, and hypotension.  PRECAUTIONS: Fall, no driving due to seizures, PEG, capped trach  WEIGHT BEARING RESTRICTIONS: No  PAIN: Denies pain    FALLS: Has patient fallen in last 6 months? No  LIVING ENVIRONMENT: Lives with: lives with their family   PLOF: Independent  PATIENT GOALS: improve UE strength  OBJECTIVE:  Note:  Objective measures were completed at Evaluation unless otherwise noted.  HAND DOMINANCE: Left  ADLs: Overall ADLs: mod I with all basic ADLs Transfers/ambulation related to ADLs: Eating: mod I Tub Shower transfers: mod I steps into tub   Pt was working in a warehouse prior to hospitalization   MOBILITY STATUS: Independent  POSTURE COMMENTS:  rounded shoulders and  forward head   ACTIVITY TOLERANCE: Activity tolerance: decreased activity tolerance overall   UPPER EXTREMITY ROM:  LUE WFLs  Active ROM Right eval Left eval  Shoulder flexion 105   Shoulder abduction 70   Shoulder adduction    Shoulder extension    Shoulder internal rotation    Shoulder external rotation    Elbow flexion WFL   Elbow extension -15   Wrist flexion    Wrist extension    Wrist ulnar deviation    Wrist radial deviation    Wrist pronation WFL   Wrist supination WFL   (Blank rows = not tested)  UPPER EXTREMITY MMT:     MMT Right eval Left eval  Shoulder flexion 3+/5 4-/5  Shoulder abduction    Shoulder adduction    Shoulder extension    Shoulder internal rotation    Shoulder external rotation    Middle trapezius    Lower trapezius    Elbow flexion    Elbow extension    Wrist flexion    Wrist extension    Wrist ulnar deviation    Wrist radial deviation    Wrist pronation    Wrist supination    (Blank rows = not tested)  HAND FUNCTION: Grip strength: Right: 72 lbs; Left: 90 lbs  COORDINATION: 9 Hole Peg test: Right: 21.16 sec; Left: 26.08, 25.53 with drops sec  SENSATION: WFL   COGNITION: Overall cognitive status: Impaired, pt's brother reports delayed processing speed. Pt with decreased awareness of higher level cognitve deficits.  Pt recalled 3/3/ words following a dealy. He completed Trail making B correctly, however pt previously perfromed alternating attention test with ST in prior session and did not complete. OT will further assess alternating attention in a functional context. Pt was unable to spell WORLD backwards correctly, he transposed o and r.  VISION ASSESSMENT: Not tested- pt denies visual changes hx of diplopia with onset of encephalitis, pt reports this has resolved Bells Test 34/35 items located   OBSERVATIONS: Pt with flat affect accompanied by his brother                                                                                                                              TREATMENT DATE:   11/21/23:   Constant Therapy:  Alternating words for alternating attention (alphabetizing) with max difficulty despite cueing so discontinued.  Alternating symbols for alternating attention with 88% accuracy and 73.79 sec average response time.   Cane ex for shoulder flex and abduction with min v.c.  Followed by red theraband ex for shoulder flex, ext, rows, shoulder  bilateral ER, bicep curls, triceps ext with min-mod v.c. for proper positioning of R shoulder.    Wt. Bearing through BUE with hands on mat, shoulders in ER with bridging off mat with min cueing for positioning/min facilitation.  Then wt. Bearing through R hand in abduction on mat with body on arm movements/lateral wt. Shift with min cueing/facilitation for scapular stability.     11/12/23-  Cane exercises in sitting for shoulder flex and abduction min v.c for RUE shoulder/ scapular positioning followed by red theraband ex for shoulder flex, ext, rows biceps curls, triceps extension with min v.c.  Perfromed  low-range shoulder abduction with red band but discontinued due to compensation.  UBE x 6 mins level 5 for conditioning checked 9 hole peg test and pt met his goal.  10/17/23: Cane exercises in sitting for shoulder flex and abduction for stretch followed by red theraband ex for shoulder flex, ext, rows with min v.c.  Attempted low-range shoulder abduction with red band but d/c due to compensation.    Placing small pegs in pegboard to copy design for incr coordination with L hand and cognition with good accuracy and incr time.  Removing pegs by manipulating/translating 3 in hand with min difficulty.  Constant Therapy Alternating Symbol Match Level 4 for Alternating attention with 92% accuracy and 67.45 sec response time.  Copying PVC design for problem solving and attention for functional tasks.  Pt completed design with mod cueing initially (to sort  pieces first and to use information sheet to problem solve which piece was correct).  Then pt was able to complete mod complex design with 1 cue for 2 omitted pieces at the end and overall incr time.  10/14/23- alternating attention task on constant therapy:  Altenating words level 1, 91% accuracy, response time was significantly increased to 153.67 secs Upgraded theraband exercises for rowing and shoulder extension to red band bilateral UE's 15 reps each min v.c Therapsit checked several goals see below. Ambulating while tossing a ball and naming animals for each letter of the alphabet, min-mod v.c and several LOB. Increased processing time required.  10/07/23- Reviewed HEP issued last visit, 10 reps each exercise min v.c for positioning Checked 9 hole peg test left per pt request. Gripper set at level 4 resistance to address sustained grip for picking up 1 inch blocks, min-mod difficulty, drops. Alternating attention task contstant therapy alternating symbols level 4, 94% accuracy    PATIENT EDUCATION: Education details:  see above for cueing for proper positioning Person educated: Patient Education method: Explanation, demonstration, v.c Education comprehension: verbalized understanding, returned demonstration, needs reinforcement  HOME EXERCISE PROGRAM:  10/02/23- inital HEP, cane T-band  10/14/23- red t-band for shoulder extension and rows Theraband x15 each 10/17/23  added shoulder flex with red theraband  GOALS: Goals reviewed with patient? Yes  SHORT TERM GOALS: Target date: 10/31/23  I with inital HEP Goal status: met, 10/08/23  2.  Pt will demonstrate ability to retreive a lightweight object at 110* shoulder flexion with RUE Goal status: met, 115* 10/14/23  3.  Pt will increase RUE grip strength by by 10 lbs for increased RUE functional use. Goal status: met, 85 lbs, 90 lbs 10/14/23  4.  Pt will perform an alternating attention task with 90% or better accuracy in rep for work  activities. Goal status: ongoing, 91% today, level 1 for alternating words, increased processing time required. 10/14/23, 11/12/23- ongoing, 11/21/23 ongoing with continued difficulty  5.  Pt will improve LUE 9 hole peg  test score by 3 secs without drops for increased ease with ADLs.  Goal status: ongoing, 24.36, 1 drop, 19.98 10/08/23, met 22.57 met    LONG TERM GOALS: Target date: 12/23/23  I with updated HEP Goal status: Ongoing, pt needs cueing for proper positioning/R shoulder compensation 11/21/23  2.  Pt will demonstrate ability to retreive a 5 lbs object at 115* shoulder flexion with RUE demonstrating good control. Goal status: ongoing, 11/21/23--pt demo decr scapular stability with IR/elevation of R shoulder  3.  Pt will demonstrate ability to perfom a physical and cognitve task simultaneously with 90% or better accuracy in prep for eventual driving and return to work. Goal status: ongoing, min-mod v.c 10/14/23  4.  Pt will perfrom simulated work activities mod I demonstrating good safety and positioning. Goal status: ongoing 11/21/23  5.  Pt will perform prior level of home management and cooking mod I Goal status: INITIAL    ASSESSMENT:  CLINICAL IMPRESSION:   Pt demonstrates improving UE strength, but demo compensation at R shoulder with decr awareness of this.  He continues to demo difficulty with cognitive tasks was easily self-distracted today.   PERFORMANCE DEFICITS: in functional skills including ADLs, IADLs, coordination, dexterity, ROM, strength, flexibility, Fine motor control, Gross motor control, mobility, balance, endurance, decreased knowledge of precautions, decreased knowledge of use of DME, vision, and UE functional use, cognitive skills including attention, energy/drive, problem solving, safety awareness, sequencing, temperament/personality, thought, and understand, and psychosocial skills including coping strategies, environmental adaptation, habits,  interpersonal interactions, and routines and behaviors.   IMPAIRMENTS: are limiting patient from ADLs, IADLs, work, play, leisure, and social participation.   CO-MORBIDITIES: may have co-morbidities  that affects occupational performance. Patient will benefit from skilled OT to address above impairments and improve overall function.  MODIFICATION OR ASSISTANCE TO COMPLETE EVALUATION: No modification of tasks or assist necessary to complete an evaluation.  OT OCCUPATIONAL PROFILE AND HISTORY: Detailed assessment: Review of records and additional review of physical, cognitive, psychosocial history related to current functional performance.  CLINICAL DECISION MAKING: LOW - limited treatment options, no task modification necessary  REHAB POTENTIAL: Good  EVALUATION COMPLEXITY: Low    PLAN:  OT FREQUENCY: 12 visits   OT DURATION: 12 weeks ( to account for scheduling)  PLANNED INTERVENTIONS: 97168 OT Re-evaluation, 97535 self care/ADL training, 02889 therapeutic exercise, 97530 therapeutic activity, 97112 neuromuscular re-education, 97140 manual therapy, 97116 gait training, 02886 aquatic therapy, 97035 ultrasound, 97018 paraffin, 02960 fluidotherapy, 97010 moist heat, 97010 cryotherapy, 97129 Cognitive training (first 15 min), 02869 Cognitive training(each additional 15 min), passive range of motion, balance training, functional mobility training, visual/perceptual remediation/compensation, energy conservation, coping strategies training, patient/family education, and DME and/or AE instructions  RECOMMENDED OTHER SERVICES: n/a  CONSULTED AND AGREED WITH PLAN OF CARE: Patient and family member/caregiver  PLAN FOR NEXT SESSION:    continue to work towards long term goals, alternating attention, awareness and proper positioning of R shoulder    Matthew Pais, OTR/L 11/21/2023, 10:01 AM

## 2023-11-21 ENCOUNTER — Encounter: Payer: Self-pay | Admitting: Speech Pathology

## 2023-11-21 ENCOUNTER — Ambulatory Visit: Admitting: Occupational Therapy

## 2023-11-21 ENCOUNTER — Encounter: Payer: Self-pay | Admitting: Occupational Therapy

## 2023-11-21 ENCOUNTER — Ambulatory Visit: Admitting: Speech Pathology

## 2023-11-21 DIAGNOSIS — R41844 Frontal lobe and executive function deficit: Secondary | ICD-10-CM

## 2023-11-21 DIAGNOSIS — R41840 Attention and concentration deficit: Secondary | ICD-10-CM

## 2023-11-21 DIAGNOSIS — R41841 Cognitive communication deficit: Secondary | ICD-10-CM

## 2023-11-21 DIAGNOSIS — R278 Other lack of coordination: Secondary | ICD-10-CM

## 2023-11-21 DIAGNOSIS — M6281 Muscle weakness (generalized): Secondary | ICD-10-CM

## 2023-11-21 DIAGNOSIS — R2681 Unsteadiness on feet: Secondary | ICD-10-CM

## 2023-11-21 NOTE — Therapy (Signed)
 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    Past Surgical History:  Procedure Laterality Date   CIRCUMCISION     GASTROSTOMY W/ FEEDING TUBE     TRACHEOSTOMY     Patient Active Problem List   Diagnosis Date Noted   Acute deep vein thrombosis (DVT) of left lower extremity (HCC) 11/10/2023   Acute hypoxic respiratory failure (HCC) 11/25/2022   Status epilepticus (HCC) 11/25/2022   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   Pt accompanied by (@eval ): self and family member; Nutritional therapist (brother)  PERTINENT HISTORY: Per chart review: Colton Zhang is a 23 y.o. 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   PAIN:  Are you having pain? No    FALLS: Has patient fallen in last 6 months?  No  LIVING ENVIRONMENT: Lives with: lives with their family (son) Lives in: House/apartment  PLOF:  Level of assistance: Independent with ADLs, Independent with IADLs Employment: Environmental education officer employment; Warehouse   Completed High School   PATIENT GOALS:   OBJECTIVE:  Note: Objective measures were completed at Evaluation unless otherwise noted.  DIAGNOSTIC FINDINGS: Per chart review (most recent):  CT Head Wo Contrast 07/31/23 IMPRESSION: No acute intracranial findings     Electronically Signed   By: Jackquline Boxer M.D.   On: 07/31/2023 16:16  COGNITION: Overall cognitive status: Impaired Areas of impairment:  Attention: Impaired: Alternating, Divided Memory: Impaired: Short term Prospective Awareness: Impaired: Emergent Executive function: Impaired: Organization, Planning, Error awareness, and Self-correction Functional deficits: Pt reports he feels back to baseline. Brother reports seeing some impairments at home re: forgetting information  COGNITIVE COMMUNICATION: Following directions: Follows multi-step commands inconsistently  Auditory comprehension: Impaired: suspect poor attention is contributing Verbal expression: WFL Functional communication: Impaired: Brother reports pt forgetting details of instructions/conversations  ORAL MOTOR EXAMINATION: Overall status: WFL Comments:   Pt with capped TRACH and PEG tube. Voice WNL at this time. Reports no difficulty with swallowing at this time and on a regular diet. To reassess if changes post trach removal.  STANDARDIZED ASSESSMENTS:  Cognitive Linguistic Quick Test: AGE - 18 - 69   The Cognitive Linguistic Quick Test (CLQT) was administered to assess the relative status  of five cognitive domains: attention, memory, language, executive functioning, and visuospatial skills. Scores from 10 tasks were used to estimate severity ratings (standardized for age groups 18-69  years and 70-89 years) for each domain, a clock drawing task, as well as an overall composite severity rating of cognition.       Task Score Criterion Cut Scores  Personal Facts 8/8 8  Symbol Cancellation 12/12 11  Confrontation Naming 10/10 10  Clock Drawing  10/13 12  Story Retelling 6/10 6  Symbol Trails 4/10 9  Generative Naming 4/9 5  Design Memory 6/6 5  Mazes  6/8 7  Design Generation 2/13 6    Cognitive Domain Composite Score Severity Rating  Attention 170/215 Mild  Memory 156/185 WNL  Executive Function 16/40 Moderate  Language 28/37 Mild  Visuospatial Skills 76/105 Mild  Clock Drawing  10/13 Mild  Composite Severity Rating  Mild       PATIENT REPORTED OUTCOME MEASURES (PROM): Cognitive Function: 112                                                                                                                            TREATMENT DATE:   11/21/23: Pt was seen for skilled ST services targeting cognitive-communication. Pt was late to today's session. He did return completed HEP task with 2 errors re: counting states on a data sheet. Pt is not carrying over strategy use at home and does not appear to be invested in learning cognitive strategies. SLP facilitated today's session using real life cognitive task re: identifying which bank checking account option would be best. Pt required modA to complete. Pt continues to deflect discussions of deficits with humor - which tends to mask his impairments. Pt continues to struggle with mildly complex tasks. SLP to discuss discharge next session if pt is not feeling motivated to complete therapy at this time.   11/11/23: Pt was seen for skilled ST services targeting cognitive-communication. Pt continues to demonstrate little awareness of cognitive challenges, though SLP suspects this is because he is not being challenged at home. SLP facilitated session by instructing pt to complete task requiring attention and executive functioning  skills. Pt required mod-maxA to complete. Pt provided excuses as to why this was challenging. SLP demonstrated how pt could complete each task using an organizational strategy - breaking into steps, highlighting, and provided similarities between this task and his work. Pt to complete task for homework. To continue working on relevant tasks to encourage acknowledgement of difficulties to increase strategy use.   10/17/23: Pt was seen for skilled ST services targeting cognitive-communication. Pt forgot his folder today, but has tried to implement a better sleep routine. Pt continues to feel like he doesn't have many deficits, but when asked, he does feel like processing is slowed. SLP completed education on attention strategies. To complete higher level executive functioning tasks to increase pt awareness of impairments.   10/08/23: Pt was seen for skilled ST services targeting  cognitive-communication. SLP initiated education on attention strategies; though, pt reports he doesn't have much difficulty with attention (even with ADD dx & performance on standardized test). SLP encouraged pt to remain aware and attempt to identify what we are talking about when he is at home. Currently, pt reports poor sleep schedule. SLP suggested creating a routine to remain more consistent and decrease fatigue during the Colton. To cont .   10/02/23: Pt was seen for skilled ST services targeting continued assessment. Pt completed CLQT and Cognitive PROMs. SLP reviewed results with pt. No questions at this time. To begin with attention strategies next session.    PATIENT EDUCATION: Education details: Cognitive-communication and SLP role Person educated: Patient and brother Education method: Explanation Education comprehension: verbalized understanding and needs further education   GOALS: Goals reviewed with patient? Yes  SHORT TERM GOALS: Target date: 11/01/23  Complete PROMs Baseline: Goal status: INITIAL  2.  Complete  CLQT and update goals Baseline:  Goal status: INITIAL  3.  Pt will verbalize 3 memory strategies to be used in recall of important information Baseline:  Goal status: INITIAL   4.  Pt will verbalize 3 strategies to increase focus on cognitive tasks Baseline:  Goal status: INITIAL  LONG TERM GOALS: Target date: 12/01/23  Improve score on PROMs Baseline:  Goal status: INITIAL  2.  Pt will report successful use of memory strategies at home Baseline:  Goal status: INITIAL   3.  Pt will report successful use of attention strategies at home Baseline:  Goal status: INITIAL  ASSESSMENT:  CLINICAL IMPRESSION: Pt is a 23 yo male who presents to ST OP for evaluation with complex medical history, specifically autoimmune encephalitis and seizures. Pt with tracheostomy and PEG. Pt trach is capped and PEG is not being used. Voice WNL at this time. Reports no difficulty with swallowing at this time and tolerating regular diet. To reassess if changes post trach removal. Pt endorses feeling like his normal self; Brother reports he feels like he has trouble remembering details and trouble with planning and organization. Reports increased lack of motivation to complete tasks. No light or sound sensitivity. He reports he has had increase in mood swings - mostly surrounding his trach. He reports feeling re: irritable, anxious, and depressed. Affect was noted to be flat this session; though both brothers report no observable personality change. Pt was assessed using CLQT - to complete next session. SLP observed impaired awareness of deficits, impaired error awareness, difficulty following directions and slowed processing. SLP rec skilled ST services to address cognitive-communication impairment to maximize functional independence.    OBJECTIVE IMPAIRMENTS: include attention, memory, awareness, and executive functioning. These impairments are limiting patient from return to work, managing medications,  managing appointments, managing finances, household responsibilities, and effectively communicating at home and in community. Factors affecting potential to achieve goals and functional outcome are awareness of impairments.. Patient will benefit from skilled SLP services to address above impairments and improve overall function.  REHAB POTENTIAL: Good  PLAN:  SLP FREQUENCY: 1-2x/week  SLP DURATION: 8 weeks  PLANNED INTERVENTIONS: Environmental controls, Cueing hierachy, Internal/external aids, Functional tasks, SLP instruction and feedback, Compensatory strategies, Patient/family education, and [id number] Treatment of speech (30 or 45 min)     [employer], CCC-SLP 11/21/2023, 8:27 AM

## 2023-11-24 ENCOUNTER — Encounter: Payer: Self-pay | Admitting: Occupational Therapy

## 2023-11-25 ENCOUNTER — Ambulatory Visit: Admitting: Speech Pathology

## 2023-11-25 ENCOUNTER — Encounter: Payer: Self-pay | Admitting: Occupational Therapy

## 2023-11-25 ENCOUNTER — Encounter: Payer: Self-pay | Admitting: Speech Pathology

## 2023-11-25 ENCOUNTER — Ambulatory Visit: Attending: Physical Medicine & Rehabilitation | Admitting: Physical Therapy

## 2023-11-25 ENCOUNTER — Encounter: Payer: Self-pay | Admitting: Physical Therapy

## 2023-11-25 ENCOUNTER — Ambulatory Visit: Admitting: Occupational Therapy

## 2023-11-25 DIAGNOSIS — R41840 Attention and concentration deficit: Secondary | ICD-10-CM | POA: Diagnosis present

## 2023-11-25 DIAGNOSIS — R41844 Frontal lobe and executive function deficit: Secondary | ICD-10-CM

## 2023-11-25 DIAGNOSIS — R41841 Cognitive communication deficit: Secondary | ICD-10-CM | POA: Diagnosis present

## 2023-11-25 DIAGNOSIS — R2681 Unsteadiness on feet: Secondary | ICD-10-CM | POA: Diagnosis present

## 2023-11-25 DIAGNOSIS — R2689 Other abnormalities of gait and mobility: Secondary | ICD-10-CM | POA: Insufficient documentation

## 2023-11-25 DIAGNOSIS — M6281 Muscle weakness (generalized): Secondary | ICD-10-CM | POA: Insufficient documentation

## 2023-11-25 DIAGNOSIS — R278 Other lack of coordination: Secondary | ICD-10-CM | POA: Diagnosis present

## 2023-11-25 NOTE — Therapy (Signed)
 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    Past Surgical History:  Procedure Laterality Date   CIRCUMCISION     GASTROSTOMY W/ FEEDING TUBE     TRACHEOSTOMY     Patient Active Problem List   Diagnosis Date Noted   Acute deep vein thrombosis (DVT) of left lower extremity (HCC) 11/10/2023   Acute hypoxic respiratory failure (HCC) 11/25/2022   Status epilepticus (HCC) 11/25/2022   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   Asthma, chronic 11/18/2022   Hypertensive urgency 11/18/2022   Headache 09/16/2013   Migraine without aura 09/16/2013   ADD (attention deficit disorder) 09/16/2013   Transient alteration of awareness 09/16/2013    ONSET DATE: 09/16/23  REFERRING DIAG:  Z74.09 (ICD-10-CM) - Other reduced mobility  Z78.9 (ICD-10-CM) - Other specified health status  R56.9 (ICD-10-CM) - Unspecified convulsions    THERAPY DIAG:  Muscle weakness (generalized)  Unsteadiness on feet  Frontal lobe and executive function deficit  Rationale for Evaluation and Treatment: Rehabilitation  SUBJECTIVE:                                                                                                                                                                                             SUBJECTIVE STATEMENT: Hurt my ankle playing basketball. Partially tore a ligament. I have been in this boot for 2  weeks.   Pt accompanied by: family member brother   PERTINENT HISTORY: Colton Zhang is a 23 y.o. left-hand-dominant male with PMHx significant for asthma, ADD, chronic hepatitis B, MDR Pseudomonas infections, autoimmune encephalitis, and seizure who was admitted to [facility] on 08/01/2023 with increased seizure frequency. He was transferred to [facility] on 08/03/23 for management of focal status epilepticus. He required intubation with escalation of antiepileptic agents. He was started on rituximab  for autoimmune encephalitis and then given cyclophosphamide. He was taken to the OR on 08/22/2023 by Dr. Valery  for percutaneous tracheostomy and PEG. The patient's hospital course was complicated by MSSA ventilator associated pneumonia, acute left femoral DVT, macular rash on chest, transaminitis, pancytopenia, central diabetes insipidus, stage II sacral pressure ulcer, and hypotension. The patient made gradual progress and began to work in therapy. Independence was limited by decreased activity tolerance, impaired balance, decreased strength, and impaired coordination. He was admitted to inpatient rehabilitation for conference of therapy and continued medical management.    PAIN:  Are you having pain? No  PRECAUTIONS: None  RED FLAGS: None   WEIGHT BEARING RESTRICTIONS: No  FALLS: Has patient fallen in last 6 months? No  LIVING ENVIRONMENT: Lives with: lives with their family Lives in: House/apartment Stairs: Yes: External: 3 steps; on left going up Has following equipment at home: None  PLOF: Independent with basic ADLs  PATIENT GOALS: not really   OBJECTIVE:  Note: Objective measures were completed at Evaluation unless otherwise noted.  DIAGNOSTIC FINDINGS: FINDINGS: Brain: No acute intracranial hemorrhage. No focal mass lesion. No CT evidence of acute infarction. No midline shift or mass effect. No hydrocephalus. Basilar cisterns are patent.   Vascular: No hyperdense  vessel or unexpected calcification.   Skull: Normal. Negative for fracture or focal lesion.   Sinuses/Orbits: Paranasal sinuses and mastoid air cells are clear. Orbits are clear.   Other: None.   IMPRESSION: No acute intracranial findings  COGNITION: Overall cognitive status: History of cognitive impairments - at baseline   SENSATION: WFL  COORDINATION: Decreased coordination   MUSCLE LENGTH: Hamstrings: some HS tightness BLE   POSTURE: flexed trunk  and leaned forward at the hips  LOWER EXTREMITY ROM:   WFL   LOWER EXTREMITY MMT:    MMT Right Eval Left Eval  Hip flexion 4+ 4+  Hip extension    Hip abduction 4 4  Hip adduction 5 5  Hip internal rotation    Hip external rotation    Knee flexion 5 5  Knee extension 3+ 3+  Ankle dorsiflexion    Ankle plantarflexion    Ankle inversion    Ankle eversion    (Blank rows = not tested)   TRANSFERS: Sit to stand: unable to do from chair without UE push off  Assistive device utilized: None     Stand to sit: Modified independence  Assistive device utilized: None     Chair to chair: Modified independence  Assistive device utilized: None       STAIRS: Not tested GAIT: Findings: Gait Characteristics: ataxic, decreased trunk rotation, trunk flexed, narrow BOS, poor foot clearance- Right, and poor foot clearance- Left, Distance walked: in clinic distances, and Comments: some instability noted with gait, quad weakness  FUNCTIONAL TESTS:  5 times sit to stand: unable to do from chair, from elevated mat table 15s some LOB backwards Timed up and go (TUG): 13s Berg Balance Scale: 47/56  TREATMENT DATE:  11/11/23 Recheck goals  Ankle 4 way with red band 20 reps  NuStep L5 x22mins  Shoulder ext 10# 2x10 Leg press 40# 2x10   10/17/23 NuStep L 5 x 6 min 8in step ups from airex forward & lateral  x  10 each   Heel raises black bar  HS curls 35lb 2x15 Leg Ext 10lb 2x10   10/14/23 NuStep L 5 x 6 min Sit to stand from elevated mat 2x10 HS curls 25lb 2x10 Leg Ext 10lb 2x10  Leg press 40lb 2x12 30lb resisted side steps x7 each 6in step ups x10 each  EVAL 09/30/23    PATIENT EDUCATION: Education details: POC and HEP Person educated: Patient Education method: Medical illustrator Education comprehension: verbalized understanding  HOME EXERCISE PROGRAM: Access Code: [email or web address] URL: [email or web address] Date: 09/30/2023 Prepared by: Almetta Fam  Exercises - Sit to Stand  - 1 x daily - 7 x weekly - 2 sets - 10 reps - Single Leg Stance  - 1 x daily - 7 x weekly - 5 reps - 10 hold - Tandem Stance in Corner  - 1 x daily - 7 x weekly - 3 reps - 15 hold - Seated Knee Extension with Resistance  - 1 x daily - 7 x weekly - 2 sets - 10 reps - Seated Hip Abduction with Resistance  - 1 x daily - 7 x weekly - 2 sets - 10 reps  GOALS: Goals reviewed with patient? Yes  SHORT TERM GOALS: Target date: 10/28/23  Patient will be independent with initial HEP. Baseline: given 09/30/23 Goal status: IN PROGRESS 11/11/23  2.  Patient will demonstrate decreased fall risk by scoring < 12 sec on TUG. Baseline: 13s Goal status: MET 11/11/23  3.  Patient will demonstrate SLS >10s on firm and foam surfaces BLE Baseline: 5-10s on firm Goal status: INITIAL   LONG TERM GOALS: Target date: 11/25/23  Patient will be independent with advanced/ongoing HEP to improve outcomes and carryover.  Baseline:  Goal status: INITIAL  2.  Patient will be able to do tandem hold for 30s bilaterally.  Baseline: needs help to step and holds for 15s Goal status: INITIAL  3.  Patient will be able to complete 10 consecutive tandem steps Baseline: 3 steps before LOB Goal status: INITIAL   4.  Patient will demonstrate improved functional LE strength as demonstrated by 5xSTS from chair height or  lowest level mat <14s. Baseline: 15s from elevated mat w/LOB, unable to do from chair height  Goal status: IN PROGRESS 18.60 from slightly elevated mat and boot on LLE  5.  Patient will increase quad strength to 5/5  Baseline: 3+ BLE Goal status: MET 11/11/23   ASSESSMENT:  CLINICAL IMPRESSION: Patient is a 23 y.o. male who was seen today for physical therapy treatment for encephalitis. He returns with a CAM boot on due to hurting his ankle playing basketball. Due to this we were limited in pushing his endurance today. Unable to retest all goals, especially for balance due to ankle problem today. He was told to minimize weight bearing while it heals, he does feel that it is almost healed. Patient still struggles with STS from low height. He will benefit from PT to address his strength and balance deficits to improve his stability and mobility to decrease his risk for falls.    OBJECTIVE IMPAIRMENTS: Abnormal gait, decreased balance, decreased coordination, decreased strength, and decreased safety awareness.   ACTIVITY LIMITATIONS: locomotion level    PERSONAL  FACTORS: Age, Behavior pattern, Fitness, Past/current experiences, Time since onset of injury/illness/exacerbation, and 3+ comorbidities: ADD, encephalitis, trach, PEG are also affecting patient's functional outcome.   REHAB POTENTIAL: Good  CLINICAL DECISION MAKING: Stable/uncomplicated  EVALUATION COMPLEXITY: Low  PLAN:  PT FREQUENCY: 1-2x/week  PT DURATION: 8 weeks  PLANNED INTERVENTIONS: 97110-Therapeutic exercises, 97530- Therapeutic activity, 97112- Neuromuscular re-education, 97535- Self Care, [id number]- Manual therapy, [phone]- Gait training, Patient/Family education, Balance training, Stair training, Cryotherapy, and Moist heat  PLAN FOR NEXT SESSION: strength and balance training   Tanda KANDICE Sorrow, PTA 11/25/2023, 11:46 AM   OUTPATIENT PHYSICAL THERAPY NEURO TREATMENT   Patient Name: Colton Zhang MRN:  [id number] DOB:Sep 29, 2000, 23 y.o., male Today's Date: 11/25/2023   PCP: No PCP REFERRING PROVIDER: Norleen Sheen  END OF SESSION:  PT End of Session - 11/25/23 1145     Visit Number 5    Date for PT Re-Evaluation 11/25/23    PT Start Time 1145    PT Stop Time 1230    PT Time Calculation (min) 45 min    Activity Tolerance Patient tolerated treatment well    Behavior During Therapy WFL for tasks assessed/performed           Past Medical History:  Diagnosis Date   ADD (attention deficit disorder)    Asthma    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    Past Surgical History:  Procedure Laterality Date   CIRCUMCISION     GASTROSTOMY W/ FEEDING TUBE     TRACHEOSTOMY     Patient Active Problem List   Diagnosis Date Noted   Acute deep vein thrombosis (DVT) of left lower extremity (HCC) 11/10/2023   Acute hypoxic respiratory failure (HCC) 11/25/2022   Status epilepticus (HCC) 11/25/2022   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   Asthma, chronic 11/18/2022   Hypertensive urgency 11/18/2022   Headache 09/16/2013   Migraine without aura 09/16/2013   ADD (attention deficit disorder) 09/16/2013   Transient alteration of awareness 09/16/2013    ONSET DATE: 09/16/23  REFERRING DIAG:  Z74.09 (ICD-10-CM) - Other reduced mobility  Z78.9 (ICD-10-CM) - Other specified health status  R56.9 (ICD-10-CM) - Unspecified convulsions    THERAPY DIAG:  Muscle weakness (generalized)  Unsteadiness on feet  Frontal lobe and executive function deficit  Rationale for Evaluation and Treatment: Rehabilitation  SUBJECTIVE:                                                                                                                                                                                             SUBJECTIVE STATEMENT: Pt enters with no boot, feeling pretty good  Pt accompanied by: family  member brother   PERTINENT HISTORY: Colton Zhang is a 23 y.o. left-hand-dominant male with PMHx significant  for asthma, ADD, chronic hepatitis B, MDR Pseudomonas infections, autoimmune encephalitis, and seizure who was admitted to [facility] [place] on 08/01/2023 with increased seizure frequency. He was transferred to [facility] on 08/03/23 for management of focal status epilepticus. He required intubation with escalation of antiepileptic agents. He was started on rituximab  for autoimmune encephalitis and then given cyclophosphamide. He was taken to the OR on 08/22/2023 by Dr. Valery for percutaneous tracheostomy and PEG. The patient's hospital course was complicated by MSSA ventilator associated pneumonia, acute left femoral DVT, macular rash on chest, transaminitis, pancytopenia, central diabetes insipidus, stage II sacral pressure ulcer, and hypotension. The patient made gradual progress and began to work in therapy. Independence was limited by decreased activity tolerance, impaired balance, decreased strength, and impaired coordination. He was admitted to inpatient rehabilitation for conference of therapy and continued medical management.    PAIN:  Are you having pain? No  PRECAUTIONS: None  RED FLAGS: None   WEIGHT BEARING RESTRICTIONS: No  FALLS: Has patient fallen in last 6 months? No  LIVING ENVIRONMENT: Lives with: lives with their family Lives in: House/apartment Stairs: Yes: External: 3 steps; on left going up Has following equipment at home: None  PLOF: Independent with basic ADLs  PATIENT GOALS: not really   OBJECTIVE:  Note: Objective measures were completed at Evaluation unless otherwise noted.  DIAGNOSTIC FINDINGS: FINDINGS: Brain: No acute intracranial hemorrhage. No focal mass lesion. No CT evidence of acute infarction. No midline shift or mass effect. No hydrocephalus. Basilar cisterns are patent.   Vascular: No hyperdense vessel or unexpected calcification.   Skull: Normal. Negative for fracture or focal lesion.   Sinuses/Orbits: Paranasal sinuses and mastoid air  cells are clear. Orbits are clear.   Other: None.   IMPRESSION: No acute intracranial findings  COGNITION: Overall cognitive status: History of cognitive impairments - at baseline   SENSATION: WFL  COORDINATION: Decreased coordination   MUSCLE LENGTH: Hamstrings: some HS tightness BLE   POSTURE: flexed trunk  and leaned forward at the hips  LOWER EXTREMITY ROM:   WFL   LOWER EXTREMITY MMT:    MMT Right Eval Left Eval  Hip flexion 4+ 4+  Hip extension    Hip abduction 4 4  Hip adduction 5 5  Hip internal rotation    Hip external rotation    Knee flexion 5 5  Knee extension 3+ 3+  Ankle dorsiflexion    Ankle plantarflexion    Ankle inversion    Ankle eversion    (Blank rows = not tested)   TRANSFERS: Sit to stand: unable to do from chair without UE push off  Assistive device utilized: None     Stand to sit: Modified independence  Assistive device utilized: None     Chair to chair: Modified independence  Assistive device utilized: None       STAIRS: Not tested GAIT: Findings: Gait Characteristics: ataxic, decreased trunk rotation, trunk flexed, narrow BOS, poor foot clearance- Right, and poor foot clearance- Left, Distance walked: in clinic distances, and Comments: some instability noted with gait, quad weakness  FUNCTIONAL TESTS:  5 times sit to stand: unable to do from chair, from elevated mat table 15s some LOB backwards Timed up and go (TUG): 13s Berg Balance Scale: 47/56  TREATMENT DATE:  11/25/23 Bike L 3 x 7 min GOALs  5x S2S 13.98 sec  SLS 10 sec    On airex LLE 10 sec RLE 10 sec  Tandem stance 30 sec RLE in front 24 sec LLE in front  Tandem walking  S2S on airex 2x10 Airex on 6in box step ups x10 each Heel raises Side step on and off airex   11/11/23 Recheck goals  Ankle 4 way with red band 20 reps  NuStep L5  x59mins  Shoulder ext 10# 2x10 Leg press 40# 2x10   10/17/23 NuStep L 5 x 6 min 8in step ups from airex forward & lateral  x 10 each   Heel raises black bar  HS curls 35lb 2x15 Leg Ext 10lb 2x10   10/14/23 NuStep L 5 x 6 min Sit to stand from elevated mat 2x10 HS curls 25lb 2x10 Leg Ext 10lb 2x10  Leg press 40lb 2x12 30lb resisted side steps x7 each 6in step ups x10 each  EVAL 09/30/23    PATIENT EDUCATION: Education details: POC and HEP Person educated: Patient Education method: Medical illustrator Education comprehension: verbalized understanding  HOME EXERCISE PROGRAM: Access Code: [email or web address] URL: [email or web address] Date: 09/30/2023 Prepared by: Almetta Fam  Exercises - Sit to Stand  - 1 x daily - 7 x weekly - 2 sets - 10 reps - Single Leg Stance  - 1 x daily - 7 x weekly - 5 reps - 10 hold - Tandem Stance in Corner  - 1 x daily - 7 x weekly - 3 reps - 15 hold - Seated Knee Extension with Resistance  - 1 x daily - 7 x weekly - 2 sets - 10 reps - Seated Hip Abduction with Resistance  - 1 x daily - 7 x weekly - 2 sets - 10 reps  GOALS: Goals reviewed with patient? Yes  SHORT TERM GOALS: Target date: 10/28/23  Patient will be independent with initial HEP. Baseline: given 09/30/23 Goal status: IN PROGRESS 11/11/23  2.  Patient will demonstrate decreased fall risk by scoring < 12 sec on TUG. Baseline: 13s Goal status: MET 11/11/23  3.  Patient will demonstrate SLS >10s on firm and foam surfaces BLE Baseline: 5-10s on firm Goal status: MET 11/25/23   LONG TERM GOALS: Target date: 11/25/23  Patient will be independent with advanced/ongoing HEP to improve outcomes and carryover.  Baseline:  Goal status: INITIAL  2.  Patient will be able to do tandem hold for 30s bilaterally.  Baseline: needs help to step and holds for 15s Goal status: 11/25/23 Progressing met with RLE in front, 24 seconds with L knee in front  3.  Patient will be able to  complete 10 consecutive tandem steps Baseline: 3 steps before LOB Goal status: Met 11/25/23   4.  Patient will demonstrate improved functional LE strength as demonstrated by 5xSTS from chair height or lowest level mat <14s. Baseline: 15s from elevated mat w/LOB, unable to do from chair height  Goal status: IN PROGRESS 18.60 from slightly elevated mat and boot on LLE  5.  Patient will increase quad strength to 5/5  Baseline: 3+ BLE Goal status: MET 11/11/23   ASSESSMENT:  CLINICAL IMPRESSION: Patient is a 23 y.o. male who was seen today for physical therapy treatment for encephalitis. He returns without  CAM boot. He has progressed meeting some long ans short term goals.  Patient still struggles with STS from low to normal height. In instability at times with step  ups utilizing airex. He will benefit from PT to address his strength and balance deficits to improve his stability and mobility to decrease his risk for falls.    OBJECTIVE IMPAIRMENTS: Abnormal gait, decreased balance, decreased coordination, decreased strength, and decreased safety awareness.   ACTIVITY LIMITATIONS: locomotion level    PERSONAL FACTORS: Age, Behavior pattern, Fitness, Past/current experiences, Time since onset of injury/illness/exacerbation, and 3+ comorbidities: ADD, encephalitis, trach, PEG are also affecting patient's functional outcome.   REHAB POTENTIAL: Good  CLINICAL DECISION MAKING: Stable/uncomplicated  EVALUATION COMPLEXITY: Low  PLAN:  PT FREQUENCY: 1-2x/week  PT DURATION: 8 weeks  PLANNED INTERVENTIONS: 97110-Therapeutic exercises, 97530- Therapeutic activity, 97112- Neuromuscular re-education, 97535- Self Care, 02859- Manual therapy, [phone]- Gait training, Patient/Family education, Balance training, Stair training, Cryotherapy, and Moist heat  PLAN FOR NEXT SESSION: strength and balance training   Tanda KANDICE Sorrow, PTA 11/25/2023, 11:46 AM

## 2023-11-25 NOTE — Therapy (Unsigned)
 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    Past Surgical History:  Procedure Laterality Date   CIRCUMCISION     GASTROSTOMY W/ FEEDING TUBE     TRACHEOSTOMY     Patient Active Problem List   Diagnosis Date Noted   Acute deep vein thrombosis (DVT) of left lower extremity (HCC) 11/10/2023   Acute hypoxic respiratory failure (HCC) 11/25/2022   Status epilepticus (HCC) 11/25/2022   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   Pt accompanied by (@eval ): self and family member; Nutritional therapist (brother)  PERTINENT HISTORY: Per chart review: Colton Zhang is a 23 y.o. 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 for management of status epilepticus.   PAIN:  Are you having pain? No    FALLS: Has patient fallen in last 6 months?  No  LIVING ENVIRONMENT: Lives with: lives with their family (son) Lives in: House/apartment  PLOF:  Level of assistance: Independent with ADLs, Independent with IADLs Employment: Environmental education officer employment; [employer]   Completed High School   PATIENT GOALS:   OBJECTIVE:  Note: Objective measures were completed at Evaluation unless otherwise noted.  DIAGNOSTIC FINDINGS: Per chart review (most recent):  CT Head Wo Contrast 07/31/23 IMPRESSION: No acute intracranial findings     Electronically Signed   By: Colton Zhang M.D.   On: 07/31/2023 16:16  COGNITION: Overall cognitive status: Impaired Areas of impairment:  Attention: Impaired: Alternating, Divided Memory: Impaired: Short term Prospective Awareness: Impaired: Emergent Executive function: Impaired: Organization, Planning, Error awareness, and Self-correction Functional deficits: Pt reports he feels back to baseline. Brother reports seeing some impairments at home re: forgetting information  COGNITIVE COMMUNICATION: Following directions: Follows multi-step commands inconsistently  Auditory comprehension: Impaired: suspect poor attention is contributing Verbal expression: WFL Functional communication: Impaired: Brother reports pt forgetting details of instructions/conversations  ORAL MOTOR EXAMINATION: Overall status: WFL Comments:   Pt with capped TRACH and PEG tube. Voice WNL at this time. Reports no difficulty with swallowing at this time and on a regular diet. To reassess if changes post trach removal.  STANDARDIZED ASSESSMENTS:  Cognitive Linguistic Quick Test: AGE - 18 - 69   The Cognitive Linguistic Quick Test (CLQT) was administered to assess the relative status  of five cognitive domains: attention, memory, language, executive functioning, and visuospatial skills. Scores from 10 tasks were used to estimate severity ratings (standardized for age groups 18-69  years and 70-89 years) for each domain, a clock drawing task, as well as an overall composite severity rating of cognition.       Task Score Criterion Cut Scores  Personal Facts 8/8 8  Symbol Cancellation 12/12 11  Confrontation Naming 10/10 10  Clock Drawing  10/13 12  Story Retelling 6/10 6  Symbol Trails 4/10 9  Generative Naming 4/9 5  Design Memory 6/6 5  Mazes  6/8 7  Design Generation 2/13 6    Cognitive Domain Composite Score Severity Rating  Attention 170/215 Mild  Memory 156/185 WNL  Executive Function 16/40 Moderate  Language 28/37 Mild  Visuospatial Skills 76/105 Mild  Clock Drawing  10/13 Mild  Composite Severity Rating  Mild       PATIENT REPORTED OUTCOME MEASURES (PROM): Cognitive Function: 112                                                                                                                            TREATMENT DATE:   11/25/23: Pt was seen for skilled ST services targeting cognitive-communication. SLP had long discussion with patient regarding motivation and awareness of deficits. Pt could not recall any recommendations from neuropsych evaluation. 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 This obviously has impacted his ability to follow written directions significantly.  SLP printed evaluation, reviewed recommendations, and wrote them less complex language into a word document. Pt was able to read document with minA. Pt is to focus on navigating sleep schedule - going to bed around 1am vs 3am to improve sleep hygiene. To provide compensations for reading.   11/21/23: Pt was seen for skilled ST services targeting cognitive-communication. Pt was late to today's session. He did return completed HEP task with 2 errors re: counting states on a data sheet. Pt is not carrying over strategy use at home and does not appear  to be invested in learning cognitive strategies. SLP facilitated today's session using real life cognitive task re: identifying which bank checking account option would be best. Pt required modA to complete. Pt continues to deflect discussions of deficits with humor - which tends to mask his impairments. Pt continues to struggle with mildly complex tasks. SLP to discuss discharge next session if pt is not feeling motivated to complete therapy at this time.   11/11/23: Pt was seen for skilled ST services targeting cognitive-communication. Pt continues to demonstrate little awareness of cognitive challenges, though SLP suspects this is because he is not being challenged at home. SLP facilitated session by instructing pt to complete task requiring attention and executive functioning skills. Pt required mod-maxA to complete. Pt provided excuses as to why this was challenging. SLP demonstrated  how pt could complete each task using an organizational strategy - breaking into steps, highlighting, and provided similarities between this task and his work. Pt to complete task for homework. To continue working on relevant tasks to encourage acknowledgement of difficulties to increase strategy use.   10/17/23: Pt was seen for skilled ST services targeting cognitive-communication. Pt forgot his folder today, but has tried to implement a better sleep routine. Pt continues to feel like he doesn't have many deficits, but when asked, he does feel like processing is slowed. SLP completed education on attention strategies. To complete higher level executive functioning tasks to increase pt awareness of impairments.   10/08/23: Pt was seen for skilled ST services targeting cognitive-communication. SLP initiated education on attention strategies; though, pt reports he doesn't have much difficulty with attention (even with ADD dx & performance on standardized test). SLP encouraged pt to remain aware and attempt to identify what we  are talking about when he is at home. Currently, pt reports poor sleep schedule. SLP suggested creating a routine to remain more consistent and decrease fatigue during the day. To cont .   10/02/23: Pt was seen for skilled ST services targeting continued assessment. Pt completed CLQT and Cognitive PROMs. SLP reviewed results with pt. No questions at this time. To begin with attention strategies next session.    PATIENT EDUCATION: Education details: Cognitive-communication and SLP role Person educated: Patient and brother Education method: Explanation Education comprehension: verbalized understanding and needs further education   GOALS: Goals reviewed with patient? Yes  SHORT TERM GOALS: Target date: 11/01/23  Complete PROMs Baseline: Goal status: INITIAL  2.  Complete CLQT and update goals Baseline:  Goal status: INITIAL  3.  Pt will verbalize 3 memory strategies to be used in recall of important information Baseline:  Goal status: INITIAL   4.  Pt will verbalize 3 strategies to increase focus on cognitive tasks Baseline:  Goal status: INITIAL  LONG TERM GOALS: Target date: 12/01/23  Improve score on PROMs Baseline:  Goal status: INITIAL  2.  Pt will report successful use of memory strategies at home Baseline:  Goal status: INITIAL   3.  Pt will report successful use of attention strategies at home Baseline:  Goal status: INITIAL  ASSESSMENT:  CLINICAL IMPRESSION: Pt is a 23 yo male who presents to ST OP for evaluation with complex medical history, specifically autoimmune encephalitis and seizures. Pt with tracheostomy and PEG. Pt trach is capped and PEG is not being used. Voice WNL at this time. Reports no difficulty with swallowing at this time and tolerating regular diet. To reassess if changes post trach removal. Pt endorses feeling like his normal self; Brother reports he feels like he has trouble remembering details and trouble with planning and organization.  Reports increased lack of motivation to complete tasks. No light or sound sensitivity. He reports he has had increase in mood swings - mostly surrounding his trach. He reports feeling re: irritable, anxious, and depressed. Affect was noted to be flat this session; though both brothers report no observable personality change. Pt was assessed using CLQT - to complete next session. SLP observed impaired awareness of deficits, impaired error awareness, difficulty following directions and slowed processing. SLP rec skilled ST services to address cognitive-communication impairment to maximize functional independence.    OBJECTIVE IMPAIRMENTS: include attention, memory, awareness, and executive functioning. These impairments are limiting patient from return to work, managing medications, managing appointments, managing finances, household responsibilities, and effectively communicating at home and  in community. Factors affecting potential to achieve goals and functional outcome are awareness of impairments.. Patient will benefit from skilled SLP services to address above impairments and improve overall function.  REHAB POTENTIAL: Good  PLAN:  SLP FREQUENCY: 1-2x/week  SLP DURATION: 8 weeks  PLANNED INTERVENTIONS: Environmental controls, Cueing hierachy, Internal/external aids, Functional tasks, SLP instruction and feedback, Compensatory strategies, Patient/family education, and [id number] Treatment of speech (30 or 45 min)     [employer], CCC-SLP 11/25/2023, 12:29 PM

## 2023-11-25 NOTE — Therapy (Signed)
 OUTPATIENT OCCUPATIONAL THERAPY NEURO Treatment  Patient Name: Colton Zhang MRN: [id number] DOB:[birth year], 23 y.o., male Today's Date: 11/25/2023  PCP: Verneita Tari Kiang PA-C REFERRING PROVIDER: Dr. 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    Past Surgical History:  Procedure Laterality Date   CIRCUMCISION     GASTROSTOMY W/ FEEDING TUBE     TRACHEOSTOMY     Patient Active Problem List   Diagnosis Date Noted   Acute deep vein thrombosis (DVT) of left lower extremity (HCC) 11/10/2023   Acute hypoxic respiratory failure (HCC) 11/25/2022   Status epilepticus (HCC) 11/25/2022   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   Asthma, chronic 11/18/2022   Hypertensive urgency 11/18/2022   Headache 09/16/2013   Migraine without aura 09/16/2013   ADD (attention deficit disorder) 09/16/2013   Transient alteration of awareness 09/16/2013    ONSET DATE: 09/24/23  REFERRING DIAG:  Diagnosis  Z74.09 (ICD-10-CM) - Other reduced mobility  Z78.9 (ICD-10-CM) - Other specified health status  G04.81 (ICD-10-CM) - Other encephalitis and encephalomyelitis    THERAPY DIAG:  Muscle weakness (generalized)  Unsteadiness on feet  Frontal lobe and executive function deficit  Attention and concentration deficit  Other lack of coordination  Other abnormalities of gait and mobility  Rationale for Evaluation and  Treatment: Rehabilitation  SUBJECTIVE:   SUBJECTIVE STATEMENT: Pt reports that he partially tore a ligament playing basketball and that's why he has a boot, but unable to state how long he has to wear boot or precautions.  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    Pt accompanied by: family member  PERTINENT HISTORY: Colton Zhang is a 23 y.o. left-hand-dominant male with PMHx significant for asthma, ADD, chronic hepatitis B, MDR Pseudomonas infections, autoimmune encephalitis, and seizure who was admitted to [facility] on 08/01/2023 with increased seizure frequency. He was transferred to [facility] on 08/03/23 for management of focal status epilepticus. He required intubation with escalation of antiepileptic agents. He was started on rituximab  for autoimmune encephalitis and then given cyclophosphamide. He was taken to the OR on 08/22/2023 by Dr. Valery for percutaneous tracheostomy and PEG. The patient's hospital course was complicated by MSSA ventilator associated pneumonia, acute left femoral DVT, macular rash on chest, transaminitis, pancytopenia, central diabetes insipidus, stage II sacral pressure ulcer, and hypotension.  PRECAUTIONS: Fall, no driving due to seizures, PEG, capped trach  WEIGHT BEARING RESTRICTIONS: No  PAIN: Denies pain    FALLS: Has patient fallen in last 6 months? No  LIVING ENVIRONMENT: Lives with: lives with their family   PLOF: Independent  PATIENT GOALS:  improve UE strength  OBJECTIVE:  Note: Objective measures were completed at Evaluation unless otherwise noted.  HAND DOMINANCE: Left  ADLs: Overall ADLs: mod I with all basic ADLs Transfers/ambulation related to ADLs: Eating: mod I Tub Shower transfers: mod I steps into tub   Pt was working in a warehouse prior to hospitalization   MOBILITY STATUS:  Independent  POSTURE COMMENTS:  rounded shoulders and forward head   ACTIVITY TOLERANCE: Activity tolerance: decreased activity tolerance overall   UPPER EXTREMITY ROM:  LUE WFLs  Active ROM Right eval Left eval  Shoulder flexion 105   Shoulder abduction 70   Shoulder adduction    Shoulder extension    Shoulder internal rotation    Shoulder external rotation    Elbow flexion WFL   Elbow extension -15   Wrist flexion    Wrist extension    Wrist ulnar deviation    Wrist radial deviation    Wrist pronation WFL   Wrist supination WFL   (Blank rows = not tested)  UPPER EXTREMITY MMT:     MMT Right eval Left eval  Shoulder flexion 3+/5 4-/5  Shoulder abduction    Shoulder adduction    Shoulder extension    Shoulder internal rotation    Shoulder external rotation    Middle trapezius    Lower trapezius    Elbow flexion    Elbow extension    Wrist flexion    Wrist extension    Wrist ulnar deviation    Wrist radial deviation    Wrist pronation    Wrist supination    (Blank rows = not tested)  HAND FUNCTION: Grip strength: Right: 72 lbs; Left: 90 lbs  COORDINATION: 9 Hole Peg test: Right: 21.16 sec; Left: 26.08, 25.53 with drops sec  SENSATION: WFL   COGNITION: Overall cognitive status: Impaired, pt's brother reports delayed processing speed. Pt with decreased awareness of higher level cognitve deficits.  Pt recalled 3/3/ words following a dealy. He completed Trail making B correctly, however pt previously perfromed alternating attention test with ST in prior session and did not complete. OT will further assess alternating attention in a functional context. Pt was unable to spell WORLD backwards correctly, he transposed o and r.  VISION ASSESSMENT: Not tested- pt denies visual changes hx of diplopia with onset of encephalitis, pt reports this has resolved Bells Test 34/35 items located   OBSERVATIONS: Pt with flat affect accompanied by his brother                                                                                                                              TREATMENT DATE: 11/25/23-  Pt attempted shoulder flexion with 3 lbs cane however signifcant scapular winging presetn so task was discontinued. Quadraped cat and cow, then rocking forwards and backwards for scapular/ shoulder stability mod facilitation/ v.c low range chest press with unweighted cane, min v.c, seated edge of mat lifting hips with UE's in  external rotation, mod facilitation v.c for scapular stability red theraband ex: for shoulder flex, ext, rows, shoulder bilateral ER, bicep curls, triceps ext  10 reps each with min-mod v.c. for proper positioning of R shoulder.  Alternating attention task, alternating symbols, level 3 on constant therapy 96 % accuracy, improved performance today Pt arrived without boot on LLE, pt to call MD for clarification of whether it is ok for pt to go without boot.  11/21/23:   Constant Therapy:  Alternating words for alternating attention (alphabetizing) with max difficulty despite cueing so discontinued.  Alternating symbols for alternating attention with 88% accuracy and 73.79 sec average response time.   Cane ex for shoulder flex and abduction with min v.c.  Followed by red theraband ex for shoulder flex, ext, rows, shoulder bilateral ER, bicep curls, triceps ext with min-mod v.c. for proper positioning of R shoulder.    Wt. Bearing through BUE with hands on mat, shoulders in ER with bridging off mat with min cueing for positioning/min facilitation.  Then wt. Bearing through R hand in abduction on mat with body on arm movements/lateral wt. Shift with min cueing/facilitation for scapular stability.     11/12/23-  Cane exercises in sitting for shoulder flex and abduction min v.c for RUE shoulder/ scapular positioning followed by red theraband ex for shoulder flex, ext, rows biceps curls, triceps extension with min v.c.  Perfromed  low-range  shoulder abduction with red band but discontinued due to compensation.  UBE x 6 mins level 5 for conditioning checked 9 hole peg test and pt met his goal.  10/17/23: Cane exercises in sitting for shoulder flex and abduction for stretch followed by red theraband ex for shoulder flex, ext, rows with min v.c.  Attempted low-range shoulder abduction with red band but d/c due to compensation.    Placing small pegs in pegboard to copy design for incr coordination with L hand and cognition with good accuracy and incr time.  Removing pegs by manipulating/translating 3 in hand with min difficulty.  Constant Therapy Alternating Symbol Match Level 4 for Alternating attention with 92% accuracy and 67.45 sec response time.  Copying PVC design for problem solving and attention for functional tasks.  Pt completed design with mod cueing initially (to sort pieces first and to use information sheet to problem solve which piece was correct).  Then pt was able to complete mod complex design with 1 cue for 2 omitted pieces at the end and overall incr time.  10/14/23- alternating attention task on constant therapy:  Altenating words level 1, 91% accuracy, response time was significantly increased to 153.67 secs Upgraded theraband exercises for rowing and shoulder extension to red band bilateral UE's 15 reps each min v.c Therapsit checked several goals see below. Ambulating while tossing a ball and naming animals for each letter of the alphabet, min-mod v.c and several LOB. Increased processing time required.  10/07/23- Reviewed HEP issued last visit, 10 reps each exercise min v.c for positioning Checked 9 hole peg test left per pt request. Gripper set at level 4 resistance to address sustained grip for picking up 1 inch blocks, min-mod difficulty, drops. Alternating attention task contstant therapy alternating symbols level 4, 94% accuracy    PATIENT EDUCATION: Education details:  see above  Education method:  Explanation, demonstration, v.c Education comprehension: verbalized understanding, returned demonstration, v.c.  HOME EXERCISE PROGRAM:  10/02/23- inital HEP, cane T-band  10/14/23- red t-band for shoulder extension and rows Theraband x15 each 10/17/23  added shoulder flex  with red theraband  GOALS: Goals reviewed with patient? Yes  SHORT TERM GOALS: Target date: 10/31/23  I with inital HEP Goal status: met, 10/08/23  2.  Pt will demonstrate ability to retreive a lightweight object at 110* shoulder flexion with RUE Goal status: met, 115* 10/14/23  3.  Pt will increase RUE grip strength by by 10 lbs for increased RUE functional use. Goal status: met, 85 lbs, 90 lbs 10/14/23  4.  Pt will perform an alternating attention task with 90% or better accuracy in rep for work activities. Goal status: ongoing, 91% today, level 1 for alternating words, increased processing time required. 10/14/23,  11/25/23- 96% accuracy for alternating symbols level 3, will check more complex alternating attantion task next visit to see how pt. performs for consistency  5.  Pt will improve LUE 9 hole peg test score by 3 secs without drops for increased ease with ADLs.  Goal status: ongoing, 24.36, 1 drop, 19.98 10/08/23, met 22.57 met    LONG TERM GOALS: Target date: 12/23/23  I with updated HEP Goal status: Ongoing, pt needs cueing for proper positioning/R shoulder compensation 11/21/23  2.  Pt will demonstrate ability to retreive a 5 lbs object at 115* shoulder flexion with RUE demonstrating good control. Goal status: ongoing, 11/21/23--pt demo decr scapular stability with IR/elevation of R shoulder  3.  Pt will demonstrate ability to perfom a physical and cognitve task simultaneously with 90% or better accuracy in prep for eventual driving and return to work. Goal status: ongoing, min-mod v.c 10/14/23  4.  Pt will perfrom simulated work activities mod I demonstrating good safety and positioning. Goal status:  ongoing 11/21/23  5.  Pt will perform prior level of home management and cooking mod I Goal status: ongoing, 11/25/23    ASSESSMENT:  CLINICAL IMPRESSION:   Pt demonstrates continued improving UE strength, but pt continues to demonstrate L scapular winging and compensation requiring v.c PERFORMANCE DEFICITS: in functional skills including ADLs, IADLs, coordination, dexterity, ROM, strength, flexibility, Fine motor control, Gross motor control, mobility, balance, endurance, decreased knowledge of precautions, decreased knowledge of use of DME, vision, and UE functional use, cognitive skills including attention, energy/drive, problem solving, safety awareness, sequencing, temperament/personality, thought, and understand, and psychosocial skills including coping strategies, environmental adaptation, habits, interpersonal interactions, and routines and behaviors.   IMPAIRMENTS: are limiting patient from ADLs, IADLs, work, play, leisure, and social participation.   CO-MORBIDITIES: may have co-morbidities  that affects occupational performance. Patient will benefit from skilled OT to address above impairments and improve overall function.  MODIFICATION OR ASSISTANCE TO COMPLETE EVALUATION: No modification of tasks or assist necessary to complete an evaluation.  OT OCCUPATIONAL PROFILE AND HISTORY: Detailed assessment: Review of records and additional review of physical, cognitive, psychosocial history related to current functional performance.  CLINICAL DECISION MAKING: LOW - limited treatment options, no task modification necessary  REHAB POTENTIAL: Good  EVALUATION COMPLEXITY: Low    PLAN:  OT FREQUENCY: 12 visits   OT DURATION: 12 weeks ( to account for scheduling)  PLANNED INTERVENTIONS: 97168 OT Re-evaluation, 97535 self care/ADL training, 02889 therapeutic exercise, 97530 therapeutic activity, 97112 neuromuscular re-education, 97140 manual therapy, 97116 gait training, 02886 aquatic  therapy, 97035 ultrasound, 97018 paraffin, 02960 fluidotherapy, 97010 moist heat, 97010 cryotherapy, 97129 Cognitive training (first 15 min), 02869 Cognitive training(each additional 15 min), passive range of motion, balance training, functional mobility training, visual/perceptual remediation/compensation, energy conservation, coping strategies training, patient/family education, and DME and/or AE instructions  RECOMMENDED OTHER SERVICES:  n/a  CONSULTED AND AGREED WITH PLAN OF CARE: Patient and family member/caregiver  PLAN FOR NEXT SESSION:  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

## 2023-12-03 NOTE — Therapy (Signed)
 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    Past Surgical History:  Procedure Laterality Date   CIRCUMCISION     GASTROSTOMY W/ FEEDING TUBE     TRACHEOSTOMY     Patient Active Problem List   Diagnosis Date Noted   Acute deep vein thrombosis (DVT) of left lower extremity (HCC) 11/10/2023   Acute hypoxic respiratory failure (HCC) 11/25/2022   Status epilepticus (HCC) 11/25/2022   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   Asthma, chronic 11/18/2022   Hypertensive urgency 11/18/2022   Headache 09/16/2013   Migraine without aura 09/16/2013   ADD (attention deficit disorder) 09/16/2013   Transient alteration of awareness 09/16/2013    ONSET DATE: 09/16/23  REFERRING DIAG:  Z74.09 (ICD-10-CM) - Other reduced mobility  Z78.9 (ICD-10-CM) - Other specified health status  R56.9 (ICD-10-CM) - Unspecified convulsions    THERAPY DIAG:  No diagnosis found.  Rationale for Evaluation and Treatment: Rehabilitation  SUBJECTIVE:                                                                                                                                                                                             SUBJECTIVE STATEMENT: Hurt my ankle playing basketball. Partially tore a ligament. I have been in this boot for 2 weeks.   Pt accompanied by: family member brother   PERTINENT HISTORY: Colton Zhang is a 23 y.o. left-hand-dominant male with PMHx significant for asthma, ADD, chronic hepatitis B, MDR Pseudomonas infections, autoimmune encephalitis, and seizure who was admitted to [facility] on 08/01/2023 with increased seizure frequency. He was transferred to [facility] on 08/03/23 for  management of focal status epilepticus. He required intubation with escalation of antiepileptic agents. He was started on rituximab  for autoimmune encephalitis and then given cyclophosphamide. He was taken to the OR on 08/22/2023 by Dr. Valery for percutaneous tracheostomy and PEG. The patient's hospital course was complicated by MSSA ventilator associated pneumonia, acute left femoral DVT, macular rash on chest, transaminitis, pancytopenia, central diabetes insipidus, stage II sacral pressure ulcer, and hypotension. The patient made gradual progress and began to work in therapy. Independence was limited by decreased activity tolerance, impaired balance, decreased strength, and impaired coordination. He was admitted to inpatient rehabilitation for conference of therapy and continued medical management.    PAIN:  Are you  having pain? No  PRECAUTIONS: None  RED FLAGS: None   WEIGHT BEARING RESTRICTIONS: No  FALLS: Has patient fallen in last 6 months? No  LIVING ENVIRONMENT: Lives with: lives with their family Lives in: House/apartment Stairs: Yes: External: 3 steps; on left going up Has following equipment at home: None  PLOF: Independent with basic ADLs  PATIENT GOALS: not really   OBJECTIVE:  Note: Objective measures were completed at Evaluation unless otherwise noted.  DIAGNOSTIC FINDINGS: FINDINGS: Brain: No acute intracranial hemorrhage. No focal mass lesion. No CT evidence of acute infarction. No midline shift or mass effect. No hydrocephalus. Basilar cisterns are patent.   Vascular: No hyperdense vessel or unexpected calcification.   Skull: Normal. Negative for fracture or focal lesion.   Sinuses/Orbits: Paranasal sinuses and mastoid air cells are clear. Orbits are clear.   Other: None.   IMPRESSION: No acute intracranial findings  COGNITION: Overall cognitive status: History of cognitive impairments - at baseline   SENSATION: WFL  COORDINATION: Decreased  coordination   MUSCLE LENGTH: Hamstrings: some HS tightness BLE   POSTURE: flexed trunk  and leaned forward at the hips  LOWER EXTREMITY ROM:   WFL   LOWER EXTREMITY MMT:    MMT Right Eval Left Eval  Hip flexion 4+ 4+  Hip extension    Hip abduction 4 4  Hip adduction 5 5  Hip internal rotation    Hip external rotation    Knee flexion 5 5  Knee extension 3+ 3+  Ankle dorsiflexion    Ankle plantarflexion    Ankle inversion    Ankle eversion    (Blank rows = not tested)   TRANSFERS: Sit to stand: unable to do from chair without UE push off  Assistive device utilized: None     Stand to sit: Modified independence  Assistive device utilized: None     Chair to chair: Modified independence  Assistive device utilized: None       STAIRS: Not tested GAIT: Findings: Gait Characteristics: ataxic, decreased trunk rotation, trunk flexed, narrow BOS, poor foot clearance- Right, and poor foot clearance- Left, Distance walked: in clinic distances, and Comments: some instability noted with gait, quad weakness  FUNCTIONAL TESTS:  5 times sit to stand: unable to do from chair, from elevated mat table 15s some LOB backwards Timed up and go (TUG): 13s Berg Balance Scale: 47/56                                                                                                                              TREATMENT DATE:  11/11/23 Recheck goals  Ankle 4 way with red band 20 reps  NuStep L5 x29mins  Shoulder ext 10# 2x10 Leg press 40# 2x10   10/17/23 NuStep L 5 x 6 min 8in step ups from airex forward & lateral  x 10 each   Heel raises black bar  HS curls 35lb 2x15 Leg Ext 10lb 2x10   10/14/23 NuStep  L 5 x 6 min Sit to stand from elevated mat 2x10 HS curls 25lb 2x10 Leg Ext 10lb 2x10  Leg press 40lb 2x12 30lb resisted side steps x7 each 6in step ups x10 each  EVAL 09/30/23    PATIENT EDUCATION: Education details: POC and HEP Person educated: Patient Education method:  Medical illustrator Education comprehension: verbalized understanding  HOME EXERCISE PROGRAM: Access Code: [email or web address] URL: [email or web address] Date: 09/30/2023 Prepared by: Almetta Fam  Exercises - Sit to Stand  - 1 x daily - 7 x weekly - 2 sets - 10 reps - Single Leg Stance  - 1 x daily - 7 x weekly - 5 reps - 10 hold - Tandem Stance in Corner  - 1 x daily - 7 x weekly - 3 reps - 15 hold - Seated Knee Extension with Resistance  - 1 x daily - 7 x weekly - 2 sets - 10 reps - Seated Hip Abduction with Resistance  - 1 x daily - 7 x weekly - 2 sets - 10 reps  GOALS: Goals reviewed with patient? Yes  SHORT TERM GOALS: Target date: 10/28/23  Patient will be independent with initial HEP. Baseline: given 09/30/23 Goal status: IN PROGRESS 11/11/23  2.  Patient will demonstrate decreased fall risk by scoring < 12 sec on TUG. Baseline: 13s Goal status: MET 11/11/23  3.  Patient will demonstrate SLS >10s on firm and foam surfaces BLE Baseline: 5-10s on firm Goal status: INITIAL   LONG TERM GOALS: Target date: 11/25/23  Patient will be independent with advanced/ongoing HEP to improve outcomes and carryover.  Baseline:  Goal status: INITIAL  2.  Patient will be able to do tandem hold for 30s bilaterally.  Baseline: needs help to step and holds for 15s Goal status: INITIAL  3.  Patient will be able to complete 10 consecutive tandem steps Baseline: 3 steps before LOB Goal status: INITIAL   4.  Patient will demonstrate improved functional LE strength as demonstrated by 5xSTS from chair height or lowest level mat <14s. Baseline: 15s from elevated mat w/LOB, unable to do from chair height  Goal status: IN PROGRESS 18.60 from slightly elevated mat and boot on LLE  5.  Patient will increase quad strength to 5/5  Baseline: 3+ BLE Goal status: MET 11/11/23   ASSESSMENT:  CLINICAL IMPRESSION: Patient is a 23 y.o. male who was seen today for physical therapy  treatment for encephalitis. He returns with a CAM boot on due to hurting his ankle playing basketball. Due to this we were limited in pushing his endurance today. Unable to retest all goals, especially for balance due to ankle problem today. He was told to minimize weight bearing while it heals, he does feel that it is almost healed. Patient still struggles with STS from low height. He will benefit from PT to address his strength and balance deficits to improve his stability and mobility to decrease his risk for falls.    OBJECTIVE IMPAIRMENTS: Abnormal gait, decreased balance, decreased coordination, decreased strength, and decreased safety awareness.   ACTIVITY LIMITATIONS: locomotion level    PERSONAL FACTORS: Age, Behavior pattern, Fitness, Past/current experiences, Time since onset of injury/illness/exacerbation, and 3+ comorbidities: ADD, encephalitis, trach, PEG are also affecting patient's functional outcome.   REHAB POTENTIAL: Good  CLINICAL DECISION MAKING: Stable/uncomplicated  EVALUATION COMPLEXITY: Low  PLAN:  PT FREQUENCY: 1-2x/week  PT DURATION: 8 weeks  PLANNED INTERVENTIONS: 97110-Therapeutic exercises, 97530- Therapeutic activity, [id number]- Neuromuscular re-education, 97535- Self Care, [id number]- Manual therapy, [id number]-  Gait training, Patient/Family education, Balance training, Stair training, Cryotherapy, and Moist heat  PLAN FOR NEXT SESSION: strength and balance training   Almetta Fam, PT 12/03/2023, 4:23 PM   OUTPATIENT PHYSICAL THERAPY NEURO TREATMENT   Patient Name: Colton Zhang MRN: [id number] DOB:2001/02/15, 23 y.o., male Today's Date: 12/03/2023   PCP: No PCP REFERRING PROVIDER: Norleen Zhang  END OF SESSION:     Past Medical History:  Diagnosis Date   ADD (attention deficit disorder)    Asthma    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    Past Surgical History:  Procedure Laterality Date   CIRCUMCISION     GASTROSTOMY W/ FEEDING TUBE     TRACHEOSTOMY      Patient Active Problem List   Diagnosis Date Noted   Acute deep vein thrombosis (DVT) of left lower extremity (HCC) 11/10/2023   Acute hypoxic respiratory failure (HCC) 11/25/2022   Status epilepticus (HCC) 11/25/2022   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   Asthma, chronic 11/18/2022   Hypertensive urgency 11/18/2022   Headache 09/16/2013   Migraine without aura 09/16/2013   ADD (attention deficit disorder) 09/16/2013   Transient alteration of awareness 09/16/2013    ONSET DATE: 09/16/23  REFERRING DIAG:  Z74.09 (ICD-10-CM) - Other reduced mobility  Z78.9 (ICD-10-CM) - Other specified health status  R56.9 (ICD-10-CM) - Unspecified convulsions    THERAPY DIAG:  No diagnosis found.  Rationale for Evaluation and Treatment: Rehabilitation  SUBJECTIVE:                                                                                                                                                                                             SUBJECTIVE STATEMENT: Pt enters with no boot, feeling pretty good  Pt accompanied by: family member brother   PERTINENT HISTORY: FERNIE GRIMM is a 23 y.o. left-hand-dominant male with PMHx significant for asthma, ADD, chronic hepatitis B, MDR Pseudomonas infections, autoimmune encephalitis, and seizure who was admitted to [facility] on 08/01/2023 with increased seizure frequency. He was transferred to [facility] on 08/03/23 for management of focal status epilepticus. He required intubation with escalation of antiepileptic agents. He was started on rituximab  for autoimmune encephalitis and then given cyclophosphamide. He was taken to the OR on 08/22/2023 by Dr. Valery for percutaneous tracheostomy and PEG. The patient's hospital course was complicated by MSSA ventilator associated pneumonia, acute left femoral DVT, macular rash on chest, transaminitis, pancytopenia, central diabetes insipidus, stage II sacral pressure ulcer, and hypotension. The  patient made gradual progress and began to work in therapy. Independence was limited by decreased activity tolerance, impaired balance, decreased strength, and impaired  coordination. He was admitted to inpatient rehabilitation for conference of therapy and continued medical management.    PAIN:  Are you having pain? No  PRECAUTIONS: None  RED FLAGS: None   WEIGHT BEARING RESTRICTIONS: No  FALLS: Has patient fallen in last 6 months? No  LIVING ENVIRONMENT: Lives with: lives with their family Lives in: House/apartment Stairs: Yes: External: 3 steps; on left going up Has following equipment at home: None  PLOF: Independent with basic ADLs  PATIENT GOALS: not really   OBJECTIVE:  Note: Objective measures were completed at Evaluation unless otherwise noted.  DIAGNOSTIC FINDINGS: FINDINGS: Brain: No acute intracranial hemorrhage. No focal mass lesion. No CT evidence of acute infarction. No midline shift or mass effect. No hydrocephalus. Basilar cisterns are patent.   Vascular: No hyperdense vessel or unexpected calcification.   Skull: Normal. Negative for fracture or focal lesion.   Sinuses/Orbits: Paranasal sinuses and mastoid air cells are clear. Orbits are clear.   Other: None.   IMPRESSION: No acute intracranial findings  COGNITION: Overall cognitive status: History of cognitive impairments - at baseline   SENSATION: WFL  COORDINATION: Decreased coordination   MUSCLE LENGTH: Hamstrings: some HS tightness BLE   POSTURE: flexed trunk  and leaned forward at the hips  LOWER EXTREMITY ROM:   WFL   LOWER EXTREMITY MMT:    MMT Right Eval Left Eval  Hip flexion 4+ 4+  Hip extension    Hip abduction 4 4  Hip adduction 5 5  Hip internal rotation    Hip external rotation    Knee flexion 5 5  Knee extension 3+ 3+  Ankle dorsiflexion    Ankle plantarflexion    Ankle inversion    Ankle eversion    (Blank rows = not tested)   TRANSFERS: Sit to stand:  unable to do from chair without UE push off  Assistive device utilized: None     Stand to sit: Modified independence  Assistive device utilized: None     Chair to chair: Modified independence  Assistive device utilized: None       STAIRS: Not tested GAIT: Findings: Gait Characteristics: ataxic, decreased trunk rotation, trunk flexed, narrow BOS, poor foot clearance- Right, and poor foot clearance- Left, Distance walked: in clinic distances, and Comments: some instability noted with gait, quad weakness  FUNCTIONAL TESTS:  5 times sit to stand: unable to do from chair, from elevated mat table 15s some LOB backwards Timed up and go (TUG): 13s Berg Balance Scale: 47/56                                                                                                                              TREATMENT DATE:  12/04/23 Bike Resisted gait 40#  Walking on beam SLS and tandem then with catch Step ups  Balance on BOSU Leg ext HS curls  11/25/23 Bike L 3 x 7 min GOALs  5x S2S 13.98 sec  SLS 10 sec  On airex LLE 10 sec RLE 10 sec  Tandem stance 30 sec RLE in front 24 sec LLE in front  Tandem walking  S2S on airex 2x10 Airex on 6in box step ups x10 each Heel raises Side step on and off airex   11/11/23 Recheck goals  Ankle 4 way with red band 20 reps  NuStep L5 x37mins  Shoulder ext 10# 2x10 Leg press 40# 2x10   10/17/23 NuStep L 5 x 6 min 8in step ups from airex forward & lateral  x 10 each   Heel raises black bar  HS curls 35lb 2x15 Leg Ext 10lb 2x10   10/14/23 NuStep L 5 x 6 min Sit to stand from elevated mat 2x10 HS curls 25lb 2x10 Leg Ext 10lb 2x10  Leg press 40lb 2x12 30lb resisted side steps x7 each 6in step ups x10 each  EVAL 09/30/23    PATIENT EDUCATION: Education details: POC and HEP Person educated: Patient Education method: Medical illustrator Education comprehension: verbalized understanding  HOME EXERCISE PROGRAM: Access Code:  [email or web address] URL: [email or web address] Date: 09/30/2023 Prepared by: Almetta Fam  Exercises - Sit to Stand  - 1 x daily - 7 x weekly - 2 sets - 10 reps - Single Leg Stance  - 1 x daily - 7 x weekly - 5 reps - 10 hold - Tandem Stance in Corner  - 1 x daily - 7 x weekly - 3 reps - 15 hold - Seated Knee Extension with Resistance  - 1 x daily - 7 x weekly - 2 sets - 10 reps - Seated Hip Abduction with Resistance  - 1 x daily - 7 x weekly - 2 sets - 10 reps  GOALS: Goals reviewed with patient? Yes  SHORT TERM GOALS: Target date: 10/28/23  Patient will be independent with initial HEP. Baseline: given 09/30/23 Goal status: IN PROGRESS 11/11/23  2.  Patient will demonstrate decreased fall risk by scoring < 12 sec on TUG. Baseline: 13s Goal status: MET 11/11/23  3.  Patient will demonstrate SLS >10s on firm and foam surfaces BLE Baseline: 5-10s on firm Goal status: MET 11/25/23   LONG TERM GOALS: Target date: 11/25/23  Patient will be independent with advanced/ongoing HEP to improve outcomes and carryover.  Baseline:  Goal status: INITIAL  2.  Patient will be able to do tandem hold for 30s bilaterally.  Baseline: needs help to step and holds for 15s Goal status: 11/25/23 Progressing met with RLE in front, 24 seconds with L knee in front  3.  Patient will be able to complete 10 consecutive tandem steps Baseline: 3 steps before LOB Goal status: Met 11/25/23   4.  Patient will demonstrate improved functional LE strength as demonstrated by 5xSTS from chair height or lowest level mat <14s. Baseline: 15s from elevated mat w/LOB, unable to do from chair height  Goal status: IN PROGRESS 18.60 from slightly elevated mat and boot on LLE  5.  Patient will increase quad strength to 5/5  Baseline: 3+ BLE Goal status: MET 11/11/23   ASSESSMENT:  CLINICAL IMPRESSION: Patient is a 23 y.o. male who was seen today for physical therapy treatment for encephalitis. He returns without  CAM boot.  He has progressed meeting some long ans short term goals.  Patient still struggles with STS from low to normal height. In instability at times with step ups utilizing airex. He will benefit from PT to address his strength and balance deficits to improve his stability and mobility to decrease  his risk for falls.    OBJECTIVE IMPAIRMENTS: Abnormal gait, decreased balance, decreased coordination, decreased strength, and decreased safety awareness.   ACTIVITY LIMITATIONS: locomotion level    PERSONAL FACTORS: Age, Behavior pattern, Fitness, Past/current experiences, Time since onset of injury/illness/exacerbation, and 3+ comorbidities: ADD, encephalitis, trach, PEG are also affecting patient's functional outcome.   REHAB POTENTIAL: Good  CLINICAL DECISION MAKING: Stable/uncomplicated  EVALUATION COMPLEXITY: Low  PLAN:  PT FREQUENCY: 1-2x/week  PT DURATION: 8 weeks  PLANNED INTERVENTIONS: 97110-Therapeutic exercises, 97530- Therapeutic activity, [id number]- Neuromuscular re-education, 97535- Self Care, 02859- Manual therapy, [phone]- Gait training, Patient/Family education, Balance training, Stair training, Cryotherapy, and Moist heat  PLAN FOR NEXT SESSION: strength and balance training   Almetta Fam, PT 12/03/2023, 4:23 PM

## 2023-12-04 ENCOUNTER — Ambulatory Visit: Admitting: Speech Pathology

## 2023-12-04 ENCOUNTER — Encounter: Payer: Self-pay | Admitting: Occupational Therapy

## 2023-12-04 ENCOUNTER — Ambulatory Visit: Admitting: Occupational Therapy

## 2023-12-04 ENCOUNTER — Ambulatory Visit

## 2023-12-04 DIAGNOSIS — M6281 Muscle weakness (generalized): Secondary | ICD-10-CM | POA: Diagnosis not present

## 2023-12-04 DIAGNOSIS — R41841 Cognitive communication deficit: Secondary | ICD-10-CM

## 2023-12-04 DIAGNOSIS — R2681 Unsteadiness on feet: Secondary | ICD-10-CM

## 2023-12-04 DIAGNOSIS — R41844 Frontal lobe and executive function deficit: Secondary | ICD-10-CM

## 2023-12-04 DIAGNOSIS — R41840 Attention and concentration deficit: Secondary | ICD-10-CM

## 2023-12-04 DIAGNOSIS — R278 Other lack of coordination: Secondary | ICD-10-CM

## 2023-12-04 NOTE — Therapy (Signed)
 OUTPATIENT OCCUPATIONAL THERAPY NEURO Treatment  Patient Name: Colton Zhang MRN: [id number] DOB:[birth year], 23 y.o., male Today's Date: 12/04/2023  PCP: Verneita Tari Kiang PA-C REFERRING PROVIDER: Dr. 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    Past Surgical History:  Procedure Laterality Date   CIRCUMCISION     GASTROSTOMY W/ FEEDING TUBE     TRACHEOSTOMY     Patient Active Problem List   Diagnosis Date Noted   Acute deep vein thrombosis (DVT) of left lower extremity (HCC) 11/10/2023   Acute hypoxic respiratory failure (HCC) 11/25/2022   Status epilepticus (HCC) 11/25/2022   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   Asthma, chronic 11/18/2022   Hypertensive urgency 11/18/2022   Headache 09/16/2013   Migraine without aura 09/16/2013   ADD (attention deficit disorder) 09/16/2013   Transient alteration of awareness 09/16/2013    ONSET DATE: 09/24/23  REFERRING DIAG:  Diagnosis  Z74.09 (ICD-10-CM) - Other reduced mobility  Z78.9 (ICD-10-CM) - Other specified health status  G04.81 (ICD-10-CM) - Other encephalitis and encephalomyelitis    THERAPY DIAG:  Muscle weakness (generalized)  Unsteadiness on feet  Other lack of coordination  Cognitive communication deficit  Frontal lobe and executive function deficit  Attention and concentration deficit  Rationale for Evaluation and Treatment:  Rehabilitation  SUBJECTIVE:   SUBJECTIVE STATEMENT: Pt reports that he partially tore a ligament playing basketball and that's why he has a boot, but unable to state how long he has to wear boot or precautions.  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    Pt accompanied by: family member  PERTINENT HISTORY: Colton Zhang is a 23 y.o. left-hand-dominant male with PMHx significant for asthma, ADD, chronic hepatitis B, MDR Pseudomonas infections, autoimmune encephalitis, and seizure who was admitted to [facility] [place] on 08/01/2023 with increased seizure frequency. He was transferred to [facility] on 08/03/23 for management of focal status epilepticus. He required intubation with escalation of antiepileptic agents. He was started on rituximab  for autoimmune encephalitis and then given cyclophosphamide. He was taken to the OR on 08/22/2023 by Dr. Valery for percutaneous tracheostomy and PEG. The patient's hospital course was complicated by MSSA ventilator associated pneumonia, acute left femoral DVT, macular rash on chest, transaminitis, pancytopenia, central diabetes insipidus, stage II sacral pressure ulcer, and hypotension.  PRECAUTIONS: Fall, no driving due to seizures, PEG, capped trach  WEIGHT BEARING RESTRICTIONS: No  PAIN: Denies pain    FALLS: Has patient fallen in last 6 months? No  LIVING ENVIRONMENT: Lives with: lives with their family   PLOF: Independent  PATIENT GOALS: improve UE strength  OBJECTIVE:  Note: Objective measures were completed at Evaluation unless otherwise noted.  HAND DOMINANCE: Left  ADLs: Overall ADLs: mod I with all basic ADLs Transfers/ambulation related to ADLs: Eating: mod I Tub Shower transfers: mod I steps into tub   Pt was working in a warehouse prior to hospitalization   MOBILITY STATUS: Independent  POSTURE COMMENTS:   rounded shoulders and forward head   ACTIVITY TOLERANCE: Activity tolerance: decreased activity tolerance overall   UPPER EXTREMITY ROM:  LUE WFLs  Active ROM Right eval Left eval  Shoulder flexion 105   Shoulder abduction 70   Shoulder adduction    Shoulder extension    Shoulder internal rotation    Shoulder external rotation    Elbow flexion WFL   Elbow extension -15   Wrist flexion    Wrist extension    Wrist ulnar deviation    Wrist radial deviation    Wrist pronation WFL   Wrist supination WFL   (Blank rows = not tested)  UPPER EXTREMITY MMT:     MMT Right eval Left eval  Shoulder flexion 3+/5 4-/5  Shoulder abduction    Shoulder adduction    Shoulder extension    Shoulder internal rotation    Shoulder external rotation    Middle trapezius    Lower trapezius    Elbow flexion    Elbow extension    Wrist flexion    Wrist extension    Wrist ulnar deviation    Wrist radial deviation    Wrist pronation    Wrist supination    (Blank rows = not tested)  HAND FUNCTION: Grip strength: Right: 72 lbs; Left: 90 lbs  COORDINATION: 9 Hole Peg test: Right: 21.16 sec; Left: 26.08, 25.53 with drops sec  SENSATION: WFL   COGNITION: Overall cognitive status: Impaired, pt's brother reports delayed processing speed. Pt with decreased awareness of higher level cognitve deficits.  Pt recalled 3/3/ words following a dealy. He completed Trail making B correctly, however pt previously perfromed alternating attention test with ST in prior session and did not complete. OT will further assess alternating attention in a functional context. Pt was unable to spell WORLD backwards correctly, he transposed o and r.  VISION ASSESSMENT: Not tested- pt denies visual changes hx of diplopia with onset of encephalitis, pt reports this has resolved Bells Test 34/35 items located   OBSERVATIONS: Pt with flat affect accompanied by his brother                                                                                                                              TREATMENT DATE: 12/04/23- UBE x 6 mins level 3 for conditioning. Strengthening with weight machine: shoulder extension, rows, bieps curls, 2 sets of 10 reps with 5 lbs each hand, triceps with bar bilateral UE's, 15 lbs, 2 sets 10 reps min v.c Amb while tossing medium ball and category generaiton for foods, several LOB while performing with min  v.c/ facilitation. Therapist discussed that this demonstrates that he is not ready to retrun to work due to difficulty multi tasking. Quadraped cat and cow, birdog with min facilitation/ v.c Seated edge of mat lifting hips x 5 reps all for scapular stability and UE strength, min facilitation 11/25/23-  Pt attempted shoulder flexion with 3 lbs cane however signifcant scapular winging presetn so task was discontinued. Quadraped cat and cow, then rocking forwards and backwards for scapular/ shoulder stability mod facilitation/ v.c low range chest press with unweighted cane, min v.c, seated edge of mat lifting hips with UE's in external rotation, mod facilitation v.c for scapular stability red theraband ex: for shoulder flex, ext, rows, shoulder bilateral ER, bicep curls, triceps ext  10 reps each with min-mod v.c. for proper positioning of R shoulder.  Alternating attention task, alternating symbols, level 3 on constant therapy 96 % accuracy, improved performance today Pt arrived without boot on LLE, pt to call MD for clarification of whether it is ok for pt to go without boot.  11/21/23:   Constant Therapy:  Alternating words for alternating attention (alphabetizing) with max difficulty despite cueing so discontinued.  Alternating symbols for alternating attention with 88% accuracy and 73.79 sec average response time.   Cane ex for shoulder flex and abduction with min v.c.  Followed by red theraband ex for shoulder flex, ext, rows, shoulder bilateral ER, bicep curls, triceps ext  with min-mod v.c. for proper positioning of R shoulder.    Wt. Bearing through BUE with hands on mat, shoulders in ER with bridging off mat with min cueing for positioning/min facilitation.  Then wt. Bearing through R hand in abduction on mat with body on arm movements/lateral wt. Shift with min cueing/facilitation for scapular stability.       PATIENT EDUCATION: Education details:  see above  Education method: Explanation, demonstration, v.c Education comprehension: verbalized understanding, returned demonstration, v.c.  HOME EXERCISE PROGRAM:  10/02/23- inital HEP, cane T-band  10/14/23- red t-band for shoulder extension and rows Theraband x15 each 10/17/23  added shoulder flex with red theraband  GOALS: Goals reviewed with patient? Yes  SHORT TERM GOALS: Target date: 10/31/23  I with inital HEP Goal status: met, 10/08/23  2.  Pt will demonstrate ability to retreive a lightweight object at 110* shoulder flexion with RUE Goal status: met, 115* 10/14/23  3.  Pt will increase RUE grip strength by by 10 lbs for increased RUE functional use. Goal status: met, 85 lbs, 90 lbs 10/14/23  4.  Pt will perform an alternating attention task with 90% or better accuracy in rep for work activities. Goal status: ongoing, 91% today, level 1 for alternating words, increased processing time required. 10/14/23,  11/25/23- 96% accuracy for alternating symbols level 3, will check more complex alternating attention task next visit to see how pt. performs for consistency (Pt attempted divided attention task today and had several LOB amb while tossing ball and naming foods. 12/04/23) 5.  Pt will improve LUE 9 hole peg test score by 3 secs without drops for increased ease with ADLs.  Goal status: ongoing, 24.36, 1 drop, 19.98 10/08/23, met 22.57 met    LONG TERM GOALS: Target date: 12/23/23  I with updated HEP Goal status: Ongoing, pt needs cueing for proper positioning/R shoulder compensation 11/21/23  2.  Pt  will demonstrate ability to retreive a 5 lbs object at 115* shoulder flexion with RUE demonstrating good control. Goal status: ongoing, 11/21/23--pt demo decr scapular stability with IR/elevation of R  shoulder  3.  Pt will demonstrate ability to perfom a physical and cognitve task simultaneously with 90% or better accuracy in prep for eventual driving and return to work. Goal status: ongoing, min-mod  difficulty 12/04/23 pt with LOB Pt attempted divided attention task today and had several LOB amb while tossing ball and naming foods.  4.  Pt will perfrom simulated work activities mod I demonstrating good safety and positioning. Goal status: ongoing 11/21/23  5.  Pt will perform prior level of home management and cooking mod I Goal status:met, 12/04/23    ASSESSMENT:  CLINICAL IMPRESSION:   Pt demonstrates continued improving UE strength, but pt. has scapular winging when malpositioned. Pt demonstrates difficulty multi tasking for a physical and cognitve task simultaneously.PERFORMANCE DEFICITS: in functional skills including ADLs, IADLs, coordination, dexterity, ROM, strength, flexibility, Fine motor control, Gross motor control, mobility, balance, endurance, decreased knowledge of precautions, decreased knowledge of use of DME, vision, and UE functional use, cognitive skills including attention, energy/drive, problem solving, safety awareness, sequencing, temperament/personality, thought, and understand, and psychosocial skills including coping strategies, environmental adaptation, habits, interpersonal interactions, and routines and behaviors.   IMPAIRMENTS: are limiting patient from ADLs, IADLs, work, play, leisure, and social participation.   CO-MORBIDITIES: may have co-morbidities  that affects occupational performance. Patient will benefit from skilled OT to address above impairments and improve overall function.  MODIFICATION OR ASSISTANCE TO COMPLETE EVALUATION: No modification of tasks or  assist necessary to complete an evaluation.  OT OCCUPATIONAL PROFILE AND HISTORY: Detailed assessment: Review of records and additional review of physical, cognitive, psychosocial history related to current functional performance.  CLINICAL DECISION MAKING: LOW - limited treatment options, no task modification necessary  REHAB POTENTIAL: Good  EVALUATION COMPLEXITY: Low    PLAN:  OT FREQUENCY: 12 visits   OT DURATION: 12 weeks ( to account for scheduling)  PLANNED INTERVENTIONS: 97168 OT Re-evaluation, 97535 self care/ADL training, 02889 therapeutic exercise, 97530 therapeutic activity, 97112 neuromuscular re-education, 97140 manual therapy, 97116 gait training, 02886 aquatic therapy, 97035 ultrasound, 97018 paraffin, 02960 fluidotherapy, 97010 moist heat, 97010 cryotherapy, 97129 Cognitive training (first 15 min), 02869 Cognitive training(each additional 15 min), passive range of motion, balance training, functional mobility training, visual/perceptual remediation/compensation, energy conservation, coping strategies training, patient/family education, and DME and/or AE instructions  RECOMMENDED OTHER SERVICES: n/a  CONSULTED AND AGREED WITH PLAN OF CARE: Patient and family member/caregiver  PLAN FOR NEXT SESSION:  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

## 2023-12-05 ENCOUNTER — Telehealth: Payer: Self-pay

## 2023-12-05 ENCOUNTER — Other Ambulatory Visit: Payer: Self-pay | Admitting: Hematology and Oncology

## 2023-12-05 DIAGNOSIS — I824Y2 Acute embolism and thrombosis of unspecified deep veins of left proximal lower extremity: Secondary | ICD-10-CM

## 2023-12-05 NOTE — Telephone Encounter (Signed)
 Spoke with patient's brother, Rogelia, to schedule follow-up appointment with Dr. Lonn to review results from doppler scheduled for 7/15.  Follow-up appointment scheduled for 12:20 PM on Friday, 7/25. Confirmed date and time of appointment with brother.  During phone call, brother had question related to patient's lovenox  injection and upcoming lumbar puncture scheduled for 7/28. Patient had been advised that he would need to be off of his lovenox  for 5 days prior to the procedure.  RN will clarify with Dr. Lonn. Brother okay with myChart message with answer to question. Message routed to Dr. Lonn for feedback. All other questions answered during call.

## 2023-12-05 NOTE — Telephone Encounter (Signed)
 At provider's request, reached out to patient's brother, Rogelia, to determine if patient was still in boot d/t recent injury. Per brother, patient is no longer in boot.  Brother aware that patient will be scheduled for US  venous doppler of leg and follow-up appointment will be scheduled. He is aware that the office will be in contact regarding these appointments. Brother verbalized an understanding of the information.

## 2023-12-05 NOTE — Telephone Encounter (Signed)
 OK to hold off for 5 days prior to LP

## 2023-12-09 ENCOUNTER — Ambulatory Visit (HOSPITAL_COMMUNITY)
Admission: RE | Admit: 2023-12-09 | Discharge: 2023-12-09 | Disposition: A | Discharge status: Home / Self Care | Source: Ambulatory Visit | Attending: Hematology and Oncology | Admitting: Hematology and Oncology

## 2023-12-09 DIAGNOSIS — I824Y2 Acute embolism and thrombosis of unspecified deep veins of left proximal lower extremity: Secondary | ICD-10-CM | POA: Diagnosis present

## 2023-12-10 ENCOUNTER — Encounter: Payer: Self-pay | Admitting: Speech Pathology

## 2023-12-10 ENCOUNTER — Ambulatory Visit

## 2023-12-10 ENCOUNTER — Ambulatory Visit: Admitting: Speech Pathology

## 2023-12-10 ENCOUNTER — Ambulatory Visit: Admitting: Occupational Therapy

## 2023-12-10 DIAGNOSIS — R41841 Cognitive communication deficit: Secondary | ICD-10-CM

## 2023-12-10 DIAGNOSIS — R41840 Attention and concentration deficit: Secondary | ICD-10-CM

## 2023-12-10 DIAGNOSIS — R41844 Frontal lobe and executive function deficit: Secondary | ICD-10-CM

## 2023-12-10 DIAGNOSIS — R2681 Unsteadiness on feet: Secondary | ICD-10-CM

## 2023-12-10 DIAGNOSIS — R278 Other lack of coordination: Secondary | ICD-10-CM

## 2023-12-10 DIAGNOSIS — R2689 Other abnormalities of gait and mobility: Secondary | ICD-10-CM

## 2023-12-10 DIAGNOSIS — M6281 Muscle weakness (generalized): Secondary | ICD-10-CM | POA: Diagnosis not present

## 2023-12-10 NOTE — Patient Instructions (Addendum)
 Bird Dog Pose - Intermediate- perform on floor    Keep pelvis stable. From table pose, step one leg back to plank pose. Reach opposite arm forward, back foot off floor.  Hold for _3___ breaths. Return arm and leg at same rate. Repeat 10-20___ times, alternating sides.  Cat / Cow Flow    Inhale, press spine toward ceiling like a Halloween cat. Keeping strength in arms and abdominals, exhale to soften spine through neutral and into cow pose. Open chest and arch back. Initiate movement between cat and cow at tailbone, one vertebrae at a time. Repeat _10___ times. then repeat rocking forwards and backwards  Copyright  [employer]. All rights reserved.   Strengthening: Resisted Flexion   Hold tubing with __one___ arm(s) at side. Pull forward and up. Move shoulder through pain-free range of motion. Repeat __10__ times per set.  Do _1-2_ sessions per day , every other day   Strengthening: Resisted Extension   Hold tubing in __one___ hand(s), arm forward. Pull arm back, elbow straight. Repeat _10___ times per set. Do _1-2___ sessions per day, every other day.      Elbow Flexion: Resisted   With tubing held in ______ hand(s) and other end secured under foot, curl arm up as far as possible. Repeat _10___ times per set. Do _1-2___ sessions per day, every other day.    Elbow Extension: Resisted   Sit in chair with resistive band secured at armrest (or hold with other hand) and __one_____ elbow bent. Straighten elbow. Repeat _10___ times per set.  Do _1-2___ sessions per day, every other day.   Copyright  VHI. All rights reserved.

## 2023-12-10 NOTE — Therapy (Signed)
 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    Past Surgical History:  Procedure Laterality Date   CIRCUMCISION     GASTROSTOMY W/ FEEDING TUBE     TRACHEOSTOMY     Patient Active Problem List   Diagnosis Date Noted   Acute deep vein thrombosis (DVT) of left lower extremity (HCC) 11/10/2023   Acute hypoxic respiratory failure (HCC) 11/25/2022   Status epilepticus (HCC) 11/25/2022   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   Asthma, chronic 11/18/2022   Hypertensive urgency 11/18/2022   Headache 09/16/2013   Migraine without aura 09/16/2013   ADD (attention deficit disorder) 09/16/2013   Transient alteration of awareness 09/16/2013    ONSET DATE: 09/16/23  REFERRING DIAG:  Z74.09 (ICD-10-CM) - Other reduced mobility  Z78.9 (ICD-10-CM) - Other specified health status  R56.9 (ICD-10-CM) - Unspecified convulsions    THERAPY DIAG:  Muscle weakness (generalized)  Unsteadiness on feet  Other lack of coordination  Rationale for Evaluation and Treatment: Rehabilitation  SUBJECTIVE:                                                                                                                                                                                             SUBJECTIVE STATEMENT: Pt enters with no boot, feeling pretty good  Pt accompanied by: family member brother   PERTINENT  HISTORY: Colton Zhang is a 23 y.o. left-hand-dominant male with PMHx significant for asthma, ADD, chronic hepatitis B, MDR Pseudomonas infections, autoimmune encephalitis, and seizure who was admitted to [facility] on 08/01/2023 with increased seizure frequency. He was transferred to [facility] on 08/03/23 for management of focal status epilepticus. He required intubation with escalation of antiepileptic agents. He was started on rituximab  for autoimmune encephalitis and then given cyclophosphamide. He was taken to the OR on 08/22/2023 by Dr. Valery for percutaneous tracheostomy and PEG. The patient's hospital course was  complicated by MSSA ventilator associated pneumonia, acute left femoral DVT, macular rash on chest, transaminitis, pancytopenia, central diabetes insipidus, stage II sacral pressure ulcer, and hypotension. The patient made gradual progress and began to work in therapy. Independence was limited by decreased activity tolerance, impaired balance, decreased strength, and impaired coordination. He was admitted to inpatient rehabilitation for conference of therapy and continued medical management.    PAIN:  Are you having pain? No  PRECAUTIONS: None  RED FLAGS: None   WEIGHT BEARING RESTRICTIONS: No  FALLS: Has patient fallen in last 6 months? No  LIVING ENVIRONMENT: Lives with: lives with their family Lives in: House/apartment Stairs: Yes: External: 3 steps; on left going up Has following equipment at home: None  PLOF: Independent with basic ADLs  PATIENT GOALS: not really   OBJECTIVE:  Note: Objective measures were completed at Evaluation unless otherwise noted.  DIAGNOSTIC FINDINGS: FINDINGS: Brain: No acute intracranial hemorrhage. No focal mass lesion. No CT evidence of acute infarction. No midline shift or mass effect. No hydrocephalus. Basilar cisterns are patent.   Vascular: No hyperdense vessel or unexpected calcification.   Skull: Normal. Negative for  fracture or focal lesion.   Sinuses/Orbits: Paranasal sinuses and mastoid air cells are clear. Orbits are clear.   Other: None.   IMPRESSION: No acute intracranial findings  COGNITION: Overall cognitive status: History of cognitive impairments - at baseline   SENSATION: WFL  COORDINATION: Decreased coordination   MUSCLE LENGTH: Hamstrings: some HS tightness BLE   POSTURE: flexed trunk  and leaned forward at the hips  LOWER EXTREMITY ROM:   WFL   LOWER EXTREMITY MMT:    MMT Right Eval Left Eval  Hip flexion 4+ 4+  Hip extension    Hip abduction 4 4  Hip adduction 5 5  Hip internal rotation    Hip external rotation    Knee flexion 5 5  Knee extension 3+ 3+  Ankle dorsiflexion    Ankle plantarflexion    Ankle inversion    Ankle eversion    (Blank rows = not tested)   TRANSFERS: Sit to stand: unable to do from chair without UE push off  Assistive device utilized: None     Stand to sit: Modified independence  Assistive device utilized: None     Chair to chair: Modified independence  Assistive device utilized: None       STAIRS: Not tested GAIT: Findings: Gait Characteristics: ataxic, decreased trunk rotation, trunk flexed, narrow BOS, poor foot clearance- Right, and poor foot clearance- Left, Distance walked: in clinic distances, and Comments: some instability noted with gait, quad weakness  FUNCTIONAL TESTS:  5 times sit to stand: unable to do from chair, from elevated mat table 15s some LOB backwards Timed up and go (TUG): 13s Berg Balance Scale: 47/56  TREATMENT DATE:  12/10/23 Tandem stance goal- 30s 5xSTS- 13.24s NuStep L5 x46mins Leg ext 10# 3x10 HS curls 25# 3x10  Step up on BOSU x10    12/04/23 Bike L4 x16mins  Resisted gait 40# 4 way x4  Step ups 6 Walking on beam SLS and tandem catching Balance on BOSU, then  reaching for numbers   11/25/23 Bike L 3 x 7 min GOALs  5x S2S 13.98 sec  SLS 10 sec    On airex LLE 10 sec RLE 10 sec  Tandem stance 30 sec RLE in front 24 sec LLE in front  Tandem walking  S2S on airex 2x10 Airex on 6in box step ups x10 each Heel raises Side step on and off airex   11/11/23 Recheck goals  Ankle 4 way with red band 20 reps  NuStep L5 x38mins  Shoulder ext 10# 2x10 Leg press 40# 2x10   10/17/23 NuStep L 5 x 6 min 8in step ups from airex forward & lateral  x 10 each   Heel raises black bar  HS curls 35lb 2x15 Leg Ext 10lb 2x10   10/14/23 NuStep L 5 x 6 min Sit to stand from elevated mat 2x10 HS curls 25lb 2x10 Leg Ext 10lb 2x10  Leg press 40lb 2x12 30lb resisted side steps x7 each 6in step ups x10 each  EVAL 09/30/23    PATIENT EDUCATION: Education details: POC and HEP Person educated: Patient Education method: Medical illustrator Education comprehension: verbalized understanding  HOME EXERCISE PROGRAM: Access Code: [email or web address] URL: [email or web address] Date: 09/30/2023 Prepared by: Almetta Fam  Exercises - Sit to Stand  - 1 x daily - 7 x weekly - 2 sets - 10 reps - Single Leg Stance  - 1 x daily - 7 x weekly - 5 reps - 10 hold - Tandem Stance in Corner  - 1 x daily - 7 x weekly - 3 reps - 15 hold - Seated Knee Extension with Resistance  - 1 x daily - 7 x weekly - 2 sets - 10 reps - Seated Hip Abduction with Resistance  - 1 x daily - 7 x weekly - 2 sets - 10 reps  GOALS: Goals reviewed with patient? Yes  SHORT TERM GOALS: Target date: 10/28/23  Patient will be independent with initial HEP. Baseline: given 09/30/23 Goal status: IN PROGRESS 11/11/23  2.  Patient will demonstrate decreased fall risk by scoring < 12 sec on TUG. Baseline: 13s Goal status: MET 11/11/23  3.  Patient will demonstrate SLS >10s on firm and foam surfaces BLE Baseline: 5-10s on firm Goal status: MET 11/25/23   LONG TERM GOALS: Target date:  11/25/23  Patient will be independent with advanced/ongoing HEP to improve outcomes and carryover.  Baseline:  Goal status: MET  2.  Patient will be able to do tandem hold for 30s bilaterally.  Baseline: needs help to step and holds for 15s Goal status: 11/25/23 Progressing met with RLE in front, 24 seconds with L knee in front, MET 12/10/23  3.  Patient will be able to complete 10 consecutive tandem steps Baseline: 3 steps before LOB Goal status: Met 11/25/23   4.  Patient will demonstrate improved functional LE strength as demonstrated by 5xSTS from chair height or lowest level mat <14s. Baseline: 15s from elevated mat w/LOB, unable to do from chair height  Goal status: IN PROGRESS 18.60 from slightly elevated mat and boot on LLE, 13.24s MET 12/10/23  5.  Patient will increase quad strength to 5/5  Baseline: 3+ BLE Goal status: MET 11/11/23   ASSESSMENT:  CLINICAL IMPRESSION: Patient is a 23 y.o. male who was seen today for physical therapy treatment for encephalitis. Continued to push his coordination and balance with higher level tasks. Unsteady with step ups on BOSU but otherwise does well. He has met all of his goals and pretty much back to normal function. Agreed to discharge today.    OBJECTIVE IMPAIRMENTS: Abnormal gait, decreased balance, decreased coordination, decreased strength, and decreased safety awareness.   ACTIVITY LIMITATIONS: locomotion level    PERSONAL FACTORS: Age, Behavior pattern, Fitness, Past/current experiences, Time since onset of injury/illness/exacerbation, and 3+ comorbidities: ADD, encephalitis, trach, PEG are also affecting patient's functional outcome.   REHAB POTENTIAL: Good  CLINICAL DECISION MAKING: Stable/uncomplicated  EVALUATION COMPLEXITY: Low  PLAN:  PT FREQUENCY: 1-2x/week  PT DURATION: 8 weeks  PLANNED INTERVENTIONS: 97110-Therapeutic exercises, 97530- Therapeutic activity, [id number]- Neuromuscular re-education, 97535- Self Care,  97140- Manual therapy, [phone]- Gait training, Patient/Family education, Balance training, Stair training, Cryotherapy, and Moist heat  PLAN FOR NEXT SESSION: d/c next visit  PHYSICAL THERAPY DISCHARGE SUMMARY  Visits from Start of Care: 7   Patient agrees to discharge. Patient goals were met. Patient is being discharged due to meeting the stated rehab goals.   [place], PT 12/10/2023, 2:43 PM

## 2023-12-10 NOTE — Therapy (Unsigned)
 OUTPATIENT OCCUPATIONAL THERAPY NEURO Treatment  Patient Name: Colton Zhang MRN: [id number] DOB:2001-01-25, 23 y.o., male Today's Date: 12/10/2023  PCP: Verneita Tari Kiang PA-C REFERRING PROVIDER: Dr. 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    Past Surgical History:  Procedure Laterality Date   CIRCUMCISION     GASTROSTOMY W/ FEEDING TUBE     TRACHEOSTOMY     Patient Active Problem List   Diagnosis Date Noted   Acute deep vein thrombosis (DVT) of left lower extremity (HCC) 11/10/2023   Acute hypoxic respiratory failure (HCC) 11/25/2022   Status epilepticus (HCC) 11/25/2022   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  Rationale for Evaluation and Treatment: Rehabilitation  SUBJECTIVE:   SUBJECTIVE STATEMENT: Pt reports that he partially tore a ligament playing basketball and that's why he has a boot, but unable to state how long he has to wear boot or precautions.  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    Pt accompanied by: family member  PERTINENT HISTORY: Colton Zhang is a 23 y.o. left-hand-dominant male with PMHx significant for asthma, ADD, chronic hepatitis B, MDR Pseudomonas infections, autoimmune encephalitis, and seizure who was admitted to [facility] on 08/01/2023 with increased seizure frequency. He was transferred to [facility] on 08/03/23 for management of focal status epilepticus. He required intubation with escalation of antiepileptic agents. He was started on rituximab  for autoimmune encephalitis and then given cyclophosphamide. He was taken to the OR on 08/22/2023 by Dr. Valery for percutaneous tracheostomy and PEG. The patient's hospital course was complicated by MSSA ventilator associated pneumonia, acute left femoral DVT, macular rash on chest, transaminitis, pancytopenia, central diabetes insipidus, stage II sacral pressure ulcer, and hypotension.  PRECAUTIONS: Fall, no driving due to seizures, PEG, capped trach  WEIGHT BEARING RESTRICTIONS: No  PAIN: Denies pain    FALLS: Has patient fallen in last 6 months? No  LIVING ENVIRONMENT: Lives with: lives with their family   PLOF: Independent  PATIENT GOALS: improve UE strength  OBJECTIVE:  Note: Objective measures were completed at Evaluation unless otherwise noted.  HAND DOMINANCE: Left  ADLs: Overall ADLs: mod I with all basic ADLs Transfers/ambulation related to ADLs: Eating: mod I Tub Shower transfers: mod I steps into tub  Pt was working in a warehouse prior to hospitalization   MOBILITY STATUS: Independent  POSTURE COMMENTS:  rounded shoulders and forward head   ACTIVITY TOLERANCE: Activity tolerance: decreased activity tolerance overall   UPPER EXTREMITY ROM:  LUE WFLs  Active ROM Right eval Left eval  Shoulder flexion 105   Shoulder abduction 70   Shoulder adduction    Shoulder extension    Shoulder  internal rotation    Shoulder external rotation    Elbow flexion WFL   Elbow extension -15   Wrist flexion    Wrist extension    Wrist ulnar deviation    Wrist radial deviation    Wrist pronation WFL   Wrist supination WFL   (Blank rows = not tested)  UPPER EXTREMITY MMT:     MMT Right eval Left eval  Shoulder flexion 3+/5 4-/5  Shoulder abduction    Shoulder adduction    Shoulder extension    Shoulder internal rotation    Shoulder external rotation    Middle trapezius    Lower trapezius    Elbow flexion    Elbow extension    Wrist flexion    Wrist extension    Wrist ulnar deviation    Wrist radial deviation    Wrist pronation    Wrist supination    (Blank rows = not tested)  HAND FUNCTION: Grip strength: Right: 72 lbs; Left: 90 lbs  COORDINATION: 9 Hole Peg test: Right: 21.16 sec; Left: 26.08, 25.53 with drops sec  SENSATION: WFL   COGNITION: Overall cognitive status: Impaired, pt's brother reports delayed processing speed. Pt with decreased awareness of higher level cognitve deficits.  Pt recalled 3/3/ words following a dealy. He completed Trail making B correctly, however pt previously perfromed alternating attention test with ST in prior session and did not complete. OT will further assess alternating attention in a functional context. Pt was unable to spell WORLD backwards correctly, he transposed o and r.  VISION ASSESSMENT: Not tested- pt denies visual changes hx of diplopia with onset of encephalitis, pt reports this has resolved Bells Test 34/35 items located   OBSERVATIONS: Pt with flat affect accompanied by his brother                                                                                                                             TREATMENT DATE: 12/10/23- discussion with pt/ bother regarding plans to continue therapy vs. D/c Pt would like to work [employer] plan of care then d/c in order to save therapy visits for later in the fall when he  prepares to return to work. Therapist reviewed HEP with pt/ brother, min v.c 10-15 reps. See pt instructions 12/04/23- UBE x 6 mins level 3 for conditioning. Strengthening with weight machine: shoulder extension, rows, bieps curls, 2 sets of 10 reps with 5 lbs each hand, triceps with bar bilateral UE's, 15 lbs, 2 sets 10 reps min v.c Amb while tossing medium ball  and category generaiton for foods, several LOB while performing with min v.c/ facilitation. Therapist discussed that this demonstrates that he is not ready to retrun to work due to difficulty multi tasking. Quadraped cat and cow, birdog with min facilitation/ v.c Seated edge of mat lifting hips x 5 reps all for scapular stability and UE strength, min facilitation 11/25/23-  Pt attempted shoulder flexion with 3 lbs cane however signifcant scapular winging presetn so task was discontinued. Quadraped cat and cow, then rocking forwards and backwards for scapular/ shoulder stability mod facilitation/ v.c low range chest press with unweighted cane, min v.c, seated edge of mat lifting hips with UE's in external rotation, mod facilitation v.c for scapular stability red theraband ex: for shoulder flex, ext, rows, shoulder bilateral ER, bicep curls, triceps ext  10 reps each with min-mod v.c. for proper positioning of R shoulder.  Alternating attention task, alternating symbols, level 3 on constant therapy 96 % accuracy, improved performance today Pt arrived without boot on LLE, pt to call MD for clarification of whether it is ok for pt to go without boot.  11/21/23:   Constant Therapy:  Alternating words for alternating attention (alphabetizing) with max difficulty despite cueing so discontinued.  Alternating symbols for alternating attention with 88% accuracy and 73.79 sec average response time.   Cane ex for shoulder flex and abduction with min v.c.  Followed by red theraband ex for shoulder flex, ext, rows, shoulder bilateral ER, bicep curls,  triceps ext with min-mod v.c. for proper positioning of R shoulder.    Wt. Bearing through BUE with hands on mat, shoulders in ER with bridging off mat with min cueing for positioning/min facilitation.  Then wt. Bearing through R hand in abduction on mat with body on arm movements/lateral wt. Shift with min cueing/facilitation for scapular stability.       PATIENT EDUCATION: Education details:  HEP- see pt instructions Education method: Explanation, demonstration, v.c Education comprehension: verbalized understanding, returned demonstration, v.c.  HOME EXERCISE PROGRAM:  10/02/23- inital HEP, cane T-band  10/14/23- red t-band for shoulder extension and rows Theraband x15 each 10/17/23  added shoulder flex with red theraband  GOALS: Goals reviewed with patient? Yes  SHORT TERM GOALS: Target date: 10/31/23  I with inital HEP Goal status: met, 10/08/23  2.  Pt will demonstrate ability to retreive a lightweight object at 110* shoulder flexion with RUE Goal status: met, 115* 10/14/23  3.  Pt will increase RUE grip strength by by 10 lbs for increased RUE functional use. Goal status: met, 85 lbs, 90 lbs 10/14/23  4.  Pt will perform an alternating attention task with 90% or better accuracy in rep for work activities. Goal status: ongoing, 91% today, level 1 for alternating words, increased processing time required. 10/14/23,  11/25/23- 96% accuracy for alternating symbols level 3, will check more complex alternating attention task next visit to see how pt. performs for consistency (Pt attempted divided attention task today and had several LOB amb while tossing ball and naming foods. 12/04/23) 5.  Pt will improve LUE 9 hole peg test score by 3 secs without drops for increased ease with ADLs.  Goal status: ongoing, 24.36, 1 drop, 19.98 10/08/23, met 22.57 met    LONG TERM GOALS: Target date: 12/23/23  I with updated HEP Goal status: Ongoing, pt needs cueing for proper positioning/R shoulder  compensation 11/21/23  2.  Pt will demonstrate ability to retreive a 5 lbs object at 115* shoulder flexion with RUE demonstrating good control.  Goal status: ongoing, 11/21/23--pt demo decr scapular stability with IR/elevation of R shoulder  3.  Pt will demonstrate ability to perfom a physical and cognitve task simultaneously with 90% or better accuracy in prep for eventual driving and return to work. Goal status: ongoing, min-mod  difficulty 12/04/23 pt with LOB Pt attempted divided attention task today and had several LOB amb while tossing ball and naming foods.   4.  Pt will perfrom simulated work activities mod I demonstrating good safety and positioning. Goal status: ongoing 11/21/23  5.  Pt will perform prior level of home management and cooking mod I Goal status:met, 12/04/23    ASSESSMENT:  CLINICAL IMPRESSION:   Pt demonstrates continued improving UE strength, but pt. has scapular winging when malpositioned. Pt/ brother agree to continue therapy x 2 more visits then d/cPERFORMANCE DEFICITS: in functional skills including ADLs, IADLs, coordination, dexterity, ROM, strength, flexibility, Fine motor control, Gross motor control, mobility, balance, endurance, decreased knowledge of precautions, decreased knowledge of use of DME, vision, and UE functional use, cognitive skills including attention, energy/drive, problem solving, safety awareness, sequencing, temperament/personality, thought, and understand, and psychosocial skills including coping strategies, environmental adaptation, habits, interpersonal interactions, and routines and behaviors.   IMPAIRMENTS: are limiting patient from ADLs, IADLs, work, play, leisure, and social participation.   CO-MORBIDITIES: may have co-morbidities  that affects occupational performance. Patient will benefit from skilled OT to address above impairments and improve overall function.  MODIFICATION OR ASSISTANCE TO COMPLETE EVALUATION: No modification of  tasks or assist necessary to complete an evaluation.  OT OCCUPATIONAL PROFILE AND HISTORY: Detailed assessment: Review of records and additional review of physical, cognitive, psychosocial history related to current functional performance.  CLINICAL DECISION MAKING: LOW - limited treatment options, no task modification necessary  REHAB POTENTIAL: Good  EVALUATION COMPLEXITY: Low    PLAN:  OT FREQUENCY: 12 visits   OT DURATION: 12 weeks ( to account for scheduling)  PLANNED INTERVENTIONS: 97168 OT Re-evaluation, 97535 self care/ADL training, 02889 therapeutic exercise, 97530 therapeutic activity, 97112 neuromuscular re-education, 97140 manual therapy, 97116 gait training, 02886 aquatic therapy, 97035 ultrasound, 97018 paraffin, 02960 fluidotherapy, 97010 moist heat, 97010 cryotherapy, 97129 Cognitive training (first 15 min), 02869 Cognitive training(each additional 15 min), passive range of motion, balance training, functional mobility training, visual/perceptual remediation/compensation, energy conservation, coping strategies training, patient/family education, and DME and/or AE instructions  RECOMMENDED OTHER SERVICES: n/a  CONSULTED AND AGREED WITH PLAN OF CARE: Patient and family member/caregiver  PLAN FOR NEXT SESSION:   2 more visits scheduled, then d/c 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

## 2023-12-11 NOTE — Therapy (Signed)
 OUTPATIENT SPEECH LANGUAGE PATHOLOGY TREATMENT & RECERTIFICATION   Patient Name: Colton Zhang MRN: [id number] DOB:[birth year], 23 y.o., male Today's Date: 12/11/2023  PCP: None Listed REFERRING PROVIDER: Therisa Rush, MD  END OF SESSION:  End of Session - 12/11/23 1156     Visit Number 8    Date for SLP Re-Evaluation 02/11/24    Authorization - Visit Number 1    Authorization - Number of Visits 9    SLP Start Time 1235    SLP Stop Time  1315    SLP Time Calculation (min) 40 min    Activity Tolerance Patient tolerated treatment well          Past Medical History:  Diagnosis Date   ADD (attention deficit disorder)    Asthma    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    Past Surgical History:  Procedure Laterality Date   CIRCUMCISION     GASTROSTOMY W/ FEEDING TUBE     TRACHEOSTOMY     Patient Active Problem List   Diagnosis Date Noted   Acute deep vein thrombosis (DVT) of left lower extremity (HCC) 11/10/2023   Acute hypoxic respiratory failure (HCC) 11/25/2022   Status epilepticus (HCC) 11/25/2022   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   Pt accompanied by (@eval ): self and family member; Nutritional therapist (brother)  PERTINENT HISTORY: Per chart review: Colton Zhang is a 23 y.o. 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   PAIN:  Are you having pain? No   FALLS: Has patient fallen in last 6 months?  No  LIVING ENVIRONMENT: Lives with: lives with their family (son) Lives in: House/apartment  PLOF:  Level of assistance: Independent with ADLs, Independent with IADLs Employment: Environmental education officer employment; [employer]   Completed High School   PATIENT GOALS:   OBJECTIVE:  Note: Objective measures were completed at Evaluation unless otherwise noted.  DIAGNOSTIC FINDINGS: Per chart review (most recent):  CT Head Wo Contrast 07/31/23 IMPRESSION: No acute intracranial findings     Electronically Signed   By: Jackquline Boxer M.D.   On: 07/31/2023 16:16  COGNITION: Overall cognitive status: Impaired Areas of impairment:  Attention: Impaired: Alternating, Divided Memory: Impaired: Short term Prospective Awareness: Impaired: Emergent Executive function: Impaired: Organization, Planning, Error awareness, and Self-correction Functional deficits: Pt reports he feels back to baseline. Brother reports seeing some impairments at home re: forgetting information  COGNITIVE COMMUNICATION: Following directions: Follows multi-step commands inconsistently  Auditory comprehension: Impaired: suspect poor attention is contributing Verbal expression: WFL Functional communication: Impaired: Brother reports pt forgetting details of instructions/conversations  ORAL MOTOR EXAMINATION: Overall status: WFL Comments:   Pt with capped TRACH and PEG tube. Voice WNL at this time. Reports no difficulty with swallowing at this time and on a regular diet. To reassess if changes post trach removal.  STANDARDIZED ASSESSMENTS:  Cognitive Linguistic Quick Test: AGE - 18 - 69  The Cognitive Linguistic Quick Test (CLQT) was administered to assess the relative status of five cognitive domains: attention, memory, language, executive functioning, and visuospatial skills. Scores from 10 tasks were  used to estimate severity ratings (standardized for age groups 18-69 years and 70-89 years) for each domain, a clock drawing task, as well as an overall composite severity rating of cognition.       Task Score Criterion Cut Scores  Personal Facts 8/8 8  Symbol Cancellation 12/12 11  Confrontation Naming 10/10 10  Clock Drawing  10/13 12  Story Retelling 6/10 6  Symbol Trails 4/10 9  Generative Naming 4/9 5  Design Memory 6/6 5  Mazes  6/8 7  Design Generation 2/13 6    Cognitive Domain Composite Score Severity Rating  Attention 170/215 Mild  Memory 156/185 WNL  Executive Function 16/40 Moderate  Language 28/37 Mild  Visuospatial Skills 76/105 Mild  Clock Drawing  10/13 Mild  Composite Severity Rating  Mild       PATIENT REPORTED OUTCOME MEASURES (PROM): Cognitive Function: 112                                                                                                                            TREATMENT DATE:   12/11/23: Pt was seen for skilled ST  services targeting education and pt barriers to follow through with strategies at home. Pt brought brother today per SLP written request. Brother reports pt has been taking over his medication with supervision. SLP reviewed concerns and previous conversations had with pt brother. Brother was not fully aware of neuropsych recommendations - SLP showed him where SLP had printed out paperwork and simplified recommendations for pt. Brother is to review. Pt continues to struggle with awareness of deficits which is a barrier to treatment. SLP educated on the importance of keeping a routine at home and promote better sleep hygiene. SLP collaborated with brother and pt to create a daily schedule. Pt confirmed he will attempt schedule for 1 week to see if this helps with fatigue. Brother is also to speak with doctor about changing schedule for depakote, as he takes this at 2p and this makes him very tired. Brother to attend future sessions. SLP  to recert for 9 additional visits. If pt is not making an effort to implement strategies, he will be discharged. Pt and brother verbalized understanding.   11/25/23: Pt was seen for skilled ST services targeting cognitive-communication. SLP had long discussion with patient regarding motivation and awareness of deficits. Pt could not recall any recommendations from neuropsych evaluation. 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 This obviously has impacted his ability to follow written directions significantly.  SLP printed evaluation, reviewed recommendations, and wrote them less complex language into a word document. Pt was able to read document with minA. Pt is to focus on navigating  sleep schedule - going to bed around 1am vs 3am to improve sleep hygiene. To provide compensations for reading.   11/21/23: Pt was seen for skilled ST services targeting cognitive-communication. Pt was late to today's session. He did return completed HEP task with 2 errors re: counting states on a data sheet. Pt is not carrying over strategy use at home and does not appear to be invested in learning cognitive strategies. SLP facilitated today's session using real life cognitive task re: identifying which bank checking account option would be best. Pt required modA to complete. Pt continues to deflect discussions of deficits with humor - which tends to mask his impairments. Pt continues to struggle with mildly complex tasks. SLP to discuss discharge next session if pt is not feeling motivated to complete therapy at this time.   11/11/23: Pt was seen for skilled ST services targeting cognitive-communication. Pt continues to demonstrate little awareness of cognitive challenges, though SLP suspects this is because he is not being challenged at home. SLP facilitated session by instructing pt to  complete task requiring attention and executive functioning skills. Pt required mod-maxA to complete. Pt provided excuses as to why this was challenging. SLP demonstrated how pt could complete each task using an organizational strategy - breaking into steps, highlighting, and provided similarities between this task and his work. Pt to complete task for homework. To continue working on relevant tasks to encourage acknowledgement of difficulties to increase strategy use.   10/17/23: Pt was seen for skilled ST services targeting cognitive-communication. Pt forgot his folder today, but has tried to implement a better sleep routine. Pt continues to feel like he doesn't have many deficits, but when asked, he does feel like processing is slowed. SLP completed education on attention strategies. To complete higher level executive functioning tasks to increase pt awareness of impairments.   10/08/23: Pt was seen for skilled ST services targeting cognitive-communication. SLP initiated education on attention strategies; though, pt reports he doesn't have much difficulty with attention (even with ADD dx & performance on standardized test). SLP encouraged pt to remain aware and attempt to identify what we are talking about when he is at home. Currently, pt reports poor sleep schedule. SLP suggested creating a routine to remain more consistent and decrease fatigue during the day. To cont .   10/02/23: Pt was seen for skilled ST services targeting continued assessment. Pt completed CLQT and Cognitive PROMs. SLP reviewed results with pt. No questions at this time. To begin with attention strategies next session.    PATIENT EDUCATION: Education details: Cognitive-communication and SLP role Person educated: Patient and brother Education method: Explanation Education comprehension: verbalized understanding and needs further education   GOALS: Goals reviewed with patient? Yes  SHORT TERM GOALS: Target date:  11/01/23  Complete PROMs Baseline: Goal status: NOT MET  2.  Complete CLQT and update goals Baseline:  Goal status:MET  3.  Pt will verbalize 3 memory strategies to be used in recall of important information Baseline:  Goal status: NOT MET   4.  Pt will verbalize 3 strategies to increase focus on cognitive tasks Baseline:  Goal status: NOT MET  LONG TERM GOALS: Target date: 12/01/23  Improve score on PROMs Baseline:  Goal status: NOT MET  2.  Pt will report successful use of memory strategies at home Baseline:  Goal status: NOT MET   3.  Pt will report successful use of attention strategies at home Baseline:  Goal status: NOT MET  ASSESSMENT:  CLINICAL  IMPRESSION: Pt is a 23 yo male who presents to ST OP for evaluation with complex medical history, specifically autoimmune encephalitis and seizures. See tx note. SLP to recert for additional visits to further progress towards treatment strategy implementation. SLP rec skilled ST services to address cognitive-communication impairment to maximize functional independence.    OBJECTIVE IMPAIRMENTS: include attention, memory, awareness, and executive functioning. These impairments are limiting patient from return to work, managing medications, managing appointments, managing finances, household responsibilities, and effectively communicating at home and in community. Factors affecting potential to achieve goals and functional outcome are awareness of impairments.. Patient will benefit from skilled SLP services to address above impairments and improve overall function.  REHAB POTENTIAL: Good  PLAN:  SLP FREQUENCY: 1-2x/week  SLP DURATION: 8 weeks  PLANNED INTERVENTIONS: Environmental controls, Cueing hierachy, Internal/external aids, Functional tasks, SLP instruction and feedback, Compensatory strategies, Patient/family education, and [id number] Treatment of speech (30 or 45 min)     [employer], CCC-SLP 12/11/2023, 11:58  AM

## 2023-12-17 ENCOUNTER — Ambulatory Visit: Admitting: Occupational Therapy

## 2023-12-17 ENCOUNTER — Encounter: Payer: Self-pay | Admitting: Occupational Therapy

## 2023-12-17 DIAGNOSIS — M6281 Muscle weakness (generalized): Secondary | ICD-10-CM

## 2023-12-17 DIAGNOSIS — R41840 Attention and concentration deficit: Secondary | ICD-10-CM

## 2023-12-17 DIAGNOSIS — R41844 Frontal lobe and executive function deficit: Secondary | ICD-10-CM

## 2023-12-17 DIAGNOSIS — R2681 Unsteadiness on feet: Secondary | ICD-10-CM

## 2023-12-17 DIAGNOSIS — R278 Other lack of coordination: Secondary | ICD-10-CM

## 2023-12-17 NOTE — Therapy (Signed)
 OUTPATIENT OCCUPATIONAL THERAPY NEURO Treatment  Patient Name: Colton Zhang MRN: [id number] DOB:11-03-2000, 23 y.o., male Today's Date: 12/17/2023  PCP: Verneita Tari Kiang PA-C REFERRING PROVIDER: Dr. 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    Past Surgical History:  Procedure Laterality Date   CIRCUMCISION     GASTROSTOMY W/ FEEDING TUBE     TRACHEOSTOMY     Patient Active Problem List   Diagnosis Date Noted   Acute deep vein thrombosis (DVT) of left lower extremity (HCC) 11/10/2023   Acute hypoxic respiratory failure (HCC) 11/25/2022   Status epilepticus (HCC) 11/25/2022   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   Asthma, chronic 11/18/2022   Hypertensive urgency 11/18/2022   Headache 09/16/2013   Migraine without aura 09/16/2013   ADD (attention deficit disorder) 09/16/2013   Transient alteration of awareness 09/16/2013    ONSET DATE: 09/24/23  REFERRING DIAG:  Diagnosis  Z74.09 (ICD-10-CM) - Other reduced mobility  Z78.9 (ICD-10-CM) - Other specified health status  G04.81 (ICD-10-CM) - Other encephalitis and encephalomyelitis    THERAPY DIAG:  Muscle weakness (generalized)  Unsteadiness on feet  Other lack of coordination  Frontal lobe and executive function deficit  Attention and concentration deficit  Rationale for Evaluation and Treatment: Rehabilitation  SUBJECTIVE:   SUBJECTIVE STATEMENT: Pt reports that he partially tore a ligament playing basketball and that's why he has a boot, but unable to state how long he has  to wear boot or precautions.  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    Pt accompanied by: family member  PERTINENT HISTORY: Colton Zhang is a 23 y.o. left-hand-dominant male with PMHx significant for asthma, ADD, chronic hepatitis B, MDR Pseudomonas infections, autoimmune encephalitis, and seizure who was admitted to [facility] on 08/01/2023 with increased seizure frequency. He was transferred to [facility] on 08/03/23 for management of focal status epilepticus. He required intubation with escalation of antiepileptic agents. He was started on rituximab  for autoimmune encephalitis and then given cyclophosphamide. He was taken to the OR on 08/22/2023 by Dr. Valery for percutaneous tracheostomy and PEG. The patient's hospital course was complicated by MSSA ventilator associated pneumonia, acute left femoral DVT, macular rash on chest, transaminitis, pancytopenia, central diabetes insipidus, stage II sacral pressure ulcer, and hypotension.  PRECAUTIONS: Fall, no driving due to seizures, PEG, capped trach  WEIGHT BEARING RESTRICTIONS: No  PAIN: Denies pain    FALLS: Has patient fallen in last 6 months? No  LIVING ENVIRONMENT: Lives with: lives with their family   PLOF: Independent  PATIENT GOALS: improve UE strength  OBJECTIVE:  Note: Objective measures were completed at Evaluation unless otherwise noted.  HAND DOMINANCE: Left  ADLs: Overall ADLs: mod I with all basic ADLs Transfers/ambulation related to  ADLs: Eating: mod I Tub Shower transfers: mod I steps into tub   Pt was working in a warehouse prior to hospitalization   MOBILITY STATUS: Independent  POSTURE COMMENTS:  rounded shoulders and forward head   ACTIVITY TOLERANCE: Activity tolerance: decreased activity tolerance overall   UPPER EXTREMITY ROM:  LUE WFLs  Active ROM Right eval  Left eval  Shoulder flexion 105   Shoulder abduction 70   Shoulder adduction    Shoulder extension    Shoulder internal rotation    Shoulder external rotation    Elbow flexion WFL   Elbow extension -15   Wrist flexion    Wrist extension    Wrist ulnar deviation    Wrist radial deviation    Wrist pronation WFL   Wrist supination WFL   (Blank rows = not tested)  UPPER EXTREMITY MMT:     MMT Right eval Left eval  Shoulder flexion 3+/5 4-/5  Shoulder abduction    Shoulder adduction    Shoulder extension    Shoulder internal rotation    Shoulder external rotation    Middle trapezius    Lower trapezius    Elbow flexion    Elbow extension    Wrist flexion    Wrist extension    Wrist ulnar deviation    Wrist radial deviation    Wrist pronation    Wrist supination    (Blank rows = not tested)  HAND FUNCTION: Grip strength: Right: 72 lbs; Left: 90 lbs  COORDINATION: 9 Hole Peg test: Right: 21.16 sec; Left: 26.08, 25.53 with drops sec  SENSATION: WFL   COGNITION: Overall cognitive status: Impaired, pt's brother reports delayed processing speed. Pt with decreased awareness of higher level cognitve deficits.  Pt recalled 3/3/ words following a dealy. He completed Trail making B correctly, however pt previously perfromed alternating attention test with ST in prior session and did not complete. OT will further assess alternating attention in a functional context. Pt was unable to spell WORLD backwards correctly, he transposed o and r.  VISION ASSESSMENT: Not tested- pt denies visual changes hx of diplopia with onset of encephalitis, pt reports this has resolved Bells Test 34/35 items located   OBSERVATIONS: Pt with flat affect accompanied by his brother                                                                                                                             TREATMENT DATE:12/17/23 UBE x 8 mins level 4 for conditioning Seated rows and chest bress  bilateral UE's with 10 lbs, 10 x each, min v.c for positioning. Ambulating 50 ft with 33 lbs box in prep for return to work without LOB. Pt attempted to lift 28 lbs box from floor to mat and back, pt with LOB reuiring assist. task was discontinued. HE doe not demonstrate adequate balance for this activity. constant therapy:alternating  symbols level 6, 10 items with 96% accuracy.  12/10/23- discussion with pt/  brother regarding plans to continue therapy vs. D/c Pt would like to work until plan of care then d/c in order to save therapy visits for later in the fall when he prepares to return to work. Therapist reviewed HEP with pt/ brother, min v.c 10-15 reps. See pt instructions 12/04/23- UBE x 6 mins level 3 for conditioning. Strengthening with weight machine: shoulder extension, rows, biceps curls, 2 sets of 10 reps with 5 lbs each hand, triceps with bar bilateral UE's, 15 lbs, 2 sets 10 reps min v.c Amb while tossing medium ball and category generaiton for foods, several LOB while performing with min v.c/ facilitation. Therapist discussed that this demonstrates that he is not ready to retrun to work due to difficulty multi tasking. Quadraped cat and cow, birdog with min facilitation/ v.c Seated edge of mat lifting hips x 5 reps all for scapular stability and UE strength, min facilitation 11/25/23-  Pt attempted shoulder flexion with 3 lbs cane however signifcant scapular winging presetn so task was discontinued. Quadraped cat and cow, then rocking forwards and backwards for scapular/ shoulder stability mod facilitation/ v.c low range chest press with unweighted cane, min v.c, seated edge of mat lifting hips with UE's in external rotation, mod facilitation v.c for scapular stability red theraband ex: for shoulder flex, ext, rows, shoulder bilateral ER, bicep curls, triceps ext  10 reps each with min-mod v.c. for proper positioning of R shoulder.  Alternating attention task, alternating symbols, level 3  on constant therapy 96 % accuracy, improved performance today Pt arrived without boot on LLE, pt to call MD for clarification of whether it is ok for pt to go without boot.  11/21/23:   Constant Therapy:  Alternating words for alternating attention (alphabetizing) with max difficulty despite cueing so discontinued.  Alternating symbols for alternating attention with 88% accuracy and 73.79 sec average response time.   Cane ex for shoulder flex and abduction with min v.c.  Followed by red theraband ex for shoulder flex, ext, rows, shoulder bilateral ER, bicep curls, triceps ext with min-mod v.c. for proper positioning of R shoulder.    Wt. Bearing through BUE with hands on mat, shoulders in ER with bridging off mat with min cueing for positioning/min facilitation.  Then wt. Bearing through R hand in abduction on mat with body on arm movements/lateral wt. Shift with min cueing/facilitation for scapular stability.       PATIENT EDUCATION: Education details: see above Education method: Explanation, demonstration, v.c Education comprehension: verbalized understanding, returned demonstration, v.c.  HOME EXERCISE PROGRAM:  10/02/23- inital HEP, cane T-band  10/14/23- red t-band for shoulder extension and rows Theraband x15 each 10/17/23  added shoulder flex with red theraband  GOALS: Goals reviewed with patient? Yes  SHORT TERM GOALS: Target date: 10/31/23  I with inital HEP Goal status: met, 10/08/23  2.  Pt will demonstrate ability to retreive a lightweight object at 110* shoulder flexion with RUE Goal status: met, 115* 10/14/23  3.  Pt will increase RUE grip strength by by 10 lbs for increased RUE functional use. Goal status: met, 85 lbs, 90 lbs 10/14/23  4.  Pt will perform an alternating attention task with 90% or better accuracy in rep for work activities. Goal status: ongoing, 91% today, level 1 for alternating words, increased processing time required. 10/14/23,  11/25/23- 96% accuracy for  alternating symbols level 3, will check more complex alternating attention task next visit to see how pt. performs for consistency (Pt attempted divided attention task today  and had several LOB amb while tossing ball and naming foods. 12/04/23) constant therapy:alternating  symbols level 6, 10 items with 96% accuracy. 12/17/23 5.  Pt will improve LUE 9 hole peg test score by 3 secs without drops for increased ease with ADLs.  Goal status:  19.98 10/08/23, met 22.57 met    LONG TERM GOALS: Target date: 12/23/23  I with updated HEP Goal status: met for theraband 12/09/24, pt's brother observed and is able to help pt prn  2.  Pt will demonstrate ability to retreive a 5 lbs object at 115* shoulder flexion with RUE demonstrating good control. Goal status: ongoing, 11/21/23--pt demo decr scapular stability with IR/elevation of R shoulder  3.  Pt will demonstrate ability to perfom a physical and cognitve task simultaneously with 90% or better accuracy in prep for eventual driving and return to work. Goal status: ongoing, min-mod  difficulty 12/04/23 pt with LOB Pt attempted divided attention task today and had several LOB amb while tossing ball and naming foods.  Ambulating while performing category generation for animals, min v.c and no LOB- 12/17/23  4.  Pt will perfrom simulated work activities mod I demonstrating good safety and positioning. Goal status: ongoing, pt attempted to lift 28 lbs box from floor to mat and had LOB requiring assist, pt does not demonstrate adequate blance at this time for work activities.  5.  Pt will perform prior level of home management and cooking mod I Goal status:met, 12/04/23    ASSESSMENT:  CLINICAL IMPRESSION:  Pt demonstrates progress towards goals. He demonstrates improving strength and alternating attention. Pt is not ready to return to work, he does not demonstrate adequate balance to lift items from floor.  PERFORMANCE DEFICITS: in functional skills  including ADLs, IADLs, coordination, dexterity, ROM, strength, flexibility, Fine motor control, Gross motor control, mobility, balance, endurance, decreased knowledge of precautions, decreased knowledge of use of DME, vision, and UE functional use, cognitive skills including attention, energy/drive, problem solving, safety awareness, sequencing, temperament/personality, thought, and understand, and psychosocial skills including coping strategies, environmental adaptation, habits, interpersonal interactions, and routines and behaviors.   IMPAIRMENTS: are limiting patient from ADLs, IADLs, work, play, leisure, and social participation.   CO-MORBIDITIES: may have co-morbidities  that affects occupational performance. Patient will benefit from skilled OT to address above impairments and improve overall function.  MODIFICATION OR ASSISTANCE TO COMPLETE EVALUATION: No modification of tasks or assist necessary to complete an evaluation.  OT OCCUPATIONAL PROFILE AND HISTORY: Detailed assessment: Review of records and additional review of physical, cognitive, psychosocial history related to current functional performance.  CLINICAL DECISION MAKING: LOW - limited treatment options, no task modification necessary  REHAB POTENTIAL: Good  EVALUATION COMPLEXITY: Low    PLAN:  OT FREQUENCY: 12 visits   OT DURATION: 12 weeks ( to account for scheduling)  PLANNED INTERVENTIONS: 97168 OT Re-evaluation, 97535 self care/ADL training, 02889 therapeutic exercise, 97530 therapeutic activity, 97112 neuromuscular re-education, 97140 manual therapy, 97116 gait training, 02886 aquatic therapy, 97035 ultrasound, 97018 paraffin, 02960 fluidotherapy, 97010 moist heat, 97010 cryotherapy, 97129 Cognitive training (first 15 min), 02869 Cognitive training(each additional 15 min), passive range of motion, balance training, functional mobility training, visual/perceptual remediation/compensation, energy conservation, coping  strategies training, patient/family education, and DME and/or AE instructions  RECOMMENDED OTHER SERVICES: n/a  CONSULTED AND AGREED WITH PLAN OF CARE: Patient and family member/caregiver  PLAN FOR NEXT SESSION:   check goals and d/c next visit 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

## 2023-12-19 ENCOUNTER — Inpatient Hospital Stay: Attending: Hematology and Oncology | Admitting: Hematology and Oncology

## 2023-12-19 ENCOUNTER — Encounter: Payer: Self-pay | Admitting: Hematology and Oncology

## 2023-12-19 VITALS — BP 125/78 | HR 61 | Resp 18 | Ht 71.0 in | Wt 194.0 lb

## 2023-12-19 DIAGNOSIS — I82402 Acute embolism and thrombosis of unspecified deep veins of left lower extremity: Secondary | ICD-10-CM | POA: Diagnosis present

## 2023-12-19 DIAGNOSIS — I824Y2 Acute embolism and thrombosis of unspecified deep veins of left proximal lower extremity: Secondary | ICD-10-CM | POA: Diagnosis not present

## 2023-12-19 NOTE — Progress Notes (Signed)
 [facility] OFFICE PROGRESS NOTE  Patient Care Team: Dyane Anthony RAMAN, FNP as PCP - General (Family Medicine)  Assessment & Plan Acute deep vein thrombosis (DVT) of proximal vein of left lower extremity (HCC) His recent left lower extremity DVT is provoked by severe illness and prolonged hospitalization Colton Zhang needs to be on anticoagulation therapy for minimum of 3 months Recent repeat venous Doppler ultrasound showed near complete resolution of DVT on the left with some mild chronic changes seen Colton Zhang will place Lovenox  on hold in anticipation for future lumbar puncture After that, Colton Zhang will resume taking Lovenox  the following day and to complete the rest of his prescription Colton Zhang does not need further refills We discussed risk factors associated with recurrent DVT including dehydration and prolonged immobility I will see him in the future for perioperative anticoagulation management  No orders of the defined types were placed in this encounter.    Almarie Bedford, MD  INTERVAL HISTORY: Colton Zhang returns for surveillance follow-up for history of DVT/PE Patient denies recent bleeding such as epistaxis, hematuria or hematochezia We reviewed medication list and discussed medication changes We discussed test results and future plan of care as outlined above  PHYSICAL EXAMINATION: ECOG PERFORMANCE STATUS: 0 - Asymptomatic  Vitals:   12/19/23 1244  BP: 125/78  Pulse: 61  Resp: 18  SpO2: 99%   Lab Results  Component Value Date   WBC 5.6 10/24/2023   HGB 13.7 10/24/2023   HCT 40.6 10/24/2023   MCV 84.4 10/24/2023   PLT 331 10/24/2023    SUMMARY OF HEMATOLOGIC HISTORY:  Colton Zhang 23 y.o. 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  The encephalitis cause refractory status epilepticus and respiratory failure.  The patient was subsequently transferred to [facility] for further management   On December 02, 2022, the patient was found to have acute superficial vein thrombosis of the basilic vein near the antecubital fossa thought to be related to PICC line placement. On December 15, 2022, Colton Zhang was found to have partial occlusive acute deep vein thrombosis of the subclavian vein attached to the PICC line With a protracted hospital stay, the patient ended up with tracheostomy placement, prolonged rehabilitation and immobility  This year, on July 31, 2023, the patient presented to the emergency department with with nausea and vomiting and subsequently was admitted to the hospital with seizure and Colton Zhang developed acute respiratory failure again and was hospitalized until around May of this year Colton Zhang had several ultrasound venous Doppler of both arms and legs performed in the hospital; they are all negative for DVT until ultrasound venous Doppler dated 09/09/2023 showed totally occluding acute deep vein thrombosis of the left femoral vein.  The patient was placed on Lovenox .  Due to interaction with other medications, oral DOAC was not an option for him  His brother administer Lovenox  to the patient. The patient denies any recent signs or symptoms of bleeding such as spontaneous epistaxis, hematuria or hematochezia. Since discharge from the hospital in May, Colton Zhang is physically active.  Unfortunately, Colton Zhang injured his ankle in early May.  MRI showed partial-thickness tear of the anterior talofibular ligament.  Colton Zhang was placed on a boot. Colton Zhang does not smoke Repeat venous Doppler ultrasound dated December 09, 2023  showed findings consistent with chronic deep vein thrombosis involving the left femoral vein. Minimal flow noted in the proximal femoral vein, partially recanalized flow noted in the mid femoral vein.

## 2023-12-19 NOTE — Assessment & Plan Note (Addendum)
 His recent left lower extremity DVT is provoked by severe illness and prolonged hospitalization He needs to be on anticoagulation therapy for minimum of 3 months Recent repeat venous Doppler ultrasound showed near complete resolution of DVT on the left with some mild chronic changes seen He will place Lovenox  on hold in anticipation for future lumbar puncture After that, he will resume taking Lovenox  the following day and to complete the rest of his prescription He does not need further refills We discussed risk factors associated with recurrent DVT including dehydration and prolonged immobility I will see him in the future for perioperative anticoagulation management

## 2023-12-22 ENCOUNTER — Ambulatory Visit: Admitting: Occupational Therapy

## 2023-12-23 ENCOUNTER — Ambulatory Visit: Admitting: Occupational Therapy

## 2023-12-24 ENCOUNTER — Ambulatory Visit: Admitting: Speech Pathology

## 2023-12-31 ENCOUNTER — Ambulatory Visit: Attending: Physical Medicine & Rehabilitation | Admitting: Speech Pathology

## 2023-12-31 ENCOUNTER — Encounter: Payer: Self-pay | Admitting: Speech Pathology

## 2023-12-31 DIAGNOSIS — R41841 Cognitive communication deficit: Secondary | ICD-10-CM | POA: Insufficient documentation

## 2023-12-31 NOTE — Therapy (Unsigned)
 OUTPATIENT SPEECH LANGUAGE PATHOLOGY TREATMENT & RECERTIFICATION   Patient Name: Colton Zhang MRN: [id number] DOB:Nov 18, 2000, 24 y.o., male Today's Date: 12/31/2023  PCP: None Listed REFERRING PROVIDER: Therisa Rush, MD  END OF SESSION:  End of Session - 12/31/23 1108     Visit Number 9    Date for SLP Re-Evaluation 02/11/24    Authorization - Visit Number 2    Authorization - Number of Visits 9    SLP Start Time 1105    SLP Stop Time  1145    SLP Time Calculation (min) 40 min    Activity Tolerance Patient tolerated treatment well          Past Medical History:  Diagnosis Date   ADD (attention deficit disorder)    Asthma    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    Past Surgical History:  Procedure Laterality Date   CIRCUMCISION     GASTROSTOMY W/ FEEDING TUBE     TRACHEOSTOMY     Patient Active Problem List   Diagnosis Date Noted   Acute deep vein thrombosis (DVT) of left lower extremity (HCC) 11/10/2023   Acute hypoxic respiratory failure (HCC) 11/25/2022   Status epilepticus (HCC) 11/25/2022   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   Pt accompanied by (@eval ): self and family member; Nutritional therapist (brother)  PERTINENT HISTORY: Per chart review: Colton Zhang is a 23 y.o. 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   PAIN:  Are you having pain? No   FALLS: Has patient fallen in last 6 months?  No  LIVING ENVIRONMENT: Lives with: lives with their family (son) Lives in: House/apartment  PLOF:  Level of assistance: Independent with ADLs, Independent with IADLs Employment: Environmental education officer employment; [employer]   Completed High School   PATIENT GOALS:   OBJECTIVE:  Note: Objective measures were completed at Evaluation unless otherwise noted.  DIAGNOSTIC FINDINGS: Per chart review (most recent):  CT Head Wo Contrast 07/31/23 IMPRESSION: No acute intracranial findings     Electronically Signed   By: Jackquline Boxer M.D.   On: 07/31/2023 16:16  COGNITION: Overall cognitive status: Impaired Areas of impairment:  Attention: Impaired: Alternating, Divided Memory: Impaired: Short term Prospective Awareness: Impaired: Emergent Executive function: Impaired: Organization, Planning, Error awareness, and Self-correction Functional deficits: Pt reports he feels back to baseline. Brother reports seeing some impairments at home re: forgetting information  COGNITIVE COMMUNICATION: Following directions: Follows multi-step commands inconsistently  Auditory comprehension: Impaired: suspect poor attention is contributing Verbal expression: WFL Functional communication: Impaired: Brother reports pt forgetting details of instructions/conversations  ORAL MOTOR EXAMINATION: Overall status: WFL Comments:   Pt with capped TRACH and PEG tube. Voice WNL at this time. Reports no difficulty with swallowing at this time and on a regular diet. To reassess if changes post trach removal.  STANDARDIZED ASSESSMENTS:  Cognitive Linguistic Quick Test: AGE - 18 - 69  The Cognitive Linguistic Quick Test (CLQT) was administered to assess the relative status of five cognitive domains: attention, memory, language, executive functioning, and visuospatial skills. Scores  from 10 tasks were used to estimate severity ratings (standardized for age groups 18-69 years and 70-89 years) for each domain, a clock drawing task, as well as an overall composite severity rating of cognition.       Task Score Criterion Cut Scores  Personal Facts 8/8 8  Symbol Cancellation 12/12 11  Confrontation Naming 10/10 10  Clock Drawing  10/13 12  Story Retelling 6/10 6  Symbol Trails 4/10 9  Generative Naming 4/9 5  Design Memory 6/6 5  Mazes  6/8 7  Design Generation 2/13 6    Cognitive Domain Composite Score Severity Rating  Attention 170/215 Mild  Memory 156/185 WNL  Executive Function 16/40 Moderate  Language 28/37 Mild  Visuospatial Skills 76/105 Mild  Clock Drawing  10/13 Mild  Composite Severity Rating  Mild       PATIENT REPORTED OUTCOME MEASURES (PROM): Cognitive Function: 112                                                                                                                            TREATMENT DATE:   12/31/23: Pt was seen for skilled ST services targeting cognitive-communication. Pt reports he was doing well with implementing his routine until his recent spinal tap where he was in pain and laying down more. He reported they spoke with doctor and changed medication to reduce daytime drowsiness. Pt reports on the week he completed his sleep/wake routine, he felt somewhat better. Pt reports he plans to return to work in late October/early November. Pt is currently managing his own medication - initially, he reports that it was difficult reading instructions, but now he knows his medications and instructions and doesn't have to rely on reading. SLP facilitated session by instructing patient to complete an organizational, medication error awareness task. Pt was able to identify errors independently in a 2,3, 4 pill task. On 6 pill task, pt made 1 error. He corrected with minA verbal cues from therapist.  12/11/23: Pt was seen for skilled ST  services  targeting education and pt barriers to follow through with strategies at home. Pt brought brother today per SLP written request. Brother reports pt has been taking over his medication with supervision. SLP reviewed concerns and previous conversations had with pt brother. Brother was not fully aware of neuropsych recommendations - SLP showed him where SLP had printed out paperwork and simplified recommendations for pt. Brother is to review. Pt continues to struggle with awareness of deficits which is a barrier to treatment. SLP educated on the importance of keeping a routine at home and promote better sleep hygiene. SLP collaborated with brother and pt to create a daily schedule. Pt confirmed he will attempt schedule for 1 week to see if this helps with fatigue. Brother is also to speak with doctor about  changing schedule for depakote, as he takes this at 2p and this makes him very tired. Brother to attend future sessions. SLP to recert for 9 additional visits. If pt is not making an effort to implement strategies, he will be discharged. Pt and brother verbalized understanding.   11/25/23: Pt was seen for skilled ST services targeting cognitive-communication. SLP had long discussion with patient regarding motivation and awareness of deficits. Pt could not recall any recommendations from neuropsych evaluation. 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 This obviously has impacted his ability to follow written directions significantly.  SLP printed evaluation, reviewed recommendations, and wrote them less complex language into a word document. Pt was able to read document with minA. Pt is to focus on navigating sleep schedule - going to bed around 1am vs 3am to improve sleep hygiene. To provide compensations for reading.   11/21/23: Pt was seen for skilled ST services targeting  cognitive-communication. Pt was late to today's session. He did return completed HEP task with 2 errors re: counting states on a data sheet. Pt is not carrying over strategy use at home and does not appear to be invested in learning cognitive strategies. SLP facilitated today's session using real life cognitive task re: identifying which bank checking account option would be best. Pt required modA to complete. Pt continues to deflect discussions of deficits with humor - which tends to mask his impairments. Pt continues to struggle with mildly complex tasks. SLP to discuss discharge next session if pt is not feeling motivated to complete therapy at this time.   11/11/23: Pt was seen for skilled ST services targeting cognitive-communication. Pt continues to demonstrate little awareness of cognitive challenges, though SLP suspects this is because he is not being challenged at home. SLP facilitated session by instructing pt to complete task requiring attention and executive functioning skills. Pt required mod-maxA to complete. Pt provided excuses as to why this was challenging. SLP demonstrated how pt could complete each task using an organizational strategy - breaking into steps, highlighting, and provided similarities between this task and his work. Pt to complete task for homework. To continue working on relevant tasks to encourage acknowledgement of difficulties to increase strategy use.   10/17/23: Pt was seen for skilled ST services targeting cognitive-communication. Pt forgot his folder today, but has tried to implement a better sleep routine. Pt continues to feel like he doesn't have many deficits, but when asked, he does feel like processing is slowed. SLP completed education on attention strategies. To complete higher level executive functioning tasks to increase pt awareness of impairments.   10/08/23: Pt was seen for skilled ST services targeting cognitive-communication. SLP initiated education on  attention strategies; though, pt reports he doesn't have much difficulty with attention (even with ADD dx & performance on standardized test). SLP encouraged pt to remain aware and attempt to identify what we are talking about when he is at home. Currently, pt reports poor sleep schedule. SLP suggested creating a routine to remain more consistent and decrease fatigue during the day. To cont .   10/02/23: Pt was seen for skilled ST services targeting continued assessment. Pt completed CLQT and Cognitive PROMs. SLP reviewed results with pt. No questions at this time. To begin with attention strategies next session.    PATIENT EDUCATION: Education details: Cognitive-communication and SLP role Person educated: Patient and brother Education method:  Explanation Education comprehension: verbalized understanding and needs further education   GOALS: Goals reviewed with patient? Yes  SHORT TERM GOALS: Target date: 11/01/23  Complete PROMs Baseline: Goal status: NOT MET  2.  Complete CLQT and update goals Baseline:  Goal status:MET  3.  Pt will verbalize 3 memory strategies to be used in recall of important information Baseline:  Goal status: NOT MET   4.  Pt will verbalize 3 strategies to increase focus on cognitive tasks Baseline:  Goal status: NOT MET  LONG TERM GOALS: Target date: 12/01/23  Improve score on PROMs Baseline:  Goal status: NOT MET  2.  Pt will report successful use of memory strategies at home Baseline:  Goal status: NOT MET   3.  Pt will report successful use of attention strategies at home Baseline:  Goal status: NOT MET  ASSESSMENT:  CLINICAL IMPRESSION: Pt is a 23 yo male who presents to ST OP for evaluation with complex medical history, specifically autoimmune encephalitis and seizures. See tx note. SLP to recert for additional visits to further progress towards treatment strategy implementation. SLP rec skilled ST services to address cognitive-communication  impairment to maximize functional independence.    OBJECTIVE IMPAIRMENTS: include attention, memory, awareness, and executive functioning. These impairments are limiting patient from return to work, managing medications, managing appointments, managing finances, household responsibilities, and effectively communicating at home and in community. Factors affecting potential to achieve goals and functional outcome are awareness of impairments.. Patient will benefit from skilled SLP services to address above impairments and improve overall function.  REHAB POTENTIAL: Good  PLAN:  SLP FREQUENCY: 1-2x/week  SLP DURATION: 8 weeks  PLANNED INTERVENTIONS: Environmental controls, Cueing hierachy, Internal/external aids, Functional tasks, SLP instruction and feedback, Compensatory strategies, Patient/family education, and [id number] Treatment of speech (30 or 45 min)     [employer], CCC-SLP 12/31/2023, 11:09 AM

## 2024-01-07 ENCOUNTER — Encounter: Payer: Self-pay | Admitting: Speech Pathology

## 2024-01-07 ENCOUNTER — Ambulatory Visit: Admitting: Speech Pathology

## 2024-01-07 DIAGNOSIS — R41841 Cognitive communication deficit: Secondary | ICD-10-CM

## 2024-01-07 NOTE — Therapy (Signed)
 OUTPATIENT SPEECH LANGUAGE PATHOLOGY TREATMENT   Patient Name: Colton Zhang MRN: [id number] DOB:[birth year], 23 y.o., male Today's Date: 01/07/2024  PCP: None Listed REFERRING PROVIDER: Therisa Rush, MD  END OF SESSION:  End of Session - 01/07/24 1234     Visit Number 10    Date for SLP Re-Evaluation 02/11/24    Authorization - Visit Number 3    Authorization - Number of Visits 9    SLP Start Time 1230    SLP Stop Time  1310    SLP Time Calculation (min) 40 min    Activity Tolerance Patient tolerated treatment well          Past Medical History:  Diagnosis Date   ADD (attention deficit disorder)    Asthma    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    Past Surgical History:  Procedure Laterality Date   CIRCUMCISION     GASTROSTOMY W/ FEEDING TUBE     TRACHEOSTOMY     Patient Active Problem List   Diagnosis Date Noted   Acute deep vein thrombosis (DVT) of left lower extremity (HCC) 11/10/2023   Acute hypoxic respiratory failure (HCC) 11/25/2022   Status epilepticus (HCC) 11/25/2022   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   Asthma, chronic 11/18/2022   Hypertensive urgency 11/18/2022   Headache 09/16/2013   Migraine without aura 09/16/2013   ADD (attention deficit disorder) 09/16/2013   Transient alteration of awareness 09/16/2013    ONSET DATE: Referred on 09/23/23  REFERRING DIAG: R41.89 (ICD-10-CM) - Other symptoms and signs involving cognitive functions and awareness   THERAPY DIAG:  Cognitive communication deficit  Rationale for Evaluation and Treatment: Rehabilitation  SUBJECTIVE:   SUBJECTIVE STATEMENT:  Nothing really   Pt accompanied by (@eval ): self and family member; Nutritional therapist (brother)  PERTINENT HISTORY: Per chart review: Colton Zhang is a 23 y.o. 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   PAIN:   Are you having pain? No   FALLS: Has patient fallen in last 6 months?  No  LIVING ENVIRONMENT: Lives with: lives with their family (son) Lives in: House/apartment  PLOF:  Level of assistance: Independent with ADLs, Independent with IADLs Employment: Environmental education officer employment; [employer]   Completed High School   PATIENT GOALS:   OBJECTIVE:  Note: Objective measures were completed at Evaluation unless otherwise noted.  DIAGNOSTIC FINDINGS: Per chart review (most recent):  CT Head Wo Contrast 07/31/23 IMPRESSION: No acute intracranial findings     Electronically Signed   By: Jackquline Boxer M.D.   On: 07/31/2023 16:16  COGNITION: Overall cognitive status: Impaired Areas of impairment:  Attention: Impaired: Alternating, Divided Memory: Impaired: Short term Prospective Awareness: Impaired: Emergent Executive function: Impaired: Organization, Planning, Error awareness, and Self-correction Functional deficits: Pt reports he feels back to baseline. Brother reports seeing some impairments at home re: forgetting information  COGNITIVE COMMUNICATION: Following directions: Follows multi-step commands inconsistently  Auditory comprehension: Impaired: suspect poor attention is contributing Verbal expression: WFL Functional communication: Impaired: Brother reports pt forgetting details of instructions/conversations  ORAL MOTOR EXAMINATION: Overall status: WFL Comments:   Pt with capped TRACH and PEG tube. Voice WNL at this time. Reports no difficulty with swallowing at this time and on a regular diet. To reassess if changes post trach removal.  STANDARDIZED ASSESSMENTS:  Cognitive Linguistic Quick Test: AGE - 18 - 69   The Cognitive Linguistic Quick Test (CLQT) was administered to  assess the relative status of five cognitive domains: attention, memory, language, executive functioning, and visuospatial skills. Scores from 10 tasks were used to estimate severity ratings  (standardized for age groups 18-69 years and 70-89 years) for each domain, a clock drawing task, as well as an overall composite severity rating of cognition.       Task Score Criterion Cut Scores  Personal Facts 8/8 8  Symbol Cancellation 12/12 11  Confrontation Naming 10/10 10  Clock Drawing  10/13 12  Story Retelling 6/10 6  Symbol Trails 4/10 9  Generative Naming 4/9 5  Design Memory 6/6 5  Mazes  6/8 7  Design Generation 2/13 6    Cognitive Domain Composite Score Severity Rating  Attention 170/215 Mild  Memory 156/185 WNL  Executive Function 16/40 Moderate  Language 28/37 Mild  Visuospatial Skills 76/105 Mild  Clock Drawing  10/13 Mild  Composite Severity Rating  Mild       PATIENT REPORTED OUTCOME MEASURES (PROM): Cognitive Function: 112                                                                                                                            TREATMENT DATE:   01/07/24: Pt was seen for skilled ST services targeting cognitive-communication. Pt reports he has been waking up for the day at 8am, taking a nap from 2-4p, and then staying up until 3am. SLP reviewed attention strategies today, as pt was unable to recall ever discussing strategies. SLP facilitated recall by encouraging pt to repeat and read what he was able. SLP also utilized visuals/pictures to promote recall. Pt required minA cues for redirection. SLP educated on signs that he was starting to lose focus and encouraged him to be aware of these so he is able to gently shift attention back OR ask for repetition. Pt was instructed to review attention strategies at home for HEP.  12/31/23: Pt was seen for skilled ST services targeting cognitive-communication. Pt reports he was doing well with implementing his routine until his recent spinal tap where he was in pain and laying down more. He reported they spoke with doctor and changed medication to reduce daytime drowsiness. Pt reports on the week he  completed his sleep/wake routine, he felt somewhat better. Pt reports he plans to return to work in late October/early November. Pt is currently managing his own medication - initially, he reports that it was difficult reading instructions, but now he knows his medications and instructions and doesn't have to rely on reading. SLP facilitated session by instructing patient to complete an organizational, medication error awareness task. Pt was able to identify errors independently in a 2,3, 4 pill task. On 6 pill task, pt made 1 error. He corrected with minA verbal cues from therapist.  12/11/23: Pt was seen for skilled ST  services targeting education and pt barriers to follow through with strategies at home. Pt brought brother today per SLP written request. Brother reports pt has  been taking over his medication with supervision. SLP reviewed concerns and previous conversations had with pt brother. Brother was not fully aware of neuropsych recommendations - SLP showed him where SLP had printed out paperwork and simplified recommendations for pt. Brother is to review. Pt continues to struggle with awareness of deficits which is a barrier to treatment. SLP educated on the importance of keeping a routine at home and promote better sleep hygiene. SLP collaborated with brother and pt to create a daily schedule. Pt confirmed he will attempt schedule for 1 week to see if this helps with fatigue. Brother is also to speak with doctor about changing schedule for depakote, as he takes this at 2p and this makes him very tired. Brother to attend future sessions. SLP to recert for 9 additional visits. If pt is not making an effort to implement strategies, he will be discharged. Pt and brother verbalized understanding.   11/25/23: Pt was seen for skilled ST services targeting cognitive-communication. SLP had long discussion with patient regarding motivation and awareness of deficits. Pt could not recall any recommendations from  neuropsych evaluation. 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 This obviously has impacted his ability to follow written directions significantly.  SLP printed evaluation, reviewed recommendations, and wrote them less complex language into a word document. Pt was able to read document with minA. Pt is to focus on navigating sleep schedule - going to bed around 1am vs 3am to improve sleep hygiene. To provide compensations for reading.   11/21/23: Pt was seen for skilled ST services targeting cognitive-communication. Pt was late to today's session. He did return completed HEP task with 2 errors re: counting states on a data sheet. Pt is not carrying over strategy use at home and does not appear to be invested in learning cognitive strategies. SLP facilitated today's session using real life cognitive task re: identifying which bank checking account option would be best. Pt required modA to complete. Pt continues to deflect discussions of deficits with humor - which tends to mask his impairments. Pt continues to struggle with mildly complex tasks. SLP to discuss discharge next session if pt is not feeling motivated to complete therapy at this time.   11/11/23: Pt was seen for skilled ST services targeting cognitive-communication. Pt continues to demonstrate little awareness of cognitive challenges, though SLP suspects this is because he is not being challenged at home. SLP facilitated session by instructing pt to complete task requiring attention and executive functioning skills. Pt required mod-maxA to complete. Pt provided excuses as to why this was challenging. SLP demonstrated how pt could complete each task using an organizational strategy - breaking into steps, highlighting, and provided similarities between this task and his work. Pt to complete task for homework. To  continue working on relevant tasks to encourage acknowledgement of difficulties to increase strategy use.   10/17/23: Pt was seen for skilled ST services targeting cognitive-communication. Pt forgot his folder today, but has tried to implement a better sleep routine. Pt continues to feel like he doesn't have many deficits, but when asked, he does feel like processing is slowed. SLP completed education on attention strategies. To complete higher level executive functioning tasks to increase pt awareness of impairments.   10/08/23: Pt was seen for skilled ST services targeting cognitive-communication. SLP initiated education on attention strategies; though, pt reports he doesn't have much difficulty  with attention (even with ADD dx & performance on standardized test). SLP encouraged pt to remain aware and attempt to identify what we are talking about when he is at home. Currently, pt reports poor sleep schedule. SLP suggested creating a routine to remain more consistent and decrease fatigue during the day. To cont .   10/02/23: Pt was seen for skilled ST services targeting continued assessment. Pt completed CLQT and Cognitive PROMs. SLP reviewed results with pt. No questions at this time. To begin with attention strategies next session.    PATIENT EDUCATION: Education details: Cognitive-communication and SLP role Person educated: Patient and brother Education method: Explanation Education comprehension: verbalized understanding and needs further education   GOALS: Goals reviewed with patient? Yes  SHORT TERM GOALS: Target date: 11/01/23  Complete PROMs Baseline: Goal status: NOT MET  2.  Complete CLQT and update goals Baseline:  Goal status:MET  3.  Pt will verbalize 3 memory strategies to be used in recall of important information Baseline:  Goal status: NOT MET   4.  Pt will verbalize 3 strategies to increase focus on cognitive tasks Baseline:  Goal status: NOT MET  LONG TERM GOALS:  Target date: 12/01/23  Improve score on PROMs Baseline:  Goal status: NOT MET  2.  Pt will report successful use of memory strategies at home Baseline:  Goal status: NOT MET   3.  Pt will report successful use of attention strategies at home Baseline:  Goal status: NOT MET  ASSESSMENT:  CLINICAL IMPRESSION: Pt is a 23 yo male who presents to ST OP for evaluation with complex medical history, specifically autoimmune encephalitis and seizures. See tx note. SLP to recert for additional visits to further progress towards treatment strategy implementation. SLP rec skilled ST services to address cognitive-communication impairment to maximize functional independence.    OBJECTIVE IMPAIRMENTS: include attention, memory, awareness, and executive functioning. These impairments are limiting patient from return to work, managing medications, managing appointments, managing finances, household responsibilities, and effectively communicating at home and in community. Factors affecting potential to achieve goals and functional outcome are awareness of impairments.. Patient will benefit from skilled SLP services to address above impairments and improve overall function.  REHAB POTENTIAL: Good  PLAN:  SLP FREQUENCY: 1-2x/week  SLP DURATION: 8 weeks  PLANNED INTERVENTIONS: Environmental controls, Cueing hierachy, Internal/external aids, Functional tasks, SLP instruction and feedback, Compensatory strategies, Patient/family education, and [id number] Treatment of speech (30 or 45 min)     [employer], CCC-SLP 01/07/2024, 12:34 PM

## 2024-01-14 ENCOUNTER — Ambulatory Visit: Admitting: Speech Pathology

## 2024-01-21 ENCOUNTER — Ambulatory Visit: Admitting: Speech Pathology

## 2024-01-21 ENCOUNTER — Encounter: Payer: Self-pay | Admitting: Speech Pathology

## 2024-01-21 DIAGNOSIS — R41841 Cognitive communication deficit: Secondary | ICD-10-CM

## 2024-01-21 NOTE — Therapy (Unsigned)
 OUTPATIENT SPEECH LANGUAGE PATHOLOGY TREATMENT   Patient Name: Colton Zhang MRN: [id number] DOB:2000-07-04, 23 y.o., male Today's Date: 01/21/2024  PCP: None Listed REFERRING PROVIDER: Therisa Rush, MD  END OF SESSION:  End of Session - 01/21/24 1236     Visit Number 11    Date for SLP Re-Evaluation 02/11/24    Authorization - Visit Number 4    Authorization - Number of Visits 9    SLP Start Time 1230    SLP Stop Time  1310    SLP Time Calculation (min) 40 min    Activity Tolerance Patient tolerated treatment well          Past Medical History:  Diagnosis Date   ADD (attention deficit disorder)    Asthma    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    Past Surgical History:  Procedure Laterality Date   CIRCUMCISION     GASTROSTOMY W/ FEEDING TUBE     TRACHEOSTOMY     Patient Active Problem List   Diagnosis Date Noted   Acute deep vein thrombosis (DVT) of left lower extremity (HCC) 11/10/2023   Acute hypoxic respiratory failure (HCC) 11/25/2022   Status epilepticus (HCC) 11/25/2022   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   Asthma, chronic 11/18/2022   Hypertensive urgency 11/18/2022   Headache 09/16/2013   Migraine without aura 09/16/2013   ADD (attention deficit disorder) 09/16/2013   Transient alteration of awareness 09/16/2013    ONSET DATE: Referred on 09/23/23  REFERRING DIAG: R41.89 (ICD-10-CM) - Other symptoms and signs involving cognitive functions and awareness   THERAPY DIAG:  Cognitive communication deficit  Rationale for Evaluation and Treatment: Rehabilitation  SUBJECTIVE:   SUBJECTIVE STATEMENT:  Nothing really   Pt accompanied by (@eval ): self and family member; Nutritional therapist (brother)  PERTINENT HISTORY: Per chart review: UZIAH SORTER is a 23 y.o. 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   PAIN:   Are you having pain? No   FALLS: Has patient fallen in last 6 months?  No  LIVING ENVIRONMENT: Lives with: lives with their family (son) Lives in: House/apartment  PLOF:  Level of assistance: Independent with ADLs, Independent with IADLs Employment: Environmental education officer employment; [employer]   Completed High School   PATIENT GOALS:   OBJECTIVE:  Note: Objective measures were completed at Evaluation unless otherwise noted.  DIAGNOSTIC FINDINGS: Per chart review (most recent):  CT Head Wo Contrast 07/31/23 IMPRESSION: No acute intracranial findings     Electronically Signed   By: Jackquline Boxer M.D.   On: 07/31/2023 16:16  COGNITION: Overall cognitive status: Impaired Areas of impairment:  Attention: Impaired: Alternating, Divided Memory: Impaired: Short term Prospective Awareness: Impaired: Emergent Executive function: Impaired: Organization, Planning, Error awareness, and Self-correction Functional deficits: Pt reports he feels back to baseline. Brother reports seeing some impairments at home re: forgetting information  COGNITIVE COMMUNICATION: Following directions: Follows multi-step commands inconsistently  Auditory comprehension: Impaired: suspect poor attention is contributing Verbal expression: WFL Functional communication: Impaired: Brother reports pt forgetting details of instructions/conversations  ORAL MOTOR EXAMINATION: Overall status: WFL Comments:   Pt with capped TRACH and PEG tube. Voice WNL at this time. Reports no difficulty with swallowing at this time and on a regular diet. To reassess if changes post trach removal.  STANDARDIZED ASSESSMENTS:  Cognitive Linguistic Quick Test: AGE - 18 - 69   The Cognitive Linguistic Quick Test (CLQT) was administered to  assess the relative status of five cognitive domains: attention, memory, language, executive functioning, and visuospatial skills. Scores from 10 tasks were used to estimate severity ratings  (standardized for age groups 18-69 years and 70-89 years) for each domain, a clock drawing task, as well as an overall composite severity rating of cognition.       Task Score Criterion Cut Scores  Personal Facts 8/8 8  Symbol Cancellation 12/12 11  Confrontation Naming 10/10 10  Clock Drawing  10/13 12  Story Retelling 6/10 6  Symbol Trails 4/10 9  Generative Naming 4/9 5  Design Memory 6/6 5  Mazes  6/8 7  Design Generation 2/13 6    Cognitive Domain Composite Score Severity Rating  Attention 170/215 Mild  Memory 156/185 WNL  Executive Function 16/40 Moderate  Language 28/37 Mild  Visuospatial Skills 76/105 Mild  Clock Drawing  10/13 Mild  Composite Severity Rating  Mild       PATIENT REPORTED OUTCOME MEASURES (PROM): Cognitive Function: 112                                                                                                                            TREATMENT DATE:   01/21/24: Pt was seen for skilled ST services targeting cognitive-communication. Pt was able to recall what we discussed last session re: selective attention, divided attention. Pt reports sleep has been good - going to sleep around 2am on average and waking up for the day 8am, with one nap.   01/07/24: Pt was seen for skilled ST services targeting cognitive-communication. Pt reports he has been waking up for the day at 8am, taking a nap from 2-4p, and then staying up until 3am. SLP reviewed attention strategies today, as pt was unable to recall ever discussing strategies. SLP facilitated recall by encouraging pt to repeat and read what he was able. SLP also utilized visuals/pictures to promote recall. Pt required minA cues for redirection. SLP educated on signs that he was starting to lose focus and encouraged him to be aware of these so he is able to gently shift attention back OR ask for repetition. Pt was instructed to review attention strategies at home for HEP.  12/31/23: Pt was seen for skilled ST  services targeting cognitive-communication. Pt reports he was doing well with implementing his routine until his recent spinal tap where he was in pain and laying down more. He reported they spoke with doctor and changed medication to reduce daytime drowsiness. Pt reports on the week he completed his sleep/wake routine, he felt somewhat better. Pt reports he plans to return to work in late October/early November. Pt is currently managing his own medication - initially, he reports that it was difficult reading instructions, but now he knows his medications and instructions and doesn't have to rely on reading. SLP facilitated session by instructing patient to complete an organizational, medication error awareness task. Pt was able to identify errors independently in a 2,3, 4 pill  task. On 6 pill task, pt made 1 error. He corrected with minA verbal cues from therapist.  12/11/23: Pt was seen for skilled ST  services targeting education and pt barriers to follow through with strategies at home. Pt brought brother today per SLP written request. Brother reports pt has been taking over his medication with supervision. SLP reviewed concerns and previous conversations had with pt brother. Brother was not fully aware of neuropsych recommendations - SLP showed him where SLP had printed out paperwork and simplified recommendations for pt. Brother is to review. Pt continues to struggle with awareness of deficits which is a barrier to treatment. SLP educated on the importance of keeping a routine at home and promote better sleep hygiene. SLP collaborated with brother and pt to create a daily schedule. Pt confirmed he will attempt schedule for 1 week to see if this helps with fatigue. Brother is also to speak with doctor about changing schedule for depakote, as he takes this at 2p and this makes him very tired. Brother to attend future sessions. SLP to recert for 9 additional visits. If pt is not making an effort to implement  strategies, he will be discharged. Pt and brother verbalized understanding.   11/25/23: Pt was seen for skilled ST services targeting cognitive-communication. SLP had long discussion with patient regarding motivation and awareness of deficits. Pt could not recall any recommendations from neuropsych evaluation. 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 This obviously has impacted his ability to follow written directions significantly.  SLP printed evaluation, reviewed recommendations, and wrote them less complex language into a word document. Pt was able to read document with minA. Pt is to focus on navigating sleep schedule - going to bed around 1am vs 3am to improve sleep hygiene. To provide compensations for reading.   11/21/23: Pt was seen for skilled ST services targeting cognitive-communication. Pt was late to today's session. He did return completed HEP task with 2 errors re: counting states on a data sheet. Pt is not carrying over strategy use at home and does not appear to be invested in learning cognitive strategies. SLP facilitated today's session using real life cognitive task re: identifying which bank checking account option would be best. Pt required modA to complete. Pt continues to deflect discussions of deficits with humor - which tends to mask his impairments. Pt continues to struggle with mildly complex tasks. SLP to discuss discharge next session if pt is not feeling motivated to complete therapy at this time.   11/11/23: Pt was seen for skilled ST services targeting cognitive-communication. Pt continues to demonstrate little awareness of cognitive challenges, though SLP suspects this is because he is not being challenged at home. SLP facilitated session by instructing pt to complete task requiring attention and executive functioning skills. Pt required  mod-maxA to complete. Pt provided excuses as to why this was challenging. SLP demonstrated how pt could complete each task using an organizational strategy - breaking into steps, highlighting, and provided similarities between this task and his work. Pt to complete task for homework. To continue working on relevant tasks to encourage acknowledgement of difficulties to increase strategy use.   10/17/23: Pt was seen for skilled ST services targeting cognitive-communication. Pt forgot his folder today, but has tried to implement a better sleep routine. Pt continues to feel like he doesn't have many deficits, but when asked, he  does feel like processing is slowed. SLP completed education on attention strategies. To complete higher level executive functioning tasks to increase pt awareness of impairments.   10/08/23: Pt was seen for skilled ST services targeting cognitive-communication. SLP initiated education on attention strategies; though, pt reports he doesn't have much difficulty with attention (even with ADD dx & performance on standardized test). SLP encouraged pt to remain aware and attempt to identify what we are talking about when he is at home. Currently, pt reports poor sleep schedule. SLP suggested creating a routine to remain more consistent and decrease fatigue during the day. To cont .   10/02/23: Pt was seen for skilled ST services targeting continued assessment. Pt completed CLQT and Cognitive PROMs. SLP reviewed results with pt. No questions at this time. To begin with attention strategies next session.    PATIENT EDUCATION: Education details: Cognitive-communication and SLP role Person educated: Patient and brother Education method: Explanation Education comprehension: verbalized understanding and needs further education   GOALS: Goals reviewed with patient? Yes  SHORT TERM GOALS: Target date: 11/01/23  Complete PROMs Baseline: Goal status: NOT MET  2.  Complete CLQT and update  goals Baseline:  Goal status:MET  3.  Pt will verbalize 3 memory strategies to be used in recall of important information Baseline:  Goal status: NOT MET   4.  Pt will verbalize 3 strategies to increase focus on cognitive tasks Baseline:  Goal status: NOT MET  LONG TERM GOALS: Target date: 12/01/23; 02/11/24  Improve score on PROMs Baseline:  Goal status: NOT MET  2.  Pt will report successful use of memory strategies at home Baseline:  Goal status: NOT MET   3.  Pt will report successful use of attention strategies at home Baseline:  Goal status: NOT MET  ASSESSMENT:  CLINICAL IMPRESSION: Pt is a 23 yo male who presents to ST OP for evaluation with complex medical history, specifically autoimmune encephalitis and seizures. See tx note. SLP to recert for additional visits to further progress towards treatment strategy implementation. SLP rec skilled ST services to address cognitive-communication impairment to maximize functional independence.    OBJECTIVE IMPAIRMENTS: include attention, memory, awareness, and executive functioning. These impairments are limiting patient from return to work, managing medications, managing appointments, managing finances, household responsibilities, and effectively communicating at home and in community. Factors affecting potential to achieve goals and functional outcome are awareness of impairments.. Patient will benefit from skilled SLP services to address above impairments and improve overall function.  REHAB POTENTIAL: Good  PLAN:  SLP FREQUENCY: 1-2x/week  SLP DURATION: 8 weeks  PLANNED INTERVENTIONS: Environmental controls, Cueing hierachy, Internal/external aids, Functional tasks, SLP instruction and feedback, Compensatory strategies, Patient/family education, and [id number] Treatment of speech (30 or 45 min)     [employer], CCC-SLP 01/21/2024, 12:36 PM

## 2024-02-06 ENCOUNTER — Encounter: Payer: Self-pay | Admitting: Speech Pathology

## 2024-02-06 ENCOUNTER — Ambulatory Visit: Attending: Physical Medicine & Rehabilitation | Admitting: Speech Pathology

## 2024-02-06 DIAGNOSIS — R41841 Cognitive communication deficit: Secondary | ICD-10-CM | POA: Insufficient documentation

## 2024-02-06 NOTE — Therapy (Signed)
 OUTPATIENT SPEECH LANGUAGE PATHOLOGY TREATMENT   Patient Name: Colton Zhang MRN: [id number] DOB:[birth year], 23 y.o., male Today's Date: 02/06/2024  PCP: None Listed REFERRING PROVIDER: Therisa Rush, MD  END OF SESSION:  End of Session - 02/06/24 1110     Visit Number 12    Date for SLP Re-Evaluation 02/11/24    Authorization - Visit Number 5    Authorization - Number of Visits 9    SLP Start Time 1105    SLP Stop Time  1145    SLP Time Calculation (min) 40 min    Activity Tolerance Patient tolerated treatment well          Past Medical History:  Diagnosis Date   ADD (attention deficit disorder)    Asthma    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    Past Surgical History:  Procedure Laterality Date   CIRCUMCISION     GASTROSTOMY W/ FEEDING TUBE     TRACHEOSTOMY     Patient Active Problem List   Diagnosis Date Noted   Acute deep vein thrombosis (DVT) of left lower extremity (HCC) 11/10/2023   Acute hypoxic respiratory failure (HCC) 11/25/2022   Status epilepticus (HCC) 11/25/2022   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  Pt accompanied by (@eval ): self and family member; Nutritional therapist (brother)  PERTINENT HISTORY: Per chart review: Colton Zhang is a 23 y.o. 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    PAIN:  Are you having pain? No   FALLS: Has patient fallen in last 6 months?  No  LIVING ENVIRONMENT: Lives with: lives with their family (son) Lives in: House/apartment  PLOF:  Level of assistance: Independent with ADLs, Independent with IADLs Employment: Environmental education officer employment; [employer]   Completed High School   PATIENT GOALS:   OBJECTIVE:  Note: Objective measures were completed at Evaluation unless otherwise noted.  DIAGNOSTIC FINDINGS: Per chart review (most recent):  CT Head Wo Contrast 07/31/23 IMPRESSION: No acute intracranial findings     Electronically Signed   By: Jackquline Boxer M.D.   On: 07/31/2023 16:16  COGNITION: Overall cognitive status: Impaired Areas of impairment:  Attention: Impaired: Alternating, Divided Memory: Impaired: Short term Prospective Awareness: Impaired: Emergent Executive function: Impaired: Organization, Planning, Error awareness, and Self-correction Functional deficits: Pt reports he feels back to baseline. Brother reports seeing some impairments at home re: forgetting information  COGNITIVE COMMUNICATION: Following directions: Follows multi-step commands inconsistently  Auditory comprehension: Impaired: suspect poor attention is contributing Verbal expression: WFL Functional communication: Impaired: Brother reports pt forgetting details of instructions/conversations  ORAL MOTOR EXAMINATION: Overall status: WFL Comments:   Pt with capped TRACH and PEG tube. Voice WNL at this time. Reports no difficulty with swallowing at this time and on a regular diet. To reassess if changes post trach removal.  STANDARDIZED ASSESSMENTS:  Cognitive Linguistic Quick Test: AGE - 18 - 69   The Cognitive Linguistic Quick Test (CLQT) was administered  to assess the relative status of five cognitive domains: attention, memory, language, executive functioning, and visuospatial skills. Scores from 10 tasks were used to estimate severity ratings  (standardized for age groups 18-69 years and 70-89 years) for each domain, a clock drawing task, as well as an overall composite severity rating of cognition.       Task Score Criterion Cut Scores  Personal Facts 8/8 8  Symbol Cancellation 12/12 11  Confrontation Naming 10/10 10  Clock Drawing  10/13 12  Story Retelling 6/10 6  Symbol Trails 4/10 9  Generative Naming 4/9 5  Design Memory 6/6 5  Mazes  6/8 7  Design Generation 2/13 6    Cognitive Domain Composite Score Severity Rating  Attention 170/215 Mild  Memory 156/185 WNL  Executive Function 16/40 Moderate  Language 28/37 Mild  Visuospatial Skills 76/105 Mild  Clock Drawing  10/13 Mild  Composite Severity Rating  Mild       PATIENT REPORTED OUTCOME MEASURES (PROM): Cognitive Function: 112                                                                                                                            TREATMENT DATE:   02/06/24: Pt was seen for skilled ST services targeting cognitive-communication. Pt reports he is feeling closer back to baseline. Pt reports he has been thinking about applying to jobs, but hasn't applied yet. Ideally, he would like to return to work in a month or two. Initiated education on memory strategies today. Pt reports he is using his calendar on his phone, notes app, and medication box. He reports he could try to use his notes app and focus on placing items in the same place more often. To continue next session and discuss discharge.   01/21/24: Pt was seen for skilled ST services targeting cognitive-communication. Pt was able to recall some of what we discussed last session re: selective attention, divided attention. Pt reports sleep has been good - going to sleep around 2am on average and waking up for the day 8am, with one nap. SLP educated on active listening skills with patient re: clarification, probing questions, paraphrasing, and summarizing. SLP provided several personally-relevant  examples of how he may use. SLP encouraged repetition throughout session to ensure processing of information and provided handout with examples.   01/07/24: Pt was seen for skilled ST services targeting cognitive-communication. Pt reports he has been waking up for the day at 8am, taking a nap from 2-4p, and then staying up until 3am. SLP reviewed attention strategies today, as pt was unable to recall ever discussing strategies. SLP facilitated recall by encouraging pt to repeat and read what he was able. SLP also utilized visuals/pictures to promote recall. Pt required minA cues for redirection. SLP educated on signs that he was starting to lose focus and encouraged him to be aware of these so he is able to gently shift attention back OR ask for repetition. Pt was instructed  to review attention strategies at home for HEP.  12/31/23: Pt was seen for skilled ST services targeting cognitive-communication. Pt reports he was doing well with implementing his routine until his recent spinal tap where he was in pain and laying down more. He reported they spoke with doctor and changed medication to reduce daytime drowsiness. Pt reports on the week he completed his sleep/wake routine, he felt somewhat better. Pt reports he plans to return to work in late October/early November. Pt is currently managing his own medication - initially, he reports that it was difficult reading instructions, but now he knows his medications and instructions and doesn't have to rely on reading. SLP facilitated session by instructing patient to complete an organizational, medication error awareness task. Pt was able to identify errors independently in a 2,3, 4 pill task. On 6 pill task, pt made 1 error. He corrected with minA verbal cues from therapist.  12/11/23: Pt was seen for skilled ST  services targeting education and pt barriers to follow through with strategies at home. Pt brought brother today per SLP written request. Brother reports pt  has been taking over his medication with supervision. SLP reviewed concerns and previous conversations had with pt brother. Brother was not fully aware of neuropsych recommendations - SLP showed him where SLP had printed out paperwork and simplified recommendations for pt. Brother is to review. Pt continues to struggle with awareness of deficits which is a barrier to treatment. SLP educated on the importance of keeping a routine at home and promote better sleep hygiene. SLP collaborated with brother and pt to create a daily schedule. Pt confirmed he will attempt schedule for 1 week to see if this helps with fatigue. Brother is also to speak with doctor about changing schedule for depakote, as he takes this at 2p and this makes him very tired. Brother to attend future sessions. SLP to recert for 9 additional visits. If pt is not making an effort to implement strategies, he will be discharged. Pt and brother verbalized understanding.   11/25/23: Pt was seen for skilled ST services targeting cognitive-communication. SLP had long discussion with patient regarding motivation and awareness of deficits. Pt could not recall any recommendations from neuropsych evaluation. 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 This obviously has impacted his ability to follow written directions significantly.  SLP printed evaluation, reviewed recommendations, and wrote them less complex language into a word document. Pt was able to read document with minA. Pt is to focus on navigating sleep schedule - going to bed around 1am vs 3am to improve sleep hygiene. To provide compensations for reading.   11/21/23: Pt was seen for skilled ST services targeting cognitive-communication. Pt was late to today's session. He did return completed HEP task with 2 errors re: counting states on a data sheet. Pt is not  carrying over strategy use at home and does not appear to be invested in learning cognitive strategies. SLP facilitated today's session using real life cognitive task re: identifying which bank checking account option would be best. Pt required modA to complete. Pt continues to deflect discussions of deficits with humor - which tends to mask his impairments. Pt continues to struggle with mildly complex tasks. SLP to discuss discharge next session if pt is not feeling motivated to complete therapy at this time.   11/11/23: Pt was seen for skilled ST services  targeting cognitive-communication. Pt continues to demonstrate little awareness of cognitive challenges, though SLP suspects this is because he is not being challenged at home. SLP facilitated session by instructing pt to complete task requiring attention and executive functioning skills. Pt required mod-maxA to complete. Pt provided excuses as to why this was challenging. SLP demonstrated how pt could complete each task using an organizational strategy - breaking into steps, highlighting, and provided similarities between this task and his work. Pt to complete task for homework. To continue working on relevant tasks to encourage acknowledgement of difficulties to increase strategy use.   10/17/23: Pt was seen for skilled ST services targeting cognitive-communication. Pt forgot his folder today, but has tried to implement a better sleep routine. Pt continues to feel like he doesn't have many deficits, but when asked, he does feel like processing is slowed. SLP completed education on attention strategies. To complete higher level executive functioning tasks to increase pt awareness of impairments.   10/08/23: Pt was seen for skilled ST services targeting cognitive-communication. SLP initiated education on attention strategies; though, pt reports he doesn't have much difficulty with attention (even with ADD dx & performance on standardized test). SLP  encouraged pt to remain aware and attempt to identify what we are talking about when he is at home. Currently, pt reports poor sleep schedule. SLP suggested creating a routine to remain more consistent and decrease fatigue during the day. To cont .   10/02/23: Pt was seen for skilled ST services targeting continued assessment. Pt completed CLQT and Cognitive PROMs. SLP reviewed results with pt. No questions at this time. To begin with attention strategies next session.    PATIENT EDUCATION: Education details: Cognitive-communication and SLP role Person educated: Patient and brother Education method: Explanation Education comprehension: verbalized understanding and needs further education   GOALS: Goals reviewed with patient? Yes  SHORT TERM GOALS: Target date: 11/01/23  Complete PROMs Baseline: Goal status: NOT MET  2.  Complete CLQT and update goals Baseline:  Goal status:MET  3.  Pt will verbalize 3 memory strategies to be used in recall of important information Baseline:  Goal status: NOT MET   4.  Pt will verbalize 3 strategies to increase focus on cognitive tasks Baseline:  Goal status: NOT MET  LONG TERM GOALS: Target date: 12/01/23; 02/11/24  Improve score on PROMs Baseline:  Goal status: NOT MET  2.  Pt will report successful use of memory strategies at home Baseline:  Goal status: NOT MET   3.  Pt will report successful use of attention strategies at home Baseline:  Goal status: NOT MET  ASSESSMENT:  CLINICAL IMPRESSION: Pt is a 23 yo male who presents to ST OP for evaluation with complex medical history, specifically autoimmune encephalitis and seizures. See tx note. SLP to recert for additional visits to further progress towards treatment strategy implementation. SLP rec skilled ST services to address cognitive-communication impairment to maximize functional independence.    OBJECTIVE IMPAIRMENTS: include attention, memory, awareness, and executive  functioning. These impairments are limiting patient from return to work, managing medications, managing appointments, managing finances, household responsibilities, and effectively communicating at home and in community. Factors affecting potential to achieve goals and functional outcome are awareness of impairments.. Patient will benefit from skilled SLP services to address above impairments and improve overall function.  REHAB POTENTIAL: Good  PLAN:  SLP FREQUENCY: 1-2x/week  SLP DURATION: 8 weeks  PLANNED INTERVENTIONS: Environmental controls, Cueing hierachy, Internal/external aids, Functional tasks, SLP instruction and feedback, Compensatory  strategies, Patient/family education, and [id number] Treatment of speech (30 or 45 min)     [employer], CCC-SLP 02/06/2024, 11:11 AM

## 2024-02-12 ENCOUNTER — Ambulatory Visit: Admitting: Speech Pathology
